# Patient Record
Sex: Female | Born: 1960
Health system: Southern US, Community
[De-identification: ages and names within clinical notes are randomized; demographics above are authoritative.]

## PROBLEM LIST (undated history)

## (undated) DIAGNOSIS — E2839 Other primary ovarian failure: Secondary | ICD-10-CM

## (undated) DIAGNOSIS — R0602 Shortness of breath: Secondary | ICD-10-CM

## (undated) DIAGNOSIS — E079 Disorder of thyroid, unspecified: Secondary | ICD-10-CM

## (undated) DIAGNOSIS — K760 Fatty (change of) liver, not elsewhere classified: Secondary | ICD-10-CM

## (undated) DIAGNOSIS — Z780 Asymptomatic menopausal state: Secondary | ICD-10-CM

## (undated) DIAGNOSIS — N390 Urinary tract infection, site not specified: Secondary | ICD-10-CM

## (undated) DIAGNOSIS — T7840XA Allergy, unspecified, initial encounter: Secondary | ICD-10-CM

## (undated) DIAGNOSIS — R7303 Prediabetes: Secondary | ICD-10-CM

## (undated) DIAGNOSIS — N979 Female infertility, unspecified: Secondary | ICD-10-CM

## (undated) DIAGNOSIS — J45909 Unspecified asthma, uncomplicated: Secondary | ICD-10-CM

## (undated) HISTORY — DX: Female infertility, unspecified: N97.9

## (undated) HISTORY — DX: Unspecified asthma, uncomplicated: J45.909

## (undated) HISTORY — DX: Disorder of thyroid, unspecified: E07.9

## (undated) HISTORY — DX: Fatty (change of) liver, not elsewhere classified: K76.0

## (undated) HISTORY — DX: Other primary ovarian failure: E28.39

## (undated) HISTORY — DX: Shortness of breath: R06.02

## (undated) HISTORY — DX: Allergy, unspecified, initial encounter: T78.40XA

## (undated) HISTORY — DX: Asymptomatic menopausal state: Z78.0

## (undated) HISTORY — DX: Urinary tract infection, site not specified: N39.0

## (undated) HISTORY — DX: Prediabetes: R73.03

## (undated) HISTORY — PX: COLONOSCOPY: SHX174

---

## 2009-10-24 ENCOUNTER — Ambulatory Visit: Payer: Self-pay | Admitting: Family Medicine

## 2010-03-07 ENCOUNTER — Ambulatory Visit: Payer: Self-pay | Admitting: Gastroenterology

## 2011-01-21 ENCOUNTER — Ambulatory Visit: Payer: Self-pay | Admitting: Family Medicine

## 2011-08-07 ENCOUNTER — Ambulatory Visit (INDEPENDENT_AMBULATORY_CARE_PROVIDER_SITE_OTHER): Payer: BC Managed Care – PPO | Admitting: Internal Medicine

## 2011-08-07 ENCOUNTER — Encounter: Payer: Self-pay | Admitting: Internal Medicine

## 2011-08-07 VITALS — BP 118/60 | HR 67 | Temp 98.1°F | Resp 16 | Ht 63.5 in | Wt 185.2 lb

## 2011-08-07 DIAGNOSIS — E669 Obesity, unspecified: Secondary | ICD-10-CM

## 2011-08-07 DIAGNOSIS — D709 Neutropenia, unspecified: Secondary | ICD-10-CM | POA: Insufficient documentation

## 2011-08-07 DIAGNOSIS — E039 Hypothyroidism, unspecified: Secondary | ICD-10-CM

## 2011-08-07 NOTE — Progress Notes (Signed)
Patient ID: Samantha Raymond, female   DOB: [birth year], 51 y.o.   MRN: [id number] Patient Active Problem List  Diagnosis  . Neutropenia  . Recurrent urinary tract infection  . Obesity (BMI 30-39.9)  . Hypothyroidism    Subjective:  CC:   Chief Complaint  Patient presents with  . New Patient    HPI:   Samantha Raymond is a 51 y.o. female who presents as a new patient to establish primary care with the chief complaint of   Past Medical History  Diagnosis Date  . hypothyroid   . Recurrent urinary tract infection     History reviewed. No pertinent past surgical history.  Family History  Problem Relation Age of Onset  . Asthma Mother   . Diabetes Father   . Heart disease Father     chf,  no history of AMI  . Hyperlipidemia Father   . Cancer Sister 18    breast cancer   . Arthritis Sister   . Asthma Brother   . Stroke Maternal Aunt   . Cancer Maternal Aunt     breast ca    History   Social History  . Marital Status: Married    Spouse Name: N/A    Number of Children: N/A  . Years of Education: N/A   Occupational History  . Not on file.   Social History Main Topics  . Smoking status: Never Smoker   . Smokeless tobacco: Never Used  . Alcohol Use: Yes  . Drug Use: No  . Sexually Active: Not on file   Other Topics Concern  . Not on file   Social History Narrative  . No narrative on file    Allergies  Allergen Reactions  . Sulfa Antibiotics Nausea And Vomiting   Review of Systems: Review of Systems - General ROS: negative except for those mentioned in HPI   Objective:  BP 118/60  Pulse 67  Temp 98.1 F (36.7 C) (Oral)  Resp 16  Ht 5' 3.5" (1.613 m)  Wt 185 lb 4 oz (84.029 kg)  BMI 32.30 kg/m2  SpO2 97%  General appearance: alert, cooperative and appears stated age Ears: normal TM's and external ear canals both ears Throat: lips, mucosa, and tongue normal; teeth and gums normal Neck: no adenopathy, no carotid bruit, supple, symmetrical,  trachea midline and thyroid not enlarged, symmetric, no tenderness/mass/nodules Back: symmetric, no curvature. ROM normal. No CVA tenderness. Lungs: clear to auscultation bilaterally Heart: regular rate and rhythm, S1, S2 normal, no murmur, click, rub or gallop Abdomen: soft, non-tender; bowel sounds normal; no masses,  no organomegaly Pulses: 2+ and symmetric Skin: Skin color, texture, turgor normal. No rashes or lesions Lymph nodes: Cervical, supraclavicular, and axillary nodes normal.  Assessment and Plan:  Obesity (BMI 30-39.9) I have addressed  BMI and recommended a low glycemic index diet utilizing smaller more frequent meals to increase metabolism.  I have also recommended that patient start exercising with a goal of 30 minutes of aerobic exercise a minimum of 5 days per week. Screening for lipid disorders, thyroid and diabetes  Has been done per review of prior labs.    Neutropenia New onset, in the setting of several bacterial infections from march through April .  Review of previous records from [place] a low normal pre infectious CBC and an inappropriately normal during an episode of diverticulitis.  We discussed repeating a CBC in a few weeks to cinform neutropenia and if low will consider checking Ig levels.  Updated Medication List Outpatient Encounter Prescriptions as of 08/07/2011  Medication Sig Dispense Refill  . Calcium-Vitamin D-Vitamin K (VIACTIV PO) Take by mouth.      . escitalopram (LEXAPRO) 10 MG tablet Take 10 mg by mouth daily.      Marland Kitchen levothyroxine (SYNTHROID, LEVOTHROID) 50 MCG tablet Take 50 mcg by mouth daily. 1/2 tablet on Saturday and Sunday      . Multiple Vitamin (MULTIVITAMIN) tablet Take 1 tablet by mouth daily.         Orders Placed This Encounter  Procedures  . HM MAMMOGRAPHY  . HM COLONOSCOPY    No Follow-up on file.

## 2011-08-07 NOTE — Patient Instructions (Addendum)
Consider the Low Glycemic Index/Mediterranean  Diet and 6 smaller meals daily .  This boosts your metabolism and regulates your sugars:   7 AM Low carbohydrate Protein  Shakes (EAS Carb Control  Or Atkins ,  Available everywhere,   In  cases at BJs )  2.5 carbs  (Add or substitute a toasted sandwhich thin w/ peanut butter)  10 AM: Protein bar by Atkins (snack size,  Chocolate lover's variety at  BJ's)    Lunch: sandwich on pita bread or flatbread (Joseph's makes a pita bread and a flat bread , available at [employer] and BJ's; Toufayah makes a low carb flatbread available at [employer] and [employer]) Mission makes a low carb whole wheat tortilla available at [employer] most grocery stores   3 PM:  Mid day :  Another protein bar,  Or a  cheese stick, 1/4 cup of almonds, walnuts, pistachios, pecans, peanuts,  Macadamia nuts  6 PM  Dinner:  "mean and green:"  Meat/chicken/fish, salad, and green veggie : use ranch, vinagrette,  Blue cheese, etc  9 PM snack : Breyer's low carb fudgsicle or  ice cream bar (Carb Smart), or  Weight Watcher's ice cream bar , or another protein shake  [place] yogurt , as long as it is is < 20 calories,, is a great substitute  ____________________________________________________________________________________________________-----  We should recheck a CBC and a TSH in early July.

## 2011-08-09 ENCOUNTER — Encounter: Payer: Self-pay | Admitting: Internal Medicine

## 2011-08-09 DIAGNOSIS — E669 Obesity, unspecified: Secondary | ICD-10-CM | POA: Insufficient documentation

## 2011-08-09 DIAGNOSIS — E039 Hypothyroidism, unspecified: Secondary | ICD-10-CM | POA: Insufficient documentation

## 2011-08-09 DIAGNOSIS — N390 Urinary tract infection, site not specified: Secondary | ICD-10-CM | POA: Insufficient documentation

## 2011-08-09 NOTE — Assessment & Plan Note (Signed)
New onset, in the setting of several bacterial infections from march through April .  Review of previous records from [place] a low normal pre infectious CBC and an inappropriately normal during an episode of diverticulitis.  We discussed repeating a CBC in a few weeks to cinform neutropenia and if low will consider checking Ig levels.

## 2011-08-09 NOTE — Assessment & Plan Note (Addendum)
I have addressed  BMI and recommended a low glycemic index diet utilizing smaller more frequent meals to increase metabolism.  I have also recommended that patient start exercising with a goal of 30 minutes of aerobic exercise a minimum of 5 days per week. Screening for lipid disorders, thyroid and diabetes  Has been done per review of prior labs.

## 2011-08-26 ENCOUNTER — Telehealth: Payer: Self-pay | Admitting: Internal Medicine

## 2011-08-26 NOTE — Telephone Encounter (Signed)
Cbc was normal,  Her thyroid was overactive on current dose of synthroid,.  I suggest continuing 50 mcg daily but skipping two days per week (only taking nit 5 days per week)

## 2011-08-27 NOTE — Telephone Encounter (Signed)
Patient advised as instructed via telephone, she wants to know when to return for labs to check thyroid again?  Please advise.

## 2011-08-27 NOTE — Telephone Encounter (Signed)
Left message asking patient to return my call.

## 2011-08-27 NOTE — Telephone Encounter (Signed)
Left detailed message notifying patient.

## 2011-08-28 NOTE — Telephone Encounter (Signed)
Patient notified

## 2011-08-28 NOTE — Telephone Encounter (Signed)
Changes in thyroid medications require minimum of 6 weeks for recheck , please order tsh and free t4  thanks

## 2011-10-06 ENCOUNTER — Other Ambulatory Visit: Payer: Self-pay | Admitting: *Deleted

## 2011-10-06 MED ORDER — LEVOTHYROXINE SODIUM 50 MCG PO TABS: 50.0000 ug | ORAL_TABLET | Freq: Every day | ORAL | Status: DC

## 2012-07-21 ENCOUNTER — Other Ambulatory Visit: Payer: Self-pay | Admitting: *Deleted

## 2012-07-21 MED ORDER — LEVOTHYROXINE SODIUM 50 MCG PO TABS: 50.0000 ug | ORAL_TABLET | Freq: Every day | ORAL | Status: DC

## 2012-08-17 ENCOUNTER — Encounter: Payer: Self-pay | Admitting: Internal Medicine

## 2012-08-17 ENCOUNTER — Ambulatory Visit (INDEPENDENT_AMBULATORY_CARE_PROVIDER_SITE_OTHER): Payer: BC Managed Care – PPO | Admitting: Internal Medicine

## 2012-08-17 VITALS — BP 118/70 | HR 61 | Temp 98.1°F | Resp 14 | Ht 64.0 in | Wt 192.5 lb

## 2012-08-17 DIAGNOSIS — Z1239 Encounter for other screening for malignant neoplasm of breast: Secondary | ICD-10-CM

## 2012-08-17 DIAGNOSIS — N951 Menopausal and female climacteric states: Secondary | ICD-10-CM

## 2012-08-17 DIAGNOSIS — Z0000 Encounter for general adult medical examination without abnormal findings: Secondary | ICD-10-CM

## 2012-08-17 DIAGNOSIS — Z780 Asymptomatic menopausal state: Secondary | ICD-10-CM | POA: Insufficient documentation

## 2012-08-17 NOTE — Patient Instructions (Addendum)
We will do your PAP next year.   Labs at [place] ASAP (fasting)  This is  One version of a  "Low GI"  Diet:  It will still lower your blood sugars and allow you to lose 4 to 8  lbs  per month if you follow it carefully.  Your goal with exercise is a minimum of 30 minutes of aerobic exercise 5 days per week (Walking does not count once it becomes easy!)    All of the foods can be found at grocery stores and in bulk at [employer].  The Atkins protein bars and shakes are available in more varieties at Target, WalMart and [employer] Foods.     7 AM Breakfast:  Choose from the following:  Low carbohydrate Protein  Shakes (I recommend the EAS AdvantEdge "Carb Control" shakes  Or the low carb shakes by Atkins.    2.5 carbs   Arnold's "Sandwhich Thin"toasted  w/ peanut butter (no jelly: about 20 net carbs  "Bagel Thin" with cream cheese and salmon: about 20 carbs   a scrambled egg/bacon/cheese burrito made with Mission's "carb balance" whole wheat tortilla  (about 10 net carbs )   Avoid cereal and bananas, oatmeal and cream of wheat and grits. They are loaded with carbohydrates!   10 AM: high protein snack  Protein bar by Atkins (the snack size, under 200 cal, usually < 6 net carbs).    A stick of cheese:  Around 1 carb,  100 cal     Dannon Light n Fit [place] Yogurt  (80 cal, 8 carbs)  Other so called "protein bars" and Greek yogurts tend to be loaded with carbohydrates.  Remember, in food advertising, the word "energy" is synonymous for " carbohydrate."  Lunch:   A Sandwich using the bread choices listed, Can use any  Eggs,  lunchmeat, grilled meat or canned tuna), avocado, regular mayo/mustard  and cheese.  A Salad using blue cheese, ranch,  Goddess or vinagrette,  No croutons or "confetti" and no "candied nuts" but regular nuts OK.   No pretzels or chips.  Pickles and miniature sweet peppers are a good low carb alternative that provide a "crunch"  The bread is the only source of carbohydrate in a  sandwich and  can be decreased by trying some of these alternatives to traditional loaf bread  Joseph's makes a pita bread and a flat bread that are 50 cal and 4 net carbs available at BJs and WalMart.  This can be toasted to use with hummous as well  Toufayan makes a low carb flatbread that's 100 cal and 9 net carbs available at [employer] and [employer] makes 2 sizes of  Low carb whole wheat tortilla  (The large one is 210 cal and 6 net carbs) Avoid "Low fat dressings, as well as Reyne Dumas and [address] dressings They are loaded with sugar!   3 PM/ Mid day  Snack:  Consider  1 ounce of  almonds, walnuts, pistachios, pecans, peanuts,  Macadamia nuts or a nut medley.  Avoid "granola"; the dried cranberries and raisins are loaded with carbohydrates. Mixed nuts as long as there are no raisins,  cranberries or dried fruit.     6 PM  Dinner:     Meat/fowl/fish with a green salad, and either broccoli, cauliflower, green beans, spinach, brussel sprouts or  Lima beans. DO NOT BREAD THE PROTEIN!!      There is a low carb pasta by Dreamfield's that is  acceptable and tastes great: only 5 digestible carbs/serving.( All grocery stores but BJs carry it )  Try Kai Levins Angelo's chicken piccata or chicken or eggplant parm over low carb pasta.(Lowes and BJs)   Clifton Custard Sanchez's "Carnitas" (pulled pork, no sauce,  0 carbs) or his beef pot roast to make a dinner burrito (at BJ's)  Pesto over low carb pasta (bj's sells a good quality pesto in the center refrigerated section of the deli   Whole wheat pasta is still full of digestible carbs and  Not as low in glycemic index as Dreamfield's.   Brown rice is still rice,  So skip the rice and noodles if you eat [place] or [place] (or at least limit to 1/2 cup)  9 PM snack :   Breyer's "low carb" fudgsicle or  ice cream bar (Carb Smart line), or  Weight Watcher's ice cream bar , or another "no sugar added" ice cream;  a serving of fresh berries/cherries with whipped  cream   Cheese or DANNON'S [employer] [place] YOGURT  Avoid bananas, pineapple, grapes  and watermelon on a regular basis because they are high in sugar.  THINK OF THEM AS DESSERT  Remember that snack Substitutions should be less than 10 NET carbs per serving and meals < 20 carbs. Remember to subtract fiber grams to get the "net carbs."

## 2012-08-20 ENCOUNTER — Encounter: Payer: Self-pay | Admitting: Internal Medicine

## 2012-08-20 DIAGNOSIS — Z0000 Encounter for general adult medical examination without abnormal findings: Secondary | ICD-10-CM | POA: Insufficient documentation

## 2012-08-20 NOTE — Assessment & Plan Note (Signed)
Annual comprehensive exam was done including breast, pelvic without PAP smear. All screenings have been addressed .  

## 2012-08-20 NOTE — Progress Notes (Signed)
Patient ID: Samantha Raymond, female   DOB: 12-Jan-1961, 52 y.o.   MRN: [id number]  Subjective:     Samantha Raymond is a 52 y.o. female and is here for a comprehensive physical exam. The patient reports no problems.  History   Social History  . Marital Status: Married    Spouse Name: N/A    Number of Children: N/A  . Years of Education: N/A   Occupational History  . Not on file.   Social History Main Topics  . Smoking status: Never Smoker   . Smokeless tobacco: Never Used  . Alcohol Use: Yes  . Drug Use: No  . Sexually Active: Not on file   Other Topics Concern  . Not on file   Social History Narrative  . No narrative on file   Health Maintenance  Topic Date Due  . Pap Smear  03/22/1977  . Influenza Vaccine  10/17/2012  . Mammogram  01/20/2013  . Colonoscopy  03/07/2020  . Tetanus/tdap  05/08/2020    The following portions of the patient's history were reviewed and updated as appropriate: allergies, current medications, past family history, past medical history, past social history, past surgical history and problem list.  Review of Systems A comprehensive review of systems was negative.   Objective:   BP 118/70  Pulse 61  Temp(Src) 98.1 F (36.7 C) (Oral)  Resp 14  Ht 5\' 4"  (1.626 m)  Wt 192 lb 8 oz (87.317 kg)  BMI 33.03 kg/m2  SpO2 98%  General Appearance:    Alert, cooperative, no distress, appears stated age  Head:    Normocephalic, without obvious abnormality, atraumatic  Eyes:    PERRL, conjunctiva/corneas clear, EOM's intact, fundi    benign, both eyes  Ears:    Normal TM's and external ear canals, both ears  Nose:   Nares normal, septum midline, mucosa normal, no drainage    or sinus tenderness  Throat:   Lips, mucosa, and tongue normal; teeth and gums normal  Neck:   Supple, symmetrical, trachea midline, no adenopathy;    thyroid:  no enlargement/tenderness/nodules; no carotid   bruit or JVD  Back:     Symmetric, no curvature, ROM normal, no  CVA tenderness  Lungs:     Clear to auscultation bilaterally, respirations unlabored  Chest Wall:    No tenderness or deformity   Heart:    Regular rate and rhythm, S1 and S2 normal, no murmur, rub   or gallop  Breast Exam:    No tenderness, masses, or nipple abnormality  Abdomen:     Soft, non-tender, bowel sounds active all four quadrants,    no masses, no organomegaly  Genitalia:    Pelvic: cervix normal in appearance, external genitalia normal, no adnexal masses or tenderness, no cervical motion tenderness, rectovaginal septum normal, uterus normal size, shape, and consistency and vagina normal without discharge  Extremities:   Extremities normal, atraumatic, no cyanosis or edema  Pulses:   2+ and symmetric all extremities  Skin:   Skin color, texture, turgor normal, no rashes or lesions  Lymph nodes:   Cervical, supraclavicular, and axillary nodes normal  Neurologic:   CNII-XII intact, normal strength, sensation and reflexes    throughout      Assessment:   Routine general medical examination at a health care facility Annual comprehensive exam was done including breast,pelvic without  PAP smear. All screenings have been addressed .    Updated Medication List Outpatient Encounter Prescriptions as of 08/17/2012  Medication  Sig Dispense Refill  . Calcium-Vitamin D-Vitamin K (VIACTIV PO) Take by mouth.      . escitalopram (LEXAPRO) 10 MG tablet Take 10 mg by mouth daily.      Marland Kitchen levothyroxine (SYNTHROID, LEVOTHROID) 50 MCG tablet Take 50 mcg by mouth daily. 1 tablet Monday - Friday      . Multiple Vitamin (MULTIVITAMIN) tablet Take 1 tablet by mouth daily.      . [DISCONTINUED] levothyroxine (SYNTHROID, LEVOTHROID) 50 MCG tablet Take 1 tablet (50 mcg total) by mouth daily. 1/2 tablet on Saturday and Sunday  30 tablet  0   No facility-administered encounter medications on file as of 08/17/2012.

## 2012-08-24 ENCOUNTER — Telehealth: Payer: Self-pay | Admitting: Internal Medicine

## 2012-08-24 DIAGNOSIS — E559 Vitamin D deficiency, unspecified: Secondary | ICD-10-CM | POA: Insufficient documentation

## 2012-08-24 MED ORDER — ERGOCALCIFEROL 1.25 MG (50000 UT) PO CAPS: 50000.0000 [IU] | ORAL_CAPSULE | ORAL | Status: DC

## 2012-08-24 NOTE — Telephone Encounter (Signed)
All labs normal,. Including thyroid function and cholesterol . Vit d is a little low   Will recommend Drisdol weekly for 4 weeks, then resuem or start cit d3 1000 units daily.

## 2012-08-25 NOTE — Telephone Encounter (Signed)
Patient notified as requested. 

## 2012-08-31 ENCOUNTER — Other Ambulatory Visit: Payer: Self-pay | Admitting: *Deleted

## 2012-08-31 MED ORDER — LEVOTHYROXINE SODIUM 50 MCG PO TABS: 50.0000 ug | ORAL_TABLET | Freq: Every day | ORAL | Status: DC

## 2012-09-08 ENCOUNTER — Ambulatory Visit
Admission: RE | Admit: 2012-09-08 | Discharge: 2012-09-08 | Disposition: A | Discharge status: Home / Self Care | Payer: BC Managed Care – PPO | Source: Ambulatory Visit | Attending: Internal Medicine | Admitting: Internal Medicine

## 2012-09-08 DIAGNOSIS — Z1239 Encounter for other screening for malignant neoplasm of breast: Secondary | ICD-10-CM

## 2012-09-30 ENCOUNTER — Encounter: Payer: Self-pay | Admitting: Internal Medicine

## 2012-10-11 ENCOUNTER — Other Ambulatory Visit: Payer: Self-pay | Admitting: *Deleted

## 2012-10-11 MED ORDER — ESCITALOPRAM OXALATE 10 MG PO TABS: 10.0000 mg | ORAL_TABLET | Freq: Every day | ORAL | Status: DC

## 2012-12-22 ENCOUNTER — Other Ambulatory Visit: Payer: Self-pay

## 2013-01-09 ENCOUNTER — Encounter: Payer: Self-pay | Admitting: Internal Medicine

## 2013-01-18 ENCOUNTER — Other Ambulatory Visit: Payer: Self-pay | Admitting: Internal Medicine

## 2013-05-16 ENCOUNTER — Other Ambulatory Visit: Payer: Self-pay | Admitting: Internal Medicine

## 2013-08-30 ENCOUNTER — Other Ambulatory Visit: Payer: Self-pay | Admitting: Internal Medicine

## 2013-08-30 NOTE — Telephone Encounter (Signed)
Last refill 6.15.15, last OV 7.2.15, future OV 9.18.15.  Please advise refill.

## 2013-08-31 DIAGNOSIS — J45909 Unspecified asthma, uncomplicated: Secondary | ICD-10-CM | POA: Insufficient documentation

## 2013-09-01 NOTE — Telephone Encounter (Signed)
Correction,  Last office visit  July 2014.  No refills until seen

## 2013-09-06 ENCOUNTER — Encounter: Payer: Self-pay | Admitting: Internal Medicine

## 2013-09-06 ENCOUNTER — Ambulatory Visit (INDEPENDENT_AMBULATORY_CARE_PROVIDER_SITE_OTHER): Payer: BC Managed Care – PPO | Admitting: Internal Medicine

## 2013-09-06 VITALS — BP 128/66 | HR 56 | Temp 98.8°F | Resp 18 | Ht 64.0 in | Wt 199.2 lb

## 2013-09-06 DIAGNOSIS — D709 Neutropenia, unspecified: Secondary | ICD-10-CM

## 2013-09-06 DIAGNOSIS — R5383 Other fatigue: Secondary | ICD-10-CM

## 2013-09-06 DIAGNOSIS — E0789 Other specified disorders of thyroid: Secondary | ICD-10-CM

## 2013-09-06 DIAGNOSIS — E785 Hyperlipidemia, unspecified: Secondary | ICD-10-CM

## 2013-09-06 DIAGNOSIS — R5382 Chronic fatigue, unspecified: Secondary | ICD-10-CM

## 2013-09-06 DIAGNOSIS — E034 Atrophy of thyroid (acquired): Secondary | ICD-10-CM

## 2013-09-06 DIAGNOSIS — E038 Other specified hypothyroidism: Secondary | ICD-10-CM

## 2013-09-06 DIAGNOSIS — E669 Obesity, unspecified: Secondary | ICD-10-CM

## 2013-09-06 DIAGNOSIS — E039 Hypothyroidism, unspecified: Secondary | ICD-10-CM

## 2013-09-06 DIAGNOSIS — R5381 Other malaise: Secondary | ICD-10-CM

## 2013-09-06 LAB — COMPREHENSIVE METABOLIC PANEL
ALT: 56 U/L — AB (ref 0–35)
AST: 44 U/L — ABNORMAL HIGH (ref 0–37)
Albumin: 4.2 g/dL (ref 3.5–5.2)
Alkaline Phosphatase: 80 U/L (ref 39–117)
BILIRUBIN TOTAL: 0.9 mg/dL (ref 0.2–1.2)
BUN: 11 mg/dL (ref 6–23)
CO2: 28 meq/L (ref 19–32)
CREATININE: 0.8 mg/dL (ref 0.4–1.2)
Calcium: 9.4 mg/dL (ref 8.4–10.5)
Chloride: 105 mEq/L (ref 96–112)
GFR: 82.89 mL/min (ref >60.00)
GLUCOSE: 93 mg/dL (ref 70–99)
Potassium: 4.6 mEq/L (ref 3.5–5.1)
Sodium: 139 mEq/L (ref 135–145)
Total Protein: 6.7 g/dL (ref 6.0–8.3)

## 2013-09-06 LAB — LIPID PANEL
CHOLESTEROL: 204 mg/dL — AB (ref 0–200)
HDL: 61.6 mg/dL (ref >39.00)
LDL Cholesterol: 122 mg/dL — ABNORMAL HIGH (ref 0–99)
NonHDL: 142.4
TRIGLYCERIDES: 104 mg/dL (ref 0.0–149.0)
Total CHOL/HDL Ratio: 3
VLDL: 20.8 mg/dL (ref 0.0–40.0)

## 2013-09-06 LAB — TSH: TSH: 0.2 u[IU]/mL — AB (ref 0.35–4.50)

## 2013-09-06 MED ORDER — ALBUTEROL SULFATE HFA 108 (90 BASE) MCG/ACT IN AERS
2.0000 | INHALATION_SPRAY | Freq: Four times a day (QID) | RESPIRATORY_TRACT | Status: DC | PRN
Start: 2013-09-06 — End: 2016-01-24

## 2013-09-06 MED ORDER — FLUTICASONE-SALMETEROL 100-50 MCG/DOSE IN AEPB: 1.0000 | INHALATION_SPRAY | Freq: Two times a day (BID) | RESPIRATORY_TRACT | Status: DC

## 2013-09-06 MED ORDER — ESCITALOPRAM OXALATE 10 MG PO TABS: 10.0000 mg | ORAL_TABLET | Freq: Every day | ORAL | Status: DC

## 2013-09-06 NOTE — Progress Notes (Signed)
Patient ID: Samantha Raymond, female   DOB: 09/08/1960, 53 y.o.   MRN: [id number]  Patient Active Problem List   Diagnosis Date Noted  . Unspecified vitamin D deficiency 08/24/2012  . Routine general medical examination at a health care facility 08/20/2012  . Menopause 08/17/2012  . Obesity (BMI 30-39.9) 08/09/2011  . Hypothyroidism 08/09/2011  . Recurrent urinary tract infection   . Neutropenia 08/07/2011    Subjective:  CC:   Chief Complaint  Patient presents with  . Follow-up    medication refills  . Hypothyroidism    HPI:   Samantha Raymond is a 53 y.o. female who presents for  1 yr follow up on hypothyroidism and obesity  She has not had tsh checked in over a yr.  She has had no change in energy level or signs and symptoms of underactive or overactive thyroid.   She is not losing weight but is not really trying.    Wants to refill all meds by Friday for 90 days in order to use her healthcare spending account since she is changing jobs  advair 90 days  And pro air one inhlaer sent to [place]    Past Medical History  Diagnosis Date  . hypothyroid   . Recurrent urinary tract infection     No past surgical history on file.     The following portions of the patient's history were reviewed and updated as appropriate: Allergies, current medications, and problem list.    Review of Systems:   Patient denies headache, fevers, malaise, unintentional weight loss, skin rash, eye pain, sinus congestion and sinus pain, sore throat, dysphagia,  hemoptysis , cough, dyspnea, wheezing, chest pain, palpitations, orthopnea, edema, abdominal pain, nausea, melena, diarrhea, constipation, flank pain, dysuria, hematuria, urinary  Frequency, nocturia, numbness, tingling, seizures,  Focal weakness, Loss of consciousness,  Tremor, insomnia, depression, anxiety, and suicidal ideation.     History   Social History  . Marital Status: Married    Spouse Name: N/A    Number of  Children: N/A  . Years of Education: N/A   Occupational History  . Not on file.   Social History Main Topics  . Smoking status: Never Smoker   . Smokeless tobacco: Never Used  . Alcohol Use: Yes  . Drug Use: No  . Sexual Activity: Not on file   Other Topics Concern  . Not on file   Social History Narrative  . No narrative on file    Objective:  Filed Vitals:   09/06/13 1015  BP: 128/66  Pulse: 56  Temp: 98.8 F (37.1 C)  Resp: 18     General appearance: alert, cooperative and appears stated age Ears: normal TM's and external ear canals both ears Throat: lips, mucosa, and tongue normal; teeth and gums normal Neck: no adenopathy, no carotid bruit, supple, symmetrical, trachea midline and thyroid not enlarged, symmetric, no tenderness/mass/nodules Back: symmetric, no curvature. ROM normal. No CVA tenderness. Lungs: clear to auscultation bilaterally Heart: regular rate and rhythm, S1, S2 normal, no murmur, click, rub or gallop Abdomen: soft, non-tender; bowel sounds normal; no masses,  no organomegaly Pulses: 2+ and symmetric Skin: Skin color, texture, turgor normal. No rashes or lesions Lymph nodes: Cervical, supraclavicular, and axillary nodes normal.  Assessment and Plan:  Obesity (BMI 30-39.9) 14 lb wt gain in 2 years.  I have addressed  BMI and recommended wt loss of 10% of body weigh over the next 6 months using a low glycemic index diet and  regular exercise a minimum of 5 days per week.    Hypothyroidism Thyroid function is overactive on current dose.  I have sent a lower dose of levothyroxine to her pharmacy and would like patient to change immediately.  Lab Results  Component Value Date   TSH 0.20* 09/06/2013       Neutropenia New onset, in the setting of several bacterial infections from march through April in 2014.  Review of previous records from [place] a low normal pre infectious CBC and an inappropriately normal during an episode of  diverticulitis.  However she has had not infections in the last year.   No results found for this basename: WBC, HGB, HCT, MCV, PLT      Updated Medication List Outpatient Encounter Prescriptions as of 09/06/2013  Medication Sig  . Calcium-Vitamin D-Vitamin K (VIACTIV PO) Take by mouth.  . Cholecalciferol (VITAMIN D3) 1000 UNITS CAPS Take 1 capsule by mouth daily.  Marland Kitchen escitalopram (LEXAPRO) 10 MG tablet Take 1 tablet (10 mg total) by mouth daily.  Marland Kitchen levothyroxine (SYNTHROID, LEVOTHROID) 25 MCG tablet Take 1 tablet (25 mcg total) by mouth daily. 1 tablet Monday - Friday  . Multiple Vitamin (MULTIVITAMIN) tablet Take 1 tablet by mouth daily.  . [DISCONTINUED] escitalopram (LEXAPRO) 10 MG tablet TAKE 1 TABLET BY MOUTH DAILY  . [DISCONTINUED] levothyroxine (SYNTHROID, LEVOTHROID) 50 MCG tablet Take 1 tablet (50 mcg total) by mouth daily. 1 tablet Monday - Friday  . albuterol (PROVENTIL HFA;VENTOLIN HFA) 108 (90 BASE) MCG/ACT inhaler Inhale 2 puffs into the lungs every 6 (six) hours as needed for wheezing or shortness of breath.  . Fluticasone-Salmeterol (ADVAIR DISKUS) 100-50 MCG/DOSE AEPB Inhale 1 puff into the lungs 2 (two) times daily.  . [DISCONTINUED] ergocalciferol (DRISDOL) 50000 UNITS capsule Take 1 capsule (50,000 Units total) by mouth once a week.     Orders Placed This Encounter  Procedures  . TSH  . Comprehensive metabolic panel  . Lipid panel    No Follow-up on file.

## 2013-09-06 NOTE — Patient Instructions (Signed)
I will refill your synthroid ASAP penidng your TSH level today  90 days on  Advair (3 inhalers ) and one on Proair sent to [facility]  Will refill lexapro and synthroid for 90 days

## 2013-09-06 NOTE — Progress Notes (Signed)
Pre-visit discussion using our clinic review tool. No additional management support is needed unless otherwise documented below in the visit note.  

## 2013-09-07 ENCOUNTER — Encounter: Payer: Self-pay | Admitting: Internal Medicine

## 2013-09-07 MED ORDER — LEVOTHYROXINE SODIUM 25 MCG PO TABS: 25.0000 ug | ORAL_TABLET | Freq: Every day | ORAL | Status: DC

## 2013-09-08 NOTE — Assessment & Plan Note (Signed)
14 lb wt gain in 2 years.  I have addressed  BMI and recommended wt loss of 10% of body weigh over the next 6 months using a low glycemic index diet and regular exercise a minimum of 5 days per week.

## 2013-09-08 NOTE — Assessment & Plan Note (Signed)
Thyroid function is overactive on current dose.  I have sent a lower dose of levothyroxine to her pharmacy and would like patient to change immediately.  Lab Results  Component Value Date   TSH 0.20* 09/06/2013

## 2013-09-08 NOTE — Assessment & Plan Note (Signed)
New onset, in the setting of several bacterial infections from march through April in 2014.  Review of previous records from [place] a low normal pre infectious CBC and an inappropriately normal during an episode of diverticulitis.  However she has had not infections in the last year.   No results found for this basename: WBC, HGB, HCT, MCV, PLT

## 2013-09-22 MED ORDER — ESCITALOPRAM OXALATE 10 MG PO TABS: 10.0000 mg | ORAL_TABLET | Freq: Every day | ORAL | Status: DC

## 2013-09-22 NOTE — Addendum Note (Signed)
Addended by: Crecencio Mc on: 09/22/2013 07:40 AM   Modules accepted: Orders

## 2013-09-22 NOTE — Telephone Encounter (Signed)
lexapro refilled since she has made an appt

## 2013-11-03 ENCOUNTER — Ambulatory Visit (INDEPENDENT_AMBULATORY_CARE_PROVIDER_SITE_OTHER): Payer: 59 | Admitting: Internal Medicine

## 2013-11-03 ENCOUNTER — Other Ambulatory Visit (HOSPITAL_COMMUNITY)
Admission: RE | Admit: 2013-11-03 | Discharge: 2013-11-03 | Disposition: A | Discharge status: Home / Self Care | Payer: 59 | Source: Ambulatory Visit | Attending: Internal Medicine | Admitting: Internal Medicine

## 2013-11-03 ENCOUNTER — Encounter: Payer: Self-pay | Admitting: Internal Medicine

## 2013-11-03 VITALS — BP 122/76 | HR 64 | Temp 98.8°F | Resp 16 | Ht 64.0 in | Wt 197.5 lb

## 2013-11-03 DIAGNOSIS — E034 Atrophy of thyroid (acquired): Secondary | ICD-10-CM

## 2013-11-03 DIAGNOSIS — Z0000 Encounter for general adult medical examination without abnormal findings: Secondary | ICD-10-CM

## 2013-11-03 DIAGNOSIS — Z1239 Encounter for other screening for malignant neoplasm of breast: Secondary | ICD-10-CM

## 2013-11-03 DIAGNOSIS — Z1151 Encounter for screening for human papillomavirus (HPV): Secondary | ICD-10-CM | POA: Diagnosis present

## 2013-11-03 DIAGNOSIS — R748 Abnormal levels of other serum enzymes: Secondary | ICD-10-CM

## 2013-11-03 DIAGNOSIS — Z1231 Encounter for screening mammogram for malignant neoplasm of breast: Secondary | ICD-10-CM

## 2013-11-03 DIAGNOSIS — E038 Other specified hypothyroidism: Secondary | ICD-10-CM

## 2013-11-03 DIAGNOSIS — Z124 Encounter for screening for malignant neoplasm of cervix: Secondary | ICD-10-CM

## 2013-11-03 DIAGNOSIS — D709 Neutropenia, unspecified: Secondary | ICD-10-CM

## 2013-11-03 DIAGNOSIS — E039 Hypothyroidism, unspecified: Secondary | ICD-10-CM

## 2013-11-03 DIAGNOSIS — Z01419 Encounter for gynecological examination (general) (routine) without abnormal findings: Secondary | ICD-10-CM | POA: Insufficient documentation

## 2013-11-03 DIAGNOSIS — E0789 Other specified disorders of thyroid: Secondary | ICD-10-CM

## 2013-11-03 DIAGNOSIS — E669 Obesity, unspecified: Secondary | ICD-10-CM

## 2013-11-03 LAB — COMPREHENSIVE METABOLIC PANEL
ALT: 48 U/L — ABNORMAL HIGH (ref 0–35)
AST: 40 U/L — ABNORMAL HIGH (ref 0–37)
Albumin: 4.4 g/dL (ref 3.5–5.2)
Alkaline Phosphatase: 89 U/L (ref 39–117)
BILIRUBIN TOTAL: 0.8 mg/dL (ref 0.2–1.2)
BUN: 13 mg/dL (ref 6–23)
CALCIUM: 9.4 mg/dL (ref 8.4–10.5)
CO2: 29 meq/L (ref 19–32)
CREATININE: 0.8 mg/dL (ref 0.4–1.2)
Chloride: 105 mEq/L (ref 96–112)
GFR: 77.04 mL/min (ref >60.00)
GLUCOSE: 109 mg/dL — AB (ref 70–99)
Potassium: 4.4 mEq/L (ref 3.5–5.1)
Sodium: 141 mEq/L (ref 135–145)
Total Protein: 7 g/dL (ref 6.0–8.3)

## 2013-11-03 LAB — IRON AND TIBC
%SAT: 32 % (ref 20–55)
IRON: 113 ug/dL (ref 42–145)
TIBC: 350 ug/dL (ref 250–470)
UIBC: 237 ug/dL (ref 125–400)

## 2013-11-03 LAB — CBC WITH DIFFERENTIAL/PLATELET
BASOS PCT: 0.8 % (ref 0.0–3.0)
Basophils Absolute: 0 10*3/uL (ref 0.0–0.1)
EOS PCT: 0.7 % (ref 0.0–5.0)
Eosinophils Absolute: 0 10*3/uL (ref 0.0–0.7)
HEMATOCRIT: 42.3 % (ref 36.0–46.0)
Hemoglobin: 14.3 g/dL (ref 12.0–15.0)
Lymphocytes Relative: 31.5 % (ref 12.0–46.0)
Lymphs Abs: 1.2 10*3/uL (ref 0.7–4.0)
MCHC: 33.8 g/dL (ref 30.0–36.0)
MCV: 92.1 fl (ref 78.0–100.0)
MONO ABS: 0.4 10*3/uL (ref 0.1–1.0)
MONOS PCT: 9.4 % (ref 3.0–12.0)
NEUTROS PCT: 57.6 % (ref 43.0–77.0)
Neutro Abs: 2.2 10*3/uL (ref 1.4–7.7)
Platelets: 234 10*3/uL (ref 150.0–400.0)
RBC: 4.59 Mil/uL (ref 3.87–5.11)
RDW: 12.5 % (ref 11.5–15.5)
WBC: 3.8 10*3/uL — AB (ref 4.0–10.5)

## 2013-11-03 NOTE — Progress Notes (Signed)
Patient ID: Samantha Raymond, female   DOB: 07-02-1960, 53 y.o.   MRN: [id number]    Subjective:     Samantha Raymond is a 53 y.o. female and is here for a comprehensive physical exam. The patient reports no problems. She has been taking a reduced dose of levothyroxine since July ,  Only 5 days per week due to suppressed TSH   Discussed weight loss goals and current diet.  Doing herbal life., , using KIND bars   Sister had a second primary  BRCA.  Her sister's genetic testing for BRCA  was negative.     History   Social History  . Marital Status: Married    Spouse Name: N/A    Number of Children: N/A  . Years of Education: N/A   Occupational History  . Not on file.   Social History Main Topics  . Smoking status: Never Smoker   . Smokeless tobacco: Never Used  . Alcohol Use: Yes  . Drug Use: No  . Sexual Activity: Not on file   Other Topics Concern  . Not on file   Social History Narrative  . No narrative on file   Health Maintenance  Topic Date Due  . Influenza Vaccine  09/16/2013  . Mammogram  09/09/2014  . Pap Smear  11/03/2016  . Colonoscopy  03/07/2020  . Tetanus/tdap  05/08/2020    The following portions of the patient's history were reviewed and updated as appropriate: allergies, current medications, past family history, past medical history, past social history, past surgical history and problem list.  Review of Systems A comprehensive review of systems was negative.   Objective:   BP 122/76  Pulse 64  Temp(Src) 98.8 F (37.1 C) (Oral)  Resp 16  Ht 5' 4"  (1.626 m)  Wt 197 lb 8 oz (89.585 kg)  BMI 33.88 kg/m2  SpO2 95%  General Appearance:    Alert, cooperative, no distress, appears stated age  Head:    Normocephalic, without obvious abnormality, atraumatic  Eyes:    PERRL, conjunctiva/corneas clear, EOM's intact, fundi    benign, both eyes  Ears:    Normal TM's and external ear canals, both ears  Nose:   Nares normal, septum midline, mucosa  normal, no drainage    or sinus tenderness  Throat:   Lips, mucosa, and tongue normal; teeth and gums normal  Neck:   Supple, symmetrical, trachea midline, no adenopathy;    thyroid:  no enlargement/tenderness/nodules; no carotid   bruit or JVD  Back:     Symmetric, no curvature, ROM normal, no CVA tenderness  Lungs:     Clear to auscultation bilaterally, respirations unlabored  Chest Wall:    No tenderness or deformity   Heart:    Regular rate and rhythm, S1 and S2 normal, no murmur, rub   or gallop  Breast Exam:    No tenderness, masses, or nipple abnormality  Abdomen:     Soft, non-tender, bowel sounds active all four quadrants,    no masses, no organomegaly  Genitalia:    Pelvic: cervix normal in appearance, external genitalia normal, no adnexal masses or tenderness, no cervical motion tenderness, rectovaginal septum normal, uterus normal size, shape, and consistency and vagina normal without discharge  Extremities:   Extremities normal, atraumatic, no cyanosis or edema  Pulses:   2+ and symmetric all extremities  Skin:   Skin color, texture, turgor normal, no rashes or lesions  Lymph nodes:   Cervical, supraclavicular, and axillary  nodes normal  Neurologic:   CNII-XII intact, normal strength, sensation and reflexes    throughout     .    Assessment and Plan:   Visit for preventive health examination Annual comprehensive exam was done including breast,  pelvic and PAP smear. All screenings have been addressed and a printed health maintenance schedule was given to patient.    Obesity (BMI 30-39.9) I have congratulated her in reduction of   BMI and encouraged  Continued weight loss with goal of 10% of body weight over the next 6 months using a low glycemic index diet and regular exercise a minimum of 5 days per week.    Hypothyroidism Thyroid function remains very active on minimal  Dose of levothyroxine (25 mcg 5 days per week).  We will suspend it and recheck. .   Lab Results   Component Value Date   TSH 0.619 11/03/2013     Elevated liver enzymes Persistent, with screening in process for autoimmune , viral and iron overload.  Fatty liver suspected.     Updated Medication List Outpatient Encounter Prescriptions as of 11/03/2013  Medication Sig  . albuterol (PROVENTIL HFA;VENTOLIN HFA) 108 (90 BASE) MCG/ACT inhaler Inhale 2 puffs into the lungs every 6 (six) hours as needed for wheezing or shortness of breath.  . Calcium-Vitamin D-Vitamin K (VIACTIV PO) Take by mouth.  . Cholecalciferol (VITAMIN D3) 1000 UNITS CAPS Take 1 capsule by mouth daily.  Marland Kitchen escitalopram (LEXAPRO) 10 MG tablet Take 1 tablet (10 mg total) by mouth daily.  . Fluticasone-Salmeterol (ADVAIR DISKUS) 100-50 MCG/DOSE AEPB Inhale 1 puff into the lungs 2 (two) times daily.  Marland Kitchen levothyroxine (SYNTHROID, LEVOTHROID) 25 MCG tablet Take 1 tablet (25 mcg total) by mouth daily. 1 tablet Monday - Friday  . Multiple Vitamin (MULTIVITAMIN) tablet Take 1 tablet by mouth daily.  . [DISCONTINUED] escitalopram (LEXAPRO) 10 MG tablet Take 1 tablet (10 mg total) by mouth daily.

## 2013-11-03 NOTE — Progress Notes (Signed)
Pre-visit discussion using our clinic review tool. No additional management support is needed unless otherwise documented below in the visit note.  

## 2013-11-03 NOTE — Patient Instructions (Signed)
You had your annual  wellness exam today.  We will repeat your PAP smear in 2018, sooner if needed   We will schedule your mammogram at [facility],   I'm repeating your thyroid and liver studies today  We will contact you with the bloodwork results  Please let me know which Dermatologist Dr. Tamala Julian recommends for the skin lesion on your right tibia

## 2013-11-04 LAB — HEPATITIS C ANTIBODY: HCV Ab: NEGATIVE

## 2013-11-04 LAB — T4 AND TSH
T4 TOTAL: 9.6 ug/dL (ref 4.5–12.0)
TSH: 0.619 u[IU]/mL (ref 0.450–4.500)

## 2013-11-05 ENCOUNTER — Encounter: Payer: Self-pay | Admitting: Internal Medicine

## 2013-11-05 DIAGNOSIS — K760 Fatty (change of) liver, not elsewhere classified: Secondary | ICD-10-CM | POA: Insufficient documentation

## 2013-11-05 NOTE — Assessment & Plan Note (Addendum)
Thyroid function remains very active on minimal  Dose of levothyroxine (25 mcg 5 days per week).  We will suspend it and recheck. .   Lab Results  Component Value Date   TSH 0.619 11/03/2013

## 2013-11-05 NOTE — Assessment & Plan Note (Signed)
Persistent, with screening in process for autoimmune , viral and iron overload.  Fatty liver suspected.

## 2013-11-05 NOTE — Assessment & Plan Note (Signed)
Annual comprehensive exam was done including breast, pelvic and PAP smear. All screenings have been addressed and a printed health maintenance schedule was given to patient.   

## 2013-11-05 NOTE — Assessment & Plan Note (Signed)
I have congratulated her in reduction of   BMI and encouraged  Continued weight loss with goal of 10% of body weight over the next 6 months using a low glycemic index diet and regular exercise a minimum of 5 days per week.     

## 2013-11-06 LAB — CYTOLOGY - PAP

## 2013-11-06 LAB — ANA: Anti Nuclear Antibody(ANA): NEGATIVE

## 2013-11-06 LAB — ANTI-SMITH ANTIBODY: ENA SM Ab Ser-aCnc: <1

## 2013-11-07 LAB — HM PAP SMEAR: HM Pap smear: NORMAL

## 2013-11-08 ENCOUNTER — Encounter: Payer: Self-pay | Admitting: Internal Medicine

## 2013-11-08 MED ORDER — PHENTERMINE HCL 37.5 MG PO TABS: ORAL_TABLET | ORAL | Status: DC

## 2013-11-08 NOTE — Telephone Encounter (Signed)
Please place phentermine rx ifor patient pickup once signed

## 2013-11-13 ENCOUNTER — Other Ambulatory Visit: Payer: Self-pay | Admitting: Internal Medicine

## 2013-11-13 ENCOUNTER — Ambulatory Visit
Admission: RE | Admit: 2013-11-13 | Discharge: 2013-11-13 | Disposition: A | Discharge status: Home / Self Care | Payer: 59 | Source: Ambulatory Visit | Attending: Internal Medicine | Admitting: Internal Medicine

## 2013-11-13 DIAGNOSIS — Z1239 Encounter for other screening for malignant neoplasm of breast: Secondary | ICD-10-CM

## 2013-11-13 DIAGNOSIS — Z1231 Encounter for screening mammogram for malignant neoplasm of breast: Secondary | ICD-10-CM

## 2013-11-15 ENCOUNTER — Encounter: Payer: Self-pay | Admitting: Internal Medicine

## 2014-01-09 ENCOUNTER — Ambulatory Visit (INDEPENDENT_AMBULATORY_CARE_PROVIDER_SITE_OTHER): Payer: 59

## 2014-01-09 ENCOUNTER — Ambulatory Visit (INDEPENDENT_AMBULATORY_CARE_PROVIDER_SITE_OTHER): Payer: 59 | Admitting: Family Medicine

## 2014-01-09 VITALS — BP 122/76 | HR 60 | Temp 98.9°F | Resp 17 | Ht 64.0 in

## 2014-01-09 DIAGNOSIS — R05 Cough: Secondary | ICD-10-CM

## 2014-01-09 DIAGNOSIS — R059 Cough, unspecified: Secondary | ICD-10-CM

## 2014-01-09 DIAGNOSIS — G4452 New daily persistent headache (NDPH): Secondary | ICD-10-CM

## 2014-01-09 DIAGNOSIS — R6883 Chills (without fever): Secondary | ICD-10-CM

## 2014-01-09 DIAGNOSIS — R112 Nausea with vomiting, unspecified: Secondary | ICD-10-CM

## 2014-01-09 DIAGNOSIS — R5381 Other malaise: Secondary | ICD-10-CM

## 2014-01-09 LAB — COMPREHENSIVE METABOLIC PANEL
ALBUMIN: 4.5 g/dL (ref 3.5–5.2)
ALK PHOS: 90 U/L (ref 39–117)
ALT: 49 U/L — ABNORMAL HIGH (ref 0–35)
AST: 30 U/L (ref 0–37)
BILIRUBIN TOTAL: 0.9 mg/dL (ref 0.2–1.2)
BUN: 11 mg/dL (ref 6–23)
CO2: 26 mEq/L (ref 19–32)
CREATININE: 0.82 mg/dL (ref 0.50–1.10)
Calcium: 9.2 mg/dL (ref 8.4–10.5)
Chloride: 103 mEq/L (ref 96–112)
GLUCOSE: 103 mg/dL — AB (ref 70–99)
POTASSIUM: 3.8 meq/L (ref 3.5–5.3)
Sodium: 139 mEq/L (ref 135–145)
Total Protein: 6.8 g/dL (ref 6.0–8.3)

## 2014-01-09 LAB — POCT CBC
Granulocyte percent: 76.4 %G (ref 37–80)
HCT, POC: 43.5 % (ref 37.7–47.9)
HEMOGLOBIN: 14.5 g/dL (ref 12.2–16.2)
LYMPH, POC: 1.6 (ref 0.6–3.4)
MCH: 30.2 pg (ref 27–31.2)
MCHC: 33.2 g/dL (ref 31.8–35.4)
MCV: 91 fL (ref 80–97)
MID (CBC): 0.2 (ref 0–0.9)
MPV: 7.5 fL (ref 0–99.8)
PLATELET COUNT, POC: 224 10*3/uL (ref 142–424)
POC GRANULOCYTE: 5.9 (ref 2–6.9)
POC LYMPH PERCENT: 20.8 %L (ref 10–50)
POC MID %: 2.8 % (ref 0–12)
RBC: 4.78 M/uL (ref 4.04–5.48)
RDW, POC: 12.4 %
WBC: 7.7 10*3/uL (ref 4.6–10.2)

## 2014-01-09 LAB — POCT INFLUENZA A/B
INFLUENZA A, POC: NEGATIVE
Influenza B, POC: NEGATIVE

## 2014-01-09 MED ORDER — ACETAMINOPHEN 325 MG PO TABS
1000.0000 mg | ORAL_TABLET | Freq: Once | ORAL | Status: AC
  Administered 2014-01-09: 975 mg via ORAL

## 2014-01-09 NOTE — Progress Notes (Addendum)
[facility] [address] [place] [address] [phone]  Date:  01/09/2014   Name:  Samantha Raymond   DOB:  29-Apr-1960   MRN:  [id number]  PCP:  Deborra Medina, MD    Chief Complaint: Cough; URI; Laryngitis; Emesis; and Headache   History of Present Illness:  Samantha Raymond is a 53 y.o. very pleasant female patient who presents with the following:  Today is Tuesday.  On Sunday evening she noted epigastric pain.  She vomited "all night. I thought I must have eaten something bad."  Vomiting has stopped now but she still feels like she might need to have diarrhea. She has not eaten anything much over the last couple of days, just a few crackers.  She is trying to drink liquids but this is also unappealing  She has slept a lot and has felt weak She notes aches and chills.   No antipyretics today.  She has not eaten anything today.   She has noted a headache over the last couple of days.  She has also started to cough some She no longer has the epigastric pain, but she does have some lower abdominal cramping feeling No sick contacts at home.   She does still have her gallbladder.   S/p menopause in her late 46s  She is generally in good health.    Patient Active Problem List   Diagnosis Date Noted  . Elevated liver enzymes 11/05/2013  . Unspecified vitamin D deficiency 08/24/2012  . Visit for preventive health examination 08/20/2012  . Menopause 08/17/2012  . Obesity (BMI 30-39.9) 08/09/2011  . Hypothyroidism 08/09/2011  . Recurrent urinary tract infection   . Neutropenia 08/07/2011    Past Medical History  Diagnosis Date  . hypothyroid   . Recurrent urinary tract infection     No past surgical history on file.  History  Substance Use Topics  . Smoking status: Never Smoker   . Smokeless tobacco: Never Used  . Alcohol Use: Yes    Family History  Problem Relation Age of Onset  . Asthma Mother   . Diabetes Father   . Heart disease  Father     chf,  no history of AMI  . Hyperlipidemia Father   . Arthritis Sister   . Cancer Sister 37    breast cancer , second diagnosis at 32  . Asthma Brother   . Stroke Maternal Aunt   . Cancer Maternal Aunt     breast ca    Allergies  Allergen Reactions  . Sulfa Antibiotics Nausea And Vomiting    Medication list has been reviewed and updated.  Current Outpatient Prescriptions on File Prior to Visit  Medication Sig Dispense Refill  . albuterol (PROVENTIL HFA;VENTOLIN HFA) 108 (90 BASE) MCG/ACT inhaler Inhale 2 puffs into the lungs every 6 (six) hours as needed for wheezing or shortness of breath. 1 Inhaler 11  . Calcium-Vitamin D-Vitamin K (VIACTIV PO) Take by mouth.    . Cholecalciferol (VITAMIN D3) 1000 UNITS CAPS Take 1 capsule by mouth daily.    Marland Kitchen escitalopram (LEXAPRO) 10 MG tablet Take 1 tablet (10 mg total) by mouth daily. 30 tablet 1  . Fluticasone-Salmeterol (ADVAIR DISKUS) 100-50 MCG/DOSE AEPB Inhale 1 puff into the lungs 2 (two) times daily. 180 each 0  . levothyroxine (SYNTHROID, LEVOTHROID) 25 MCG tablet Take 1 tablet (25 mcg total) by mouth daily. 1 tablet Monday - Friday 90 tablet 0  . Multiple Vitamin (MULTIVITAMIN) tablet Take  1 tablet by mouth daily.    . phentermine (ADIPEX-P) 37.5 MG tablet 1/2 tablet in the am and early afternoon (Patient not taking: Reported on 01/09/2014) 30 tablet 2   No current facility-administered medications on file prior to visit.    Review of Systems:  As per HPI- otherwise negative.   Physical Examination: Filed Vitals:   01/09/14 0909  BP: 122/76  Pulse: 120  Temp: 98.9 F (37.2 C)  Resp: 17   Filed Vitals:   01/09/14 0909  Height: 5\' 4"  (1.626 m)   There is no weight on file to calculate BMI. Ideal Body Weight: Weight in (lb) to have BMI = 25: 145.3  GEN: WDWN, NAD, Non-toxic, A & O x 3, overweight. Looks well HEENT: Atraumatic, Normocephalic. Neck supple. No masses, No LAD.  Bilateral TM wnl, oropharynx  normal.  PEERL,EOMI.   Ears and Nose: No external deformity. CV: RRR, No M/G/R. No JVD. No thrill. No extra heart sounds. PULM: CTA B, no wheezes, crackles, rhonchi. No retractions. No resp. distress. No accessory muscle use. ABD: S, ND, +BS. No rebound. No HSM.  Mild tenderness over abdomen. More at epigastrum.  Negative murphy's sign.  No concerning RLQ tenderness  EXTR: No c/c/e NEURO Normal gait.  PSYCH: Normally interactive. Conversant. Not depressed or anxious appearing.  Calm demeanor.   Results for orders placed or performed in visit on 01/09/14  POCT CBC  Result Value Ref Range   WBC 7.7 4.6 - 10.2 K/uL   Lymph, poc 1.6 0.6 - 3.4   POC LYMPH PERCENT 20.8 10 - 50 %L   MID (cbc) 0.2 0 - 0.9   POC MID % 2.8 0 - 12 %M   POC Granulocyte 5.9 2 - 6.9   Granulocyte percent 76.4 37 - 80 %G   RBC 4.78 4.04 - 5.48 M/uL   Hemoglobin 14.5 12.2 - 16.2 g/dL   HCT, POC 43.5 37.7 - 47.9 %   MCV 91.0 80 - 97 fL   MCH, POC 30.2 27 - 31.2 pg   MCHC 33.2 31.8 - 35.4 g/dL   RDW, POC 12.4 %   Platelet Count, POC 224 142 - 424 K/uL   MPV 7.5 0 - 99.8 fL  POCT Influenza A/B  Result Value Ref Range   Influenza A, POC Negative    Influenza B, POC Negative    Given IV fluids- I liter NS  Also 1gm of tylenol  [facility] reading (PRIMARY) by  Dr. Lorelei Pont. CXR:   Negative  CHEST 2 VIEW  COMPARISON: None.  FINDINGS: The lungs are well-expanded and clear. There is no pleural effusion or pneumothorax. The heart, pulmonary vascularity, and mediastinal structures are unremarkable. The bony thorax exhibits no acute abnormality.  IMPRESSION: There is no active cardiopulmonary disease.   Assessment and Plan: Non-intractable vomiting with nausea, vomiting of unspecified type - Plan: POCT CBC, Comprehensive metabolic panel  Chills - Plan: POCT Influenza A/B, DG Chest 2 View  Cough - Plan: POCT Influenza A/B, DG Chest 2 View  Malaise - Plan: POCT Influenza A/B  New daily persistent headache  - Plan: acetaminophen (TYLENOL) tablet 975 mg  Here today with likely viral syndrome.  Given 1L of saline, tylenol and felt somewhat better.   She will go home and rest, push fluids and will let me know if not better in the next day or so.    Signed Lamar Blinks, MD  Called to check on her 11/25.  She is feeling better and  will let me know if she needs anything.  She has a history of mild elevation of her LFTs

## 2014-01-09 NOTE — Patient Instructions (Signed)
Rest and drink plenty of fluids.  Take tylenol and ibuprofen as needed for your symptoms.   I will be in touch with the rest of your labs,  Let me know if you do not feel better Call me if you need anything [phone]

## 2014-01-10 ENCOUNTER — Encounter: Payer: Self-pay | Admitting: Family Medicine

## 2014-01-19 ENCOUNTER — Ambulatory Visit (INDEPENDENT_AMBULATORY_CARE_PROVIDER_SITE_OTHER): Payer: 59 | Admitting: Emergency Medicine

## 2014-01-19 VITALS — BP 122/76 | HR 100 | Temp 98.5°F | Resp 16

## 2014-01-19 DIAGNOSIS — J0140 Acute pansinusitis, unspecified: Secondary | ICD-10-CM

## 2014-01-19 DIAGNOSIS — J209 Acute bronchitis, unspecified: Secondary | ICD-10-CM

## 2014-01-19 MED ORDER — AMOXICILLIN-POT CLAVULANATE 875-125 MG PO TABS: 1.0000 | ORAL_TABLET | Freq: Two times a day (BID) | ORAL | Status: DC

## 2014-01-19 MED ORDER — PSEUDOEPHEDRINE-GUAIFENESIN ER 60-600 MG PO TB12: 1.0000 | ORAL_TABLET | Freq: Two times a day (BID) | ORAL | Status: DC

## 2014-01-19 NOTE — Patient Instructions (Signed)

## 2014-01-19 NOTE — Progress Notes (Signed)
[facility] [address] [place] [address] [phone]  Date:  01/19/2014   Name:  Samantha Raymond   DOB:  11/28/1960   MRN:  [id number]  PCP:  Deborra Medina, MD    Chief Complaint: Sore Throat   History of Present Illness:  Samantha Raymond is a 53 y.o. very pleasant female patient who presents with the following:  Ill since last week with initially nausea and vomiting.  No stool change.  That has resolved. This week she has had post nasal drainage and a productive cough.  She is expectorating green sputum She has some wheezing, with a history of reactive airway disease. No fever or chills. No nausea or vomiting.  No stool change Has very sore throat No improvement with over the counter medications or other home remedies.  Denies other complaint or health concern today.   Patient Active Problem List   Diagnosis Date Noted  . Elevated liver enzymes 11/05/2013  . Unspecified vitamin D deficiency 08/24/2012  . Visit for preventive health examination 08/20/2012  . Menopause 08/17/2012  . Obesity (BMI 30-39.9) 08/09/2011  . Hypothyroidism 08/09/2011  . Recurrent urinary tract infection   . Neutropenia 08/07/2011    Past Medical History  Diagnosis Date  . hypothyroid   . Recurrent urinary tract infection     History reviewed. No pertinent past surgical history.  History  Substance Use Topics  . Smoking status: Never Smoker   . Smokeless tobacco: Never Used  . Alcohol Use: Yes    Family History  Problem Relation Age of Onset  . Asthma Mother   . Diabetes Father   . Heart disease Father     chf,  no history of AMI  . Hyperlipidemia Father   . Arthritis Sister   . Cancer Sister 11    breast cancer , second diagnosis at 48  . Asthma Brother   . Stroke Maternal Aunt   . Cancer Maternal Aunt     breast ca    Allergies  Allergen Reactions  . Sulfa Antibiotics Nausea And Vomiting    Medication list has been reviewed and  updated.  Current Outpatient Prescriptions on File Prior to Visit  Medication Sig Dispense Refill  . albuterol (PROVENTIL HFA;VENTOLIN HFA) 108 (90 BASE) MCG/ACT inhaler Inhale 2 puffs into the lungs every 6 (six) hours as needed for wheezing or shortness of breath. 1 Inhaler 11  . Calcium-Vitamin D-Vitamin K (VIACTIV PO) Take by mouth.    . Cholecalciferol (VITAMIN D3) 1000 UNITS CAPS Take 1 capsule by mouth daily.    Marland Kitchen escitalopram (LEXAPRO) 10 MG tablet Take 1 tablet (10 mg total) by mouth daily. 30 tablet 1  . Fluticasone-Salmeterol (ADVAIR DISKUS) 100-50 MCG/DOSE AEPB Inhale 1 puff into the lungs 2 (two) times daily. 180 each 0  . levothyroxine (SYNTHROID, LEVOTHROID) 25 MCG tablet Take 1 tablet (25 mcg total) by mouth daily. 1 tablet Monday - Friday 90 tablet 0  . Multiple Vitamin (MULTIVITAMIN) tablet Take 1 tablet by mouth daily.    . phentermine (ADIPEX-P) 37.5 MG tablet 1/2 tablet in the am and early afternoon (Patient not taking: Reported on 01/09/2014) 30 tablet 2   No current facility-administered medications on file prior to visit.    Review of Systems:  As per HPI, otherwise negative.    Physical Examination: Filed Vitals:   01/19/14 1415  BP: 122/76  Pulse: 100  Temp: 98.5 F (36.9 C)  Resp: 16   There  were no vitals filed for this visit. There is no weight on file to calculate BMI. Ideal Body Weight:    GEN: WDWN, NAD, Non-toxic, A & O x 3 HEENT: Atraumatic, Normocephalic. Neck supple. No masses, No LAD. Ears and Nose: No external deformity. CV: RRR, No M/G/R. No JVD. No thrill. No extra heart sounds. PULM: CTA B, no wheezes, crackles, rhonchi. No retractions. No resp. distress. No accessory muscle use. ABD: S, NT, ND, +BS. No rebound. No HSM. EXTR: No c/c/e NEURO Normal gait.  PSYCH: Normally interactive. Conversant. Not depressed or anxious appearing.  Calm demeanor.    Assessment and Plan: Sinusitis Bronchitis augmentin mucinex d  Signed,   Ellison Carwin, MD

## 2014-01-23 ENCOUNTER — Encounter: Payer: Self-pay | Admitting: Internal Medicine

## 2014-02-02 ENCOUNTER — Ambulatory Visit: Payer: Self-pay | Admitting: Internal Medicine

## 2014-04-18 ENCOUNTER — Other Ambulatory Visit: Payer: Self-pay | Admitting: Internal Medicine

## 2014-06-07 ENCOUNTER — Other Ambulatory Visit: Payer: Self-pay | Admitting: Internal Medicine

## 2014-06-08 ENCOUNTER — Ambulatory Visit (INDEPENDENT_AMBULATORY_CARE_PROVIDER_SITE_OTHER): Payer: 59 | Admitting: Physician Assistant

## 2014-06-08 VITALS — BP 120/64 | HR 66 | Temp 97.9°F | Resp 20 | Ht 65.0 in | Wt 202.0 lb

## 2014-06-08 DIAGNOSIS — J4520 Mild intermittent asthma, uncomplicated: Secondary | ICD-10-CM

## 2014-06-08 DIAGNOSIS — R059 Cough, unspecified: Secondary | ICD-10-CM

## 2014-06-08 DIAGNOSIS — R05 Cough: Secondary | ICD-10-CM | POA: Diagnosis not present

## 2014-06-08 MED ORDER — ALBUTEROL SULFATE (2.5 MG/3ML) 0.083% IN NEBU: 2.5000 mg | INHALATION_SOLUTION | Freq: Once | RESPIRATORY_TRACT | Status: DC

## 2014-06-08 NOTE — Progress Notes (Signed)
   Subjective:    Raymond ID: Samantha Raymond, female    DOB: November 28, 1960, 54 y.o.   MRN: [id number]  HPI Pt presents to clinic with trouble breathing.  She has RAD that is typically worse in the fall with cold weather but today she is having a tickle/catch in her upper chest and a cough and she feels like she cannot get a full deep breath.  She is a little anxious about it because she is worried that it will get worse.  She used her Advair this am but her albuterol is not with her (she knows she has 1 at home).  She has not been having a cold or allergy symptoms recently and she is unsure what triggered this problem.  Review of Systems  Respiratory: Positive for cough and shortness of breath. Negative for wheezing.        Objective:   Physical Exam  Constitutional: She is oriented to person, place, and time. She appears well-developed and well-nourished.  BP 120/64 mmHg  Pulse 66  Temp(Src) 97.9 F (36.6 C) (Oral)  Resp 20  Ht 5\' 5"  (1.651 m)  Wt 202 lb (91.627 kg)  BMI 33.61 kg/m2  SpO2 96%   HENT:  Head: Normocephalic and atraumatic.  Right Ear: Hearing, tympanic membrane, external ear and ear canal normal.  Left Ear: Hearing, tympanic membrane, external ear and ear canal normal.  Nose: Nose normal.  Cardiovascular: Normal rate, regular rhythm and normal heart sounds.   No murmur heard. Pulmonary/Chest: Breath sounds normal. She has no wheezes.  Pt is able to take a deep breath but she works to do it without coughing.  There is no wheezing even with forced expiration.  Neurological: She is alert and oriented to person, place, and time.  Skin: Skin is warm and dry.  Psychiatric: She has a normal mood and affect. Her behavior is normal. Judgment and thought content normal.   Pt given an albuterol neb and she is much better.  The sensation of inability to catch her breath is gone.  She is coughing more sometime productive but she feels much better.   Her lung exam has improved  as she is able to take a deep breath without pausing to prevent a cough.    Assessment & Plan:  Cough - Plan: albuterol (PROVENTIL) (2.5 MG/3ML) 0.083% nebulizer solution 2.5 mg  RAD (reactive airway disease), mild intermittent, uncomplicated - Plan: albuterol (PROVENTIL) (2.5 MG/3ML) 0.083% nebulizer solution 2.5 mg  Pt will contact me if she needs more albuterol but she knows that she a new one unopened at home and one in her golf bag so she feels confident that she does not need one.  She will continue her Advair and recheck if she has any problems.  Windell Hummingbird PA-C  [facility] 06/08/2014 3:55 PM

## 2014-06-11 ENCOUNTER — Ambulatory Visit (INDEPENDENT_AMBULATORY_CARE_PROVIDER_SITE_OTHER): Payer: 59 | Admitting: Family Medicine

## 2014-06-11 VITALS — BP 114/70 | HR 65 | Temp 98.6°F

## 2014-06-11 DIAGNOSIS — J683 Other acute and subacute respiratory conditions due to chemicals, gases, fumes and vapors: Secondary | ICD-10-CM

## 2014-06-11 DIAGNOSIS — R05 Cough: Secondary | ICD-10-CM

## 2014-06-11 DIAGNOSIS — R059 Cough, unspecified: Secondary | ICD-10-CM

## 2014-06-11 DIAGNOSIS — J4540 Moderate persistent asthma, uncomplicated: Secondary | ICD-10-CM | POA: Diagnosis not present

## 2014-06-11 MED ORDER — PREDNISONE 20 MG PO TABS: ORAL_TABLET | ORAL | Status: DC

## 2014-06-11 NOTE — Progress Notes (Signed)
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 3 days ago she had some increasing pulmonary tightness and was given a breathing treatment which helped quite a bit. Then, on Saturday, 2 days ago, patient developed area productive cough which has gotten violent. She coughs to the point of almost passing out. .the cough is very productive.  Patient is taking Advair forward has been called reactive airways disease. Is a recurring problem that usually happens in this time year.  Patient has been having some dyspnea on exertion but no fever.  Objective:BP 114/70 mmHg  Pulse 65  Temp(Src) 98.6 F (37 C) (Oral)  SpO2 98% HEENT: Unremarkable with exception of severe nasal passages Neck: Supple Chest: Bilateral expiratory wheezes  Assessment: Asthma or reactive airways disease, acute worsening  Plan: Prednisone for 2-5 days depending on response tonight. We need to get systemic medicine and so the Advair can reach the smaller airways.  I discussed with patient the fact that we want to use the least amount of prednisone necessary to open her lungs but that she needs systemic treatment so that her bronchodilators can have a chance of working.  If this doesn't work, patient will need to be seen again, given breathing treatment and possibly Solu-Medrol.  Signed, Carola Frost.D.

## 2014-06-11 NOTE — Patient Instructions (Signed)

## 2014-06-28 ENCOUNTER — Ambulatory Visit (INDEPENDENT_AMBULATORY_CARE_PROVIDER_SITE_OTHER): Payer: 59 | Admitting: Family Medicine

## 2014-06-28 VITALS — BP 122/68 | HR 62 | Temp 98.2°F | Resp 17

## 2014-06-28 DIAGNOSIS — B078 Other viral warts: Secondary | ICD-10-CM

## 2014-06-28 NOTE — Progress Notes (Signed)
   Subjective:    Patient ID: Samantha Raymond, female    DOB: January 05, 1961, 54 y.o.   MRN: [id number]  HPI This is a very pleasant 54 yo female who presents today with a recurrent common wart on her right great toe. She had this treated several years ago with some type of liquid that caused it to blister. Was not liquid nitrogen. Resolved for awhile and has gradually regrown. No other lesions. Not painful. She has tried to keep it filed down, but it continues to get larger.   Medications, allergies, past medical history, surgical history, family history, social history and problem list reviewed and updated.   Review of Systems     Objective:   Physical Exam  Constitutional: She is oriented to person, place, and time. She appears well-developed and well-nourished.  HENT:  Head: Normocephalic and atraumatic.  Eyes: Conjunctivae are normal.  Neck: Normal range of motion. Neck supple.  Cardiovascular: Normal rate.   Pulmonary/Chest: Effort normal.  Musculoskeletal: Normal range of motion.  Neurological: She is alert and oriented to person, place, and time.  Skin: Skin is warm and dry.  Right great toe with common wart on medial aspect near nail.   Psychiatric: She has a normal mood and affect. Her behavior is normal. Judgment and thought content normal.  Vitals reviewed.   BP 122/68 mmHg  Pulse 62  Temp(Src) 98.2 F (36.8 C) (Oral)  Resp 17  SpO2 98% Verbal consent obtained.  Cleansed skin with alcohol prep pad.  Cryotherapy applied (freeze-thaw-freeze-thaw-freeze).  The patient tolerated the procedure well.    Assessment & Plan:  1. Common wart - cryotherapy tolerated well - patient instructions provided- keep area clean and dry, RTC if any fever, pain , purulent drainage. - retreat in 10-14 days as needed   Clarene Reamer, FNP-BC  [facility]  06/30/2014 11:01 AM

## 2014-07-31 DIAGNOSIS — Z7689 Persons encountering health services in other specified circumstances: Secondary | ICD-10-CM

## 2014-08-01 ENCOUNTER — Telehealth: Payer: Self-pay | Admitting: Internal Medicine

## 2014-08-01 NOTE — Telephone Encounter (Signed)
[facility] form completed as requested;  Charge $50  , make copy of form before sending to [facility].  Needs med list to accompany it

## 2014-08-02 NOTE — Telephone Encounter (Signed)
Copy of paperwork made for billing and scan emailed patient ready for pickup and placed original at front desk.

## 2014-08-10 ENCOUNTER — Ambulatory Visit (INDEPENDENT_AMBULATORY_CARE_PROVIDER_SITE_OTHER): Payer: 59 | Admitting: Physician Assistant

## 2014-08-10 VITALS — BP 138/72 | HR 68 | Temp 98.3°F | Resp 20

## 2014-08-10 DIAGNOSIS — S61102A Unspecified open wound of left thumb with damage to nail, initial encounter: Secondary | ICD-10-CM

## 2014-08-10 DIAGNOSIS — S61112A Laceration without foreign body of left thumb with damage to nail, initial encounter: Secondary | ICD-10-CM | POA: Diagnosis not present

## 2014-08-10 DIAGNOSIS — M79645 Pain in left finger(s): Secondary | ICD-10-CM

## 2014-08-24 ENCOUNTER — Encounter: Payer: Self-pay | Admitting: Physician Assistant

## 2014-08-24 NOTE — Patient Instructions (Signed)

## 2014-08-24 NOTE — Progress Notes (Signed)
Patient ID: Samantha Raymond, female    DOB: Jun 29, 1960, 54 y.o.   MRN: [id number]  PCP: Crecencio Mc, MD  Subjective:   Chief Complaint  Patient presents with  . Laceration    HPI Presents for evaluation of laceration of LEFT thumb. She is RIGHT hand dominant and is current on tetanus vaccination.  30 minutes ago was cutting produce for a salad when she accidentally cut the LEFT thumb with the knife. She washed it and bandaged it and presented for care.    Review of Systems  Constitutional: Negative for fever, chills, diaphoresis and fatigue.  Skin: Positive for wound.  Neurological: Negative for dizziness, tremors, weakness, light-headedness and numbness.       Patient Active Problem List   Diagnosis Date Noted  . Elevated liver enzymes 11/05/2013  . 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 Unspecified vitamin D deficiency 08/24/2012  . Visit for preventive health examination 08/20/2012  . Menopause 08/17/2012  . Obesity (BMI 30-39.9) 08/09/2011  . Hypothyroidism 08/09/2011  . Recurrent urinary tract infection   . Neutropenia 08/07/2011     Prior to Admission medications   Medication Sig Start Date End Date Taking? Authorizing Provider  albuterol (PROVENTIL HFA;VENTOLIN HFA) 108 (90 BASE) MCG/ACT inhaler Inhale 2 puffs into the lungs every 6 (six) hours as needed for wheezing or shortness of breath. 09/06/13   Crecencio Mc, MD  Calcium-Vitamin D-Vitamin K (VIACTIV PO) Take by mouth.    Historical Provider, MD  Cholecalciferol (VITAMIN D3) 1000 UNITS CAPS Take 1 capsule by mouth daily.    Historical Provider, MD  escitalopram (LEXAPRO) 10 MG tablet TAKE 1 TABLET BY MOUTH ONCE DAILY 04/18/14   Crecencio Mc, MD  Fluticasone-Salmeterol (ADVAIR DISKUS) 100-50 MCG/DOSE AEPB Inhale 1 puff into the lungs 2 (two) times daily. 09/06/13   Crecencio Mc, MD  levothyroxine (SYNTHROID, LEVOTHROID) 25 MCG tablet TAKE 1 TABLET BY MOUTH ONCE DAILY MONDAY THROUGH FRIDAY 06/07/14    Crecencio Mc, MD  Multiple Vitamin (MULTIVITAMIN) tablet Take 1 tablet by mouth daily.    Historical Provider, MD  triamcinolone (NASACORT) 55 MCG/ACT AERO nasal inhaler Place 2 sprays into the nose daily.    Historical Provider, MD     Allergies  Allergen Reactions  . Sulfa Antibiotics Nausea And Vomiting       Objective:  Physical Exam  Constitutional: She is oriented to person, place, and time. She appears well-developed and well-nourished. She is active and cooperative. No distress.  BP 138/72 mmHg  Pulse 68  Temp(Src) 98.3 F (36.8 C) (Oral)  Resp 20   Eyes: Conjunctivae are normal.  Pulmonary/Chest: Effort normal.  Neurological: She is alert and oriented to person, place, and time.  Skin: Skin is warm and dry. Laceration noted. Petechiae: LEFT thumb, 1 cm, cresent-shaped laceration adjacent tothe nail. The nail is also cut, but the nail bed is intact without injury. She is not diaphoretic. No pallor.  Psychiatric: She has a normal mood and affect. Her speech is normal and behavior is normal.   Verbal consent obtained from patient.  Local anesthesia with 4cc Lidocaine 2% without epinephrine.  Wound scrubbed with soap and water and rinsed.  Wound closed with #3 5-0 Ethilon simple interrupted sutures.  Wound cleansed and dressed.         Assessment & Plan:   1. Pain of left thumb 2. Open wound of thumb with damage to nail, left, initial encounter Anticipatory guidance. Local wound care. Return for suture  removal in 7-10 days.     Fara Chute, PA-C Physician Assistant-Certified [facility] .

## 2014-08-27 ENCOUNTER — Other Ambulatory Visit: Payer: Self-pay | Admitting: Family Medicine

## 2014-08-27 ENCOUNTER — Other Ambulatory Visit (INDEPENDENT_AMBULATORY_CARE_PROVIDER_SITE_OTHER): Payer: 59

## 2014-08-27 DIAGNOSIS — I776 Arteritis, unspecified: Secondary | ICD-10-CM

## 2014-08-27 LAB — CBC
HCT: 39.6 % (ref 36.0–46.0)
Hemoglobin: 13.8 g/dL (ref 12.0–15.0)
MCH: 30.6 pg (ref 26.0–34.0)
MCHC: 34.8 g/dL (ref 30.0–36.0)
MCV: 87.8 fL (ref 78.0–100.0)
MPV: 9.9 fL (ref 8.6–12.4)
Platelets: 207 10*3/uL (ref 150–400)
RBC: 4.51 MIL/uL (ref 3.87–5.11)
RDW: 13.7 % (ref 11.5–15.5)
WBC: 3.3 10*3/uL — AB (ref 4.0–10.5)

## 2014-08-27 LAB — SEDIMENTATION RATE: Sed Rate: 4 mm/hr (ref 0–30)

## 2014-08-27 LAB — POCT URINALYSIS DIPSTICK
GLUCOSE UA: NEGATIVE
Leukocytes, UA: NEGATIVE
Nitrite, UA: NEGATIVE
Protein, UA: NEGATIVE
RBC UA: NEGATIVE
Urobilinogen, UA: 0.2
pH, UA: 5.5

## 2014-08-27 LAB — POCT UA - MICROSCOPIC ONLY
CASTS, UR, LPF, POC: NEGATIVE
Crystals, Ur, HPF, POC: NEGATIVE
Epithelial cells, urine per micros: NEGATIVE
Mucus, UA: NEGATIVE
RBC, URINE, MICROSCOPIC: NEGATIVE
WBC, Ur, HPF, POC: NEGATIVE
Yeast, UA: NEGATIVE

## 2014-08-27 LAB — C-REACTIVE PROTEIN: CRP: 1.2 mg/dL — AB (ref <0.60)

## 2014-08-27 LAB — RHEUMATOID FACTOR: Rhuematoid fact SerPl-aCnc: <10 IU/mL (ref <=14)

## 2014-08-27 NOTE — Progress Notes (Signed)
She played golf in the heat yesterday- at the end of the day she noted a red rash on her bilateral lower legs. She has has this sort of reaction in the past but never this severe.  She OW feels well, but her legs do feel a little tight.  She is concerned about vasculitis

## 2014-08-27 NOTE — Progress Notes (Signed)
Pt is here for lab work only. 

## 2014-08-27 NOTE — Progress Notes (Deleted)
Pt is here for lab work only. 

## 2014-08-28 ENCOUNTER — Other Ambulatory Visit: Payer: Self-pay | Admitting: Family Medicine

## 2014-08-28 DIAGNOSIS — D709 Neutropenia, unspecified: Secondary | ICD-10-CM

## 2014-08-29 ENCOUNTER — Other Ambulatory Visit: Payer: Self-pay | Admitting: Family Medicine

## 2014-08-29 ENCOUNTER — Ambulatory Visit (INDEPENDENT_AMBULATORY_CARE_PROVIDER_SITE_OTHER): Payer: 59 | Admitting: Family Medicine

## 2014-08-29 ENCOUNTER — Encounter: Payer: Self-pay | Admitting: Family Medicine

## 2014-08-29 VITALS — BP 118/76 | HR 76 | Temp 98.0°F | Resp 16

## 2014-08-29 DIAGNOSIS — R7989 Other specified abnormal findings of blood chemistry: Secondary | ICD-10-CM | POA: Diagnosis not present

## 2014-08-29 DIAGNOSIS — R945 Abnormal results of liver function studies: Principal | ICD-10-CM

## 2014-08-29 LAB — POCT CBC
Granulocyte percent: 56.5 %G (ref 37–80)
HEMATOCRIT: 38.8 % (ref 37.7–47.9)
Hemoglobin: 13.1 g/dL (ref 12.2–16.2)
LYMPH, POC: 1.2 (ref 0.6–3.4)
MCH: 29.7 pg (ref 27–31.2)
MCHC: 33.8 g/dL (ref 31.8–35.4)
MCV: 87.9 fL (ref 80–97)
MID (cbc): 0.3 (ref 0–0.9)
MPV: 7 fL (ref 0–99.8)
PLATELET COUNT, POC: 240 10*3/uL (ref 142–424)
POC GRANULOCYTE: 2 (ref 2–6.9)
POC LYMPH %: 34.7 % (ref 10–50)
POC MID %: 8.8 % (ref 0–12)
RBC: 4.42 M/uL (ref 4.04–5.48)
RDW, POC: 12.9 %
WBC: 3.6 10*3/uL — AB (ref 4.6–10.2)

## 2014-08-29 LAB — HEPATIC FUNCTION PANEL
ALK PHOS: 145 U/L — AB (ref 39–117)
ALT: 129 U/L — ABNORMAL HIGH (ref 0–35)
AST: 47 U/L — AB (ref 0–37)
Albumin: 4 g/dL (ref 3.5–5.2)
BILIRUBIN DIRECT: 0.1 mg/dL (ref 0.0–0.3)
Indirect Bilirubin: 0.4 mg/dL (ref 0.2–1.2)
Total Bilirubin: 0.5 mg/dL (ref 0.2–1.2)
Total Protein: 6.4 g/dL (ref 6.0–8.3)

## 2014-08-29 LAB — COMPREHENSIVE METABOLIC PANEL
ALT: 206 U/L — ABNORMAL HIGH (ref 0–35)
AST: 78 U/L — AB (ref 0–37)
Albumin: 4.3 g/dL (ref 3.5–5.2)
Alkaline Phosphatase: 168 U/L — ABNORMAL HIGH (ref 39–117)
BUN: 13 mg/dL (ref 6–23)
CO2: 25 mEq/L (ref 19–32)
Calcium: 9.3 mg/dL (ref 8.4–10.5)
Chloride: 105 mEq/L (ref 96–112)
Creat: 1.01 mg/dL (ref 0.50–1.10)
Glucose, Bld: 102 mg/dL — ABNORMAL HIGH (ref 70–99)
POTASSIUM: 4.2 meq/L (ref 3.5–5.3)
Sodium: 144 mEq/L (ref 135–145)
Total Bilirubin: 0.8 mg/dL (ref 0.2–1.2)
Total Protein: 6.5 g/dL (ref 6.0–8.3)

## 2014-08-29 NOTE — Patient Instructions (Signed)
We will get you scan tomorrow am- I will let you know the results asap We will recheck your labs today and make sure no acute changes Let me know if you have any other concerns in the meantime

## 2014-08-29 NOTE — Progress Notes (Signed)
[facility] [address] [place] [address] [phone]  Date:  08/29/2014   Name:  Samantha Raymond   DOB:  11/04/1960   MRN:  [id number]  PCP:  Crecencio Mc, MD    Chief Complaint: Follow-up   History of Present Illness:  Samantha Raymond is a 53 y.o. very pleasant female patient who presents with the following:  2 days ago Samantha Raymond had mentioned a red rash on her bilateral legs that occurred after she played golf in the heat.  She tends to get this rash with exposure to heat and walking, maybe once a year after a summer golf game.  However it is not usually so widespread so she sought advice.  We did some labs and noted high LFTs, so she was seen in clinic today  The rash does not itch or hurt, but it does feel "tight."  Overall the rash is fading, is less red and seems to be going away Samantha Raymond has noted some mild elevation of her LFTs in the past, but it was always mild and thought to be due to fatty liver.  Her mother had gallbladder disease, but Samantha Raymond herself has never had this issue.    She has noted a vague feeling of not being 100% well for a couple of weeks. One day last week she noted some abdominal pain "like someone had punched me in the stomach" and went home early from work.  However otherwise she has not noted any particular GI symptoms except that her stools have been a bit green (not clay colored).  No diarrhea, vomiting, or anorexia.  She has not noted any worsening of her sx with eating or with any other triggers.  Appetite is normal.    Surgical history includes a laparoscopy for endometriosis and a c-section.      Recent labs as below:     Chemistry      Component Value Date/Time   NA 144 08/27/2014 1553   K 4.2 08/27/2014 1553   CL 105 08/27/2014 1553   CO2 25 08/27/2014 1553   BUN 13 08/27/2014 1553   CREATININE 1.01 08/27/2014 1553   CREATININE 0.8 11/03/2013 0856      Component Value Date/Time   CALCIUM 9.3 08/27/2014 1553    ALKPHOS 168* 08/27/2014 1553   AST 78* 08/27/2014 1553   ALT 206* 08/27/2014 1553   BILITOT 0.8 08/27/2014 1553      Results for orders placed or performed in visit on 08/27/14  Sedimentation Rate  Result Value Ref Range   Sed Rate 4 0 - 30 mm/hr  Rheumatoid factor  Result Value Ref Range   Rhuematoid fact SerPl-aCnc <10 <=14 IU/mL  C-reactive protein  Result Value Ref Range   CRP 1.2 (H) <0.60 mg/dL  CBC  Result Value Ref Range   WBC 3.3 (L) 4.0 - 10.5 K/uL   RBC 4.51 3.87 - 5.11 MIL/uL   Hemoglobin 13.8 12.0 - 15.0 g/dL   HCT 39.6 36.0 - 46.0 %   MCV 87.8 78.0 - 100.0 fL   MCH 30.6 26.0 - 34.0 pg   MCHC 34.8 30.0 - 36.0 g/dL   RDW 13.7 11.5 - 15.5 %   Platelets 207 150 - 400 K/uL   MPV 9.9 8.6 - 12.4 fL  POCT urinalysis dipstick  Result Value Ref Range   Color, UA Amber    Clarity, UA Clear    Glucose, UA Negative    Bilirubin,  UA Small    Ketones, UA Trace    Spec Grav, UA >=1.030    Blood, UA Negative    pH, UA 5.5    Protein, UA Negative    Urobilinogen, UA 0.2    Nitrite, UA Negative    Leukocytes, UA Negative Negative  POCT UA - Microscopic Only  Result Value Ref Range   WBC, Ur, HPF, POC negative    RBC, urine, microscopic Negative    Bacteria, U Microscopic Trace    Mucus, UA Negative    Epithelial cells, urine per micros Negative    Crystals, Ur, HPF, POC Negative    Casts, Ur, LPF, POC Negative    Yeast, UA Negative     Patient Active Problem List   Diagnosis Date Noted  . Elevated liver enzymes 11/05/2013  . 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 Unspecified vitamin D deficiency 08/24/2012  . Visit for preventive health examination 08/20/2012  . Menopause 08/17/2012  . Obesity (BMI 30-39.9) 08/09/2011  . Hypothyroidism 08/09/2011  . Recurrent urinary tract infection   . Neutropenia 08/07/2011    Past Medical History  Diagnosis Date  . hypothyroid   . Recurrent urinary tract infection     History reviewed. No pertinent past surgical  history.  History  Substance Use Topics  . Smoking status: Never Smoker   . Smokeless tobacco: Never Used  . Alcohol Use: 0.0 oz/week    0 Standard drinks or equivalent per week    Family History  Problem Relation Age of Onset  . Asthma Mother   . Diabetes Father   . Heart disease Father     chf,  no history of AMI  . Hyperlipidemia Father   . Arthritis Sister   . Cancer Sister 83    breast cancer , second diagnosis at 68  . Asthma Brother   . Stroke Maternal Aunt   . Cancer Maternal Aunt     breast ca    Allergies  Allergen Reactions  . Sulfa Antibiotics Nausea And Vomiting    Medication list has been reviewed and updated.  Current Outpatient Prescriptions on File Prior to Visit  Medication Sig Dispense Refill  . albuterol (PROVENTIL HFA;VENTOLIN HFA) 108 (90 BASE) MCG/ACT inhaler Inhale 2 puffs into the lungs every 6 (six) hours as needed for wheezing or shortness of breath. 1 Inhaler 11  . Calcium-Vitamin D-Vitamin K (VIACTIV PO) Take by mouth.    . Cholecalciferol (VITAMIN D3) 1000 UNITS CAPS Take 1 capsule by mouth daily.    Marland Kitchen escitalopram (LEXAPRO) 10 MG tablet TAKE 1 TABLET BY MOUTH ONCE DAILY 90 tablet 0  . Fluticasone-Salmeterol (ADVAIR DISKUS) 100-50 MCG/DOSE AEPB Inhale 1 puff into the lungs 2 (two) times daily. 180 each 0  . levothyroxine (SYNTHROID, LEVOTHROID) 25 MCG tablet TAKE 1 TABLET BY MOUTH ONCE DAILY MONDAY THROUGH FRIDAY 90 tablet 1  . Multiple Vitamin (MULTIVITAMIN) tablet Take 1 tablet by mouth daily.    Marland Kitchen triamcinolone (NASACORT) 55 MCG/ACT AERO nasal inhaler Place 2 sprays into the nose daily.     No current facility-administered medications on file prior to visit.    Review of Systems:  As per HPI- otherwise negative.   Physical Examination: Filed Vitals:   08/29/14 1602  BP: 118/76  Pulse: 76  Temp: 98 F (36.7 C)  Resp: 16   There were no vitals filed for this visit. There is no weight on file to calculate BMI. Ideal Body  Weight:    GEN: WDWN, NAD,  Non-toxic, A & O x 3, overweight, looks well HEENT: Atraumatic, Normocephalic. Neck supple. No masses, No LAD.  Bilateral TM wnl, oropharynx normal.  PEERL,EOMI.   Ears and Nose: No external deformity. CV: RRR, No M/G/R. No JVD. No thrill. No extra heart sounds. PULM: CTA B, no wheezes, crackles, rhonchi. No retractions. No resp. distress. No accessory muscle use. ABD: S, ND, +BS. No rebound. No HSM.  She notes mild discomfort with pressure on her RUQ, but negative murphy's.  Not an acute abdomen.  Liver does not seem enlarged  EXTR: No c/c/e NEURO Normal gait.  PSYCH: Normally interactive. Conversant. Not depressed or anxious appearing.  Calm demeanor.  Vasculitic rash on her bilateral legs appears to be fading and resolving when compared to Monday.  The rash is non- blanching and located in patches on her lower legs and ankles.  Feet are spared.   Results for orders placed or performed in visit on 08/29/14  POCT CBC  Result Value Ref Range   WBC 3.6 (A) 4.6 - 10.2 K/uL   Lymph, poc 1.2 0.6 - 3.4   POC LYMPH PERCENT 34.7 10 - 50 %L   MID (cbc) 0.3 0 - 0.9   POC MID % 8.8 0 - 12 %M   POC Granulocyte 2.0 2 - 6.9   Granulocyte percent 56.5 37 - 80 %G   RBC 4.42 4.04 - 5.48 M/uL   Hemoglobin 13.1 12.2 - 16.2 g/dL   HCT, POC 38.8 37.7 - 47.9 %   MCV 87.9 80 - 97 fL   MCH, POC 29.7 27 - 31.2 pg   MCHC 33.8 31.8 - 35.4 g/dL   RDW, POC 12.9 %   Platelet Count, POC 240 142 - 424 K/uL   MPV 7.0 0 - 99.8 fL   Assessment and Plan: Elevated LFTs - Plan: POCT CBC, Hepatic Function Panel, Acute Hep Panel & Hep B Surface Ab   Mistie has noted vague malaise, a day of abd pain last week, and a vasculitic rash.  Noted to have elevated LFTs on her labs from Monday.  She will go for a RUQ [place] tomorrow am to further evaluate for potential gallbladder or liver disease. Her vasculitic rash is fading and resolving at this point.  Repeat CBC is normal, await repeat LFTs  today  Signed Lamar Blinks, MD

## 2014-08-30 ENCOUNTER — Other Ambulatory Visit: Payer: Self-pay | Admitting: Family Medicine

## 2014-08-30 ENCOUNTER — Ambulatory Visit
Admission: RE | Admit: 2014-08-30 | Discharge: 2014-08-30 | Disposition: A | Discharge status: Home / Self Care | Payer: 59 | Source: Ambulatory Visit | Attending: Family Medicine | Admitting: Family Medicine

## 2014-08-30 DIAGNOSIS — I776 Arteritis, unspecified: Secondary | ICD-10-CM

## 2014-08-30 DIAGNOSIS — R740 Nonspecific elevation of levels of transaminase and lactic acid dehydrogenase [LDH]: Principal | ICD-10-CM

## 2014-08-30 DIAGNOSIS — R7989 Other specified abnormal findings of blood chemistry: Secondary | ICD-10-CM

## 2014-08-30 DIAGNOSIS — R945 Abnormal results of liver function studies: Principal | ICD-10-CM

## 2014-08-30 DIAGNOSIS — R7401 Elevation of levels of liver transaminase levels: Secondary | ICD-10-CM

## 2014-08-30 NOTE — Progress Notes (Signed)
Discussed her [place] results. Overall she continues to feel better   Little if any abd discomfort, and her rash continues to fade Asked her to have a lab draw tomorrow, we will follow-up on her LFTs and run a few more labs in an attempt to find any cause of these sx.  If none can be found likely due to viral process of some sort

## 2014-08-31 ENCOUNTER — Other Ambulatory Visit (INDEPENDENT_AMBULATORY_CARE_PROVIDER_SITE_OTHER): Payer: 59

## 2014-08-31 DIAGNOSIS — R7401 Elevation of levels of liver transaminase levels: Secondary | ICD-10-CM

## 2014-08-31 DIAGNOSIS — I776 Arteritis, unspecified: Secondary | ICD-10-CM | POA: Diagnosis not present

## 2014-08-31 DIAGNOSIS — D709 Neutropenia, unspecified: Secondary | ICD-10-CM | POA: Diagnosis not present

## 2014-08-31 DIAGNOSIS — R740 Nonspecific elevation of levels of transaminase and lactic acid dehydrogenase [LDH]: Secondary | ICD-10-CM | POA: Diagnosis not present

## 2014-08-31 LAB — FERRITIN: Ferritin: 128 ng/mL (ref 10–291)

## 2014-08-31 LAB — HEPATIC FUNCTION PANEL
ALBUMIN: 4.2 g/dL (ref 3.5–5.2)
ALK PHOS: 134 U/L — AB (ref 39–117)
ALT: 83 U/L — ABNORMAL HIGH (ref 0–35)
AST: 32 U/L (ref 0–37)
BILIRUBIN DIRECT: 0.1 mg/dL (ref 0.0–0.3)
BILIRUBIN TOTAL: 0.5 mg/dL (ref 0.2–1.2)
Indirect Bilirubin: 0.4 mg/dL (ref 0.2–1.2)
TOTAL PROTEIN: 6.4 g/dL (ref 6.0–8.3)

## 2014-09-01 ENCOUNTER — Telehealth: Payer: Self-pay | Admitting: Physician Assistant

## 2014-09-01 NOTE — Telephone Encounter (Signed)
Received the SST, but missing lavender top tube. Do we have the lavender tube? Does the patient need to have that re-drawn?  They performed the orders on the SST tube.  Reference number: [id number]

## 2014-09-01 NOTE — Telephone Encounter (Signed)
Returned call regarding lab review on this patient.

## 2014-09-02 NOTE — Telephone Encounter (Signed)
Please call patient and arrange for re-draw on Monday at her convenience.

## 2014-09-02 NOTE — Telephone Encounter (Signed)
Look like that it was for her CBC.  Pt needs to come back in for redraw.

## 2014-09-03 ENCOUNTER — Telehealth: Payer: Self-pay | Admitting: Family Medicine

## 2014-09-03 LAB — CERULOPLASMIN: Ceruloplasmin: 27 mg/dL (ref 18–53)

## 2014-09-03 LAB — CBC WITH DIFFERENTIAL/PLATELET

## 2014-09-03 LAB — ANA: Anti Nuclear Antibody(ANA): NEGATIVE

## 2014-09-03 LAB — HEPATITIS PANEL, ACUTE
HCV Ab: NEGATIVE
Hep A IgM: NONREACTIVE
Hep B C IgM: NONREACTIVE
Hepatitis B Surface Ag: NEGATIVE

## 2014-09-03 LAB — MITOCHONDRIAL ANTIBODIES: Mitochondrial M2 Ab, IgG: 0.6 (ref <0.91)

## 2014-09-03 NOTE — Telephone Encounter (Signed)
Added this test on

## 2014-09-03 NOTE — Telephone Encounter (Signed)
Dr Lorelei Pont states that she doesn't need a CBC on this patient because it was already done in house i did add on an Acute Hep panel today per Dr Lorelei Pont

## 2014-09-04 LAB — B. BURGDORFI ANTIBODIES BY WB
B burgdorferi IgG Abs (IB): NEGATIVE
B burgdorferi IgM Abs (IB): NEGATIVE

## 2014-09-05 ENCOUNTER — Other Ambulatory Visit: Payer: Self-pay | Admitting: *Deleted

## 2014-09-05 ENCOUNTER — Other Ambulatory Visit: Payer: Self-pay | Admitting: Internal Medicine

## 2014-09-05 MED ORDER — ESCITALOPRAM OXALATE 10 MG PO TABS: 10.0000 mg | ORAL_TABLET | Freq: Every day | ORAL | Status: DC

## 2014-09-05 NOTE — Telephone Encounter (Signed)
Has appointment 10/08/14 ok to refill lexapro?

## 2014-09-06 ENCOUNTER — Encounter: Payer: Self-pay | Admitting: Family Medicine

## 2014-09-10 ENCOUNTER — Encounter: Payer: Self-pay | Admitting: Family Medicine

## 2014-09-10 ENCOUNTER — Other Ambulatory Visit: Payer: Self-pay | Admitting: Family Medicine

## 2014-09-10 DIAGNOSIS — R740 Nonspecific elevation of levels of transaminase and lactic acid dehydrogenase [LDH]: Principal | ICD-10-CM

## 2014-09-10 DIAGNOSIS — R7401 Elevation of levels of liver transaminase levels: Secondary | ICD-10-CM

## 2014-09-10 NOTE — Progress Notes (Signed)
Spoke to pt at clinic today- she continues to do well.  She played golf again in the heat over the weekend but tried hard to stay cool, used ice water towels to cool herself off between holes. As a result she developed only a tiny area of rash on her ankle.   Overall she feels fine again

## 2014-09-11 ENCOUNTER — Other Ambulatory Visit: Payer: Self-pay | Admitting: *Deleted

## 2014-09-11 ENCOUNTER — Other Ambulatory Visit: Payer: Self-pay | Admitting: Family Medicine

## 2014-09-11 DIAGNOSIS — R740 Nonspecific elevation of levels of transaminase and lactic acid dehydrogenase [LDH]: Principal | ICD-10-CM

## 2014-09-11 DIAGNOSIS — R7401 Elevation of levels of liver transaminase levels: Secondary | ICD-10-CM

## 2014-09-12 LAB — HEPATIC FUNCTION PANEL
ALK PHOS: 102 U/L (ref 33–130)
ALT: 39 U/L — AB (ref 6–29)
AST: 33 U/L (ref 10–35)
Albumin: 4.6 g/dL (ref 3.6–5.1)
BILIRUBIN TOTAL: 0.4 mg/dL (ref 0.2–1.2)
Bilirubin, Direct: 0.1 mg/dL (ref <=0.2)
Indirect Bilirubin: 0.3 mg/dL (ref 0.2–1.2)
TOTAL PROTEIN: 6.7 g/dL (ref 6.1–8.1)

## 2014-10-08 ENCOUNTER — Ambulatory Visit (INDEPENDENT_AMBULATORY_CARE_PROVIDER_SITE_OTHER): Payer: 59 | Admitting: Internal Medicine

## 2014-10-08 ENCOUNTER — Encounter: Payer: Self-pay | Admitting: Internal Medicine

## 2014-10-08 VITALS — BP 124/76 | HR 63 | Temp 98.3°F | Resp 14 | Ht 63.25 in | Wt 200.2 lb

## 2014-10-08 DIAGNOSIS — Z803 Family history of malignant neoplasm of breast: Secondary | ICD-10-CM

## 2014-10-08 DIAGNOSIS — K760 Fatty (change of) liver, not elsewhere classified: Secondary | ICD-10-CM

## 2014-10-08 DIAGNOSIS — Z0000 Encounter for general adult medical examination without abnormal findings: Secondary | ICD-10-CM | POA: Diagnosis not present

## 2014-10-08 DIAGNOSIS — E038 Other specified hypothyroidism: Secondary | ICD-10-CM

## 2014-10-08 DIAGNOSIS — E785 Hyperlipidemia, unspecified: Secondary | ICD-10-CM | POA: Diagnosis not present

## 2014-10-08 DIAGNOSIS — E034 Atrophy of thyroid (acquired): Secondary | ICD-10-CM | POA: Diagnosis not present

## 2014-10-08 DIAGNOSIS — Z87828 Personal history of other (healed) physical injury and trauma: Secondary | ICD-10-CM

## 2014-10-08 DIAGNOSIS — E860 Dehydration: Secondary | ICD-10-CM | POA: Diagnosis not present

## 2014-10-08 DIAGNOSIS — E559 Vitamin D deficiency, unspecified: Secondary | ICD-10-CM | POA: Diagnosis not present

## 2014-10-08 DIAGNOSIS — Z9189 Other specified personal risk factors, not elsewhere classified: Secondary | ICD-10-CM

## 2014-10-08 DIAGNOSIS — R5383 Other fatigue: Secondary | ICD-10-CM

## 2014-10-08 DIAGNOSIS — E669 Obesity, unspecified: Secondary | ICD-10-CM

## 2014-10-08 LAB — CBC WITH DIFFERENTIAL/PLATELET
Basophils Absolute: 0 10*3/uL (ref 0.0–0.1)
Basophils Relative: 0.9 % (ref 0.0–3.0)
EOS PCT: 1.4 % (ref 0.0–5.0)
Eosinophils Absolute: 0.1 10*3/uL (ref 0.0–0.7)
HCT: 42.1 % (ref 36.0–46.0)
Hemoglobin: 14.4 g/dL (ref 12.0–15.0)
LYMPHS ABS: 1.4 10*3/uL (ref 0.7–4.0)
Lymphocytes Relative: 34.4 % (ref 12.0–46.0)
MCHC: 34.2 g/dL (ref 30.0–36.0)
MCV: 89.9 fl (ref 78.0–100.0)
MONOS PCT: 8.4 % (ref 3.0–12.0)
Monocytes Absolute: 0.3 10*3/uL (ref 0.1–1.0)
Neutro Abs: 2.2 10*3/uL (ref 1.4–7.7)
Neutrophils Relative %: 54.9 % (ref 43.0–77.0)
Platelets: 240 10*3/uL (ref 150.0–400.0)
RBC: 4.68 Mil/uL (ref 3.87–5.11)
RDW: 12.8 % (ref 11.5–15.5)
WBC: 4 10*3/uL (ref 4.0–10.5)

## 2014-10-08 LAB — TSH: TSH: 0.36 u[IU]/mL (ref 0.35–4.50)

## 2014-10-08 LAB — COMPREHENSIVE METABOLIC PANEL
ALT: 39 U/L — ABNORMAL HIGH (ref 0–35)
AST: 32 U/L (ref 0–37)
Albumin: 4.4 g/dL (ref 3.5–5.2)
Alkaline Phosphatase: 93 U/L (ref 39–117)
BUN: 12 mg/dL (ref 6–23)
CHLORIDE: 106 meq/L (ref 96–112)
CO2: 28 meq/L (ref 19–32)
CREATININE: 0.78 mg/dL (ref 0.40–1.20)
Calcium: 9.4 mg/dL (ref 8.4–10.5)
GFR: 81.33 mL/min (ref >60.00)
Glucose, Bld: 99 mg/dL (ref 70–99)
Potassium: 4.6 mEq/L (ref 3.5–5.1)
Sodium: 142 mEq/L (ref 135–145)
Total Bilirubin: 0.6 mg/dL (ref 0.2–1.2)
Total Protein: 6.5 g/dL (ref 6.0–8.3)

## 2014-10-08 LAB — LIPID PANEL
CHOL/HDL RATIO: 3
Cholesterol: 201 mg/dL — ABNORMAL HIGH (ref 0–200)
HDL: 62.2 mg/dL (ref >39.00)
LDL Cholesterol: 122 mg/dL — ABNORMAL HIGH (ref 0–99)
NonHDL: 138.8
TRIGLYCERIDES: 84 mg/dL (ref 0.0–149.0)
VLDL: 16.8 mg/dL (ref 0.0–40.0)

## 2014-10-08 LAB — MAGNESIUM: Magnesium: 2.2 mg/dL (ref 1.5–2.5)

## 2014-10-08 LAB — VITAMIN D 25 HYDROXY (VIT D DEFICIENCY, FRACTURES): VITD: 29.45 ng/mL — ABNORMAL LOW (ref 30.00–100.00)

## 2014-10-08 NOTE — Progress Notes (Signed)
Patient ID: Samantha Raymond, female    DOB: 1960-09-22  Age: 54 y.o. MRN: [id number]  The patient is here for annual  wellness examination and management of other chronic and acute problems.   The risk factors are reflected in the social history.  The roster of all physicians providing medical care to patient - is listed in the Snapshot section of the chart.  Activities of daily living:  The patient is 100% independent in all ADLs: dressing, toileting, feeding as well as independent mobility  Home safety : The patient has smoke detectors in the home. They wear seatbelts.  There are no firearms at home. There is no violence in the home.   There is no risks for hepatitis, STDs or HIV. There is no   history of blood transfusion. They have no travel history to infectious disease endemic areas of the world.  The patient has seen their dentist in the last six month. They have seen their eye doctor in the last year. They admit to slight hearing difficulty with regard to whispered voices and some television programs.  They have deferred audiologic testing in the last year.  They do not  have excessive sun exposure. Discussed the need for sun protection: hats, long sleeves and use of sunscreen if there is significant sun exposure.   Diet: the importance of a healthy diet is discussed. They do have a healthy diet.  The benefits of regular aerobic exercise were discussed. She walks 4 times per week ,  20 minutes.   Depression screen: there are no signs or vegative symptoms of depression- irritability, change in appetite, anhedonia, sadness/tearfullness.  Cognitive assessment: the patient manages all their financial and personal affairs and is actively engaged. They could relate day,date,year and events; recalled 2/3 objects at 3 minutes; performed clock-face test normally.  The following portions of the patient's history were reviewed and updated as appropriate: allergies, current medications, past  family history, past medical history,  past surgical history, past social history  and problem list.  Visual acuity was not assessed per patient preference since she has regular follow up with her ophthalmologist. Hearing and body mass index were assessed and reviewed.   During the course of the visit the patient was educated and counseled about appropriate screening and preventive services including : fall prevention , diabetes screening, nutrition counseling, colorectal cancer screening, and recommended immunizations.    CC: The primary encounter diagnosis was Visit for preventive health examination. Diagnoses of Dehydration, Other fatigue, Vitamin D deficiency, Hyperlipidemia LDL goal <130, Family history of breast cancer in first degree relative, Fatty liver disease, nonalcoholic, Obesity (BMI 42-68.3), Hypothyroidism due to acquired atrophy of thyroid, and History of heat stroke were also pertinent to this visit.  History Harlee has a past medical history of hypothyroid and Recurrent urinary tract infection.   She has no past surgical history on file.   Her family history includes Arthritis in her sister; Asthma in her brother and mother; Cancer in her maternal aunt; Cancer (age of onset: 43) in her sister; Diabetes in her father; Heart disease in her father; Hyperlipidemia in her father; Stroke in her maternal aunt.She reports that she has never smoked. She has never used smokeless tobacco. She reports that she drinks alcohol. She reports that she does not use illicit drugs.  Outpatient Prescriptions Prior to Visit  Medication Sig Dispense Refill  . albuterol (PROVENTIL HFA;VENTOLIN HFA) 108 (90 BASE) MCG/ACT inhaler Inhale 2 puffs into the lungs every 6 (six)  hours as needed for wheezing or shortness of breath. 1 Inhaler 11  . Calcium-Vitamin D-Vitamin K (VIACTIV PO) Take by mouth.    . Cholecalciferol (VITAMIN D3) 1000 UNITS CAPS Take 1 capsule by mouth daily.    Marland Kitchen escitalopram (LEXAPRO)  10 MG tablet Take 1 tablet (10 mg total) by mouth daily. 90 tablet 0  . Fluticasone-Salmeterol (ADVAIR DISKUS) 100-50 MCG/DOSE AEPB Inhale 1 puff into the lungs 2 (two) times daily. 180 each 0  . levothyroxine (SYNTHROID, LEVOTHROID) 25 MCG tablet TAKE 1 TABLET BY MOUTH ONCE DAILY MONDAY THROUGH FRIDAY 90 tablet 1  . Multiple Vitamin (MULTIVITAMIN) tablet Take 1 tablet by mouth daily.    Marland Kitchen triamcinolone (NASACORT) 55 MCG/ACT AERO nasal inhaler Place 2 sprays into the nose daily.     No facility-administered medications prior to visit.    Review of Systems   Patient denies headache, fevers, malaise, unintentional weight loss, skin rash, eye pain, sinus congestion and sinus pain, sore throat, dysphagia,  hemoptysis , cough, dyspnea, wheezing, chest pain, palpitations, orthopnea, edema, abdominal pain, nausea, melena, diarrhea, constipation, flank pain, dysuria, hematuria, urinary  Frequency, nocturia, numbness, tingling, seizures,  Focal weakness, Loss of consciousness,  Tremor, insomnia, depression, anxiety, and suicidal ideation.      Objective:  BP 124/76 mmHg  Pulse 63  Temp(Src) 98.3 F (36.8 C) (Oral)  Resp 14  Ht 5' 3.25" (1.607 m)  Wt 200 lb 4 oz (90.833 kg)  BMI 35.17 kg/m2  SpO2 96%  Physical Exam General appearance: alert, cooperative and appears stated age Head: Normocephalic, without obvious abnormality, atraumatic Eyes: conjunctivae/corneas clear. PERRL, EOM's intact. Fundi benign. Ears: normal TM's and external ear canals both ears Nose: Nares normal. Septum midline. Mucosa normal. No drainage or sinus tenderness. Throat: lips, mucosa, and tongue normal; teeth and gums normal Neck: no adenopathy, no carotid bruit, no JVD, supple, symmetrical, trachea midline and thyroid not enlarged, symmetric, no tenderness/mass/nodules Lungs: clear to auscultation bilaterally Breasts: normal appearance, no masses or tenderness Heart: regular rate and rhythm, S1, S2 normal, no murmur,  click, rub or gallop Abdomen: soft, non-tender; bowel sounds normal; no masses,  no organomegaly Extremities: extremities normal, atraumatic, no cyanosis or edema Pulses: 2+ and symmetric Skin: Skin color, texture, turgor normal. No rashes or lesions Neurologic: Alert and oriented X 3, normal strength and tone. Normal symmetric reflexes. Normal coordination and gait.     Assessment & Plan:   Problem List Items Addressed This Visit      Unprioritized   Obesity (BMI 30-39.9)    I have addressed  BMI and recommended wt loss of 10% of body weight over the next 6 months using a low fat, fruit/vegetable based [place] diet and regular exercise a minimum of 5 days per week.        Hypothyroidism    Thyroid function is normal on current dose. No changes today   Lab Results  Component Value Date   TSH 0.36 10/08/2014         Visit for preventive health examination - Primary    Annual comprehensive preventive exam was done as well as an evaluation and management of chronic conditions .  During the course of the visit the patient was educated and counseled about appropriate screening and preventive services including :  diabetes screening, lipid analysis with projected  10 year  risk for CAD  Which is 4.7 % using the Framingham risk calculator for women, , nutrition counseling, colorectal cancer screening, and recommended immunizations.  Printed recommendations for health maintenance screenings was given.       Fatty liver disease, nonalcoholic    Presumed by ultrasound changes and serologies negative for autoimmune causes of hepatitis.  Current liver enzymes are almost normal and all modifiable risk factors including obesity  and hyperlipidemia have been addressed   Lab Results  Component Value Date   ALT 39* 10/08/2014   AST 32 10/08/2014   ALKPHOS 93 10/08/2014   BILITOT 0.6 10/08/2014          Family history of breast cancer in first degree relative    Annual 3D mammogram  advised and ordered      History of heat stroke    She reports an episode of severe dehydration and heat stroke over the weekend while golfing.  Electrolytes are normal today   Lab Results  Component Value Date   NA 142 10/08/2014   K 4.6 10/08/2014   CL 106 10/08/2014   CO2 28 10/08/2014   Lab Results  Component Value Date   CREATININE 0.78 10/08/2014          Other Visit Diagnoses    Dehydration        Relevant Orders    Comprehensive metabolic panel (Completed)    Other fatigue        Relevant Orders    CBC with Differential/Platelet (Completed)    TSH (Completed)    Magnesium (Completed)    Vitamin D deficiency        Relevant Orders    Vit D  25 hydroxy (rtn osteoporosis monitoring) (Completed)    Hyperlipidemia LDL goal <130        Relevant Orders    Lipid panel (Completed)       I am having Ms. Soulliere maintain her Calcium-Vitamin D-Vitamin K (VIACTIV PO), multivitamin, Vitamin D3, Fluticasone-Salmeterol, albuterol, levothyroxine, triamcinolone, and escitalopram.  No orders of the defined types were placed in this encounter.    There are no discontinued medications.  Follow-up: No Follow-up on file.   Crecencio Mc, MD

## 2014-10-08 NOTE — Patient Instructions (Addendum)
Check with Employee Health about your Hep A and B titres  The  diet I discussed with you today is the 10 day Green Smoothie Cleansing /Detox Diet by Linden Dolin . available on [place] [place] for around $10.  This is not a low carb or a weight loss diet,  It is fundamentally a "cleansing" low fat diet that eliminates sugar, gluten, caffeine, alcohol and dairy for 10 days .  What you add back after the initial ten days is entirely up to  you!  You can expect to lose 5 to 10 lbs depending on how strict you are.   I suggest drinking 2 smoothies daily and keeping one chewable meal (but keep it simple, like baked fish and salad, rice or bok choy) .  You snack primarily on fresh  fruit, egg whites and judicious quantities of nuts. You can add vegetable based protein powder (nothing with whey , since whey is dairy) in it.  [employer] has a Research officer, political party .    It does require some form of a nutrient extractor (Vita Mix, a electric juicer,  Or a Nutribullet Rx).  i have found that using frozen fruits is much more convenient and cost effective. You can even find plenty of organic fruit in the frozen fruit section of [employer].  Just thaw what you need for the following day the night before in the refrigerator (to avoid jamming up your machine)

## 2014-10-08 NOTE — Progress Notes (Signed)
Pre-visit discussion using our clinic review tool. No additional management support is needed unless otherwise documented below in the visit note.  

## 2014-10-09 DIAGNOSIS — Z87828 Personal history of other (healed) physical injury and trauma: Secondary | ICD-10-CM | POA: Insufficient documentation

## 2014-10-09 NOTE — Assessment & Plan Note (Signed)
Presumed by ultrasound changes and serologies negative for autoimmune causes of hepatitis.  Current liver enzymes are almost normal and all modifiable risk factors including obesity  and hyperlipidemia have been addressed   Lab Results  Component Value Date   ALT 39* 10/08/2014   AST 32 10/08/2014   ALKPHOS 93 10/08/2014   BILITOT 0.6 10/08/2014

## 2014-10-09 NOTE — Assessment & Plan Note (Signed)
She reports an episode of severe dehydration and heat stroke over the weekend while golfing.  Electrolytes are normal today   Lab Results  Component Value Date   NA 142 10/08/2014   K 4.6 10/08/2014   CL 106 10/08/2014   CO2 28 10/08/2014   Lab Results  Component Value Date   CREATININE 0.78 10/08/2014

## 2014-10-09 NOTE — Assessment & Plan Note (Addendum)
Annual comprehensive preventive exam was done as well as an evaluation and management of chronic conditions .  During the course of the visit the patient was educated and counseled about appropriate screening and preventive services including :  diabetes screening, lipid analysis with projected  10 year  risk for CAD  Which is 4.7 % using the Framingham risk calculator for women, , nutrition counseling, colorectal cancer screening, and recommended immunizations.  Printed recommendations for health maintenance screenings was given.  

## 2014-10-09 NOTE — Assessment & Plan Note (Signed)
I have addressed  BMI and recommended wt loss of 10% of body weight over the next 6 months using a low fat, fruit/vegetable based Mediteranean diet and regular exercise a minimum of 5 days per week.   

## 2014-10-09 NOTE — Assessment & Plan Note (Signed)
Thyroid function is normal on current dose. No changes today   Lab Results  Component Value Date   TSH 0.36 10/08/2014

## 2014-10-09 NOTE — Assessment & Plan Note (Signed)
Annual 3D mammogram advised and ordered

## 2014-10-10 ENCOUNTER — Encounter: Payer: Self-pay | Admitting: Internal Medicine

## 2014-10-24 ENCOUNTER — Ambulatory Visit (INDEPENDENT_AMBULATORY_CARE_PROVIDER_SITE_OTHER): Payer: 59 | Admitting: Family Medicine

## 2014-10-24 VITALS — BP 112/80 | HR 63 | Temp 98.5°F | Resp 18 | Ht 63.25 in | Wt 200.0 lb

## 2014-10-24 DIAGNOSIS — J209 Acute bronchitis, unspecified: Secondary | ICD-10-CM

## 2014-10-24 DIAGNOSIS — J4521 Mild intermittent asthma with (acute) exacerbation: Secondary | ICD-10-CM | POA: Diagnosis not present

## 2014-10-24 MED ORDER — BENZONATATE 200 MG PO CAPS: 200.0000 mg | ORAL_CAPSULE | Freq: Three times a day (TID) | ORAL | Status: DC | PRN

## 2014-10-24 MED ORDER — AZITHROMYCIN 250 MG PO TABS: ORAL_TABLET | ORAL | Status: DC

## 2014-10-24 MED ORDER — PREDNISONE 20 MG PO TABS: 20.0000 mg | ORAL_TABLET | Freq: Every day | ORAL | Status: DC

## 2014-10-24 MED ORDER — HYDROCOD POLST-CPM POLST ER 10-8 MG/5ML PO SUER: 5.0000 mL | Freq: Every evening | ORAL | Status: DC | PRN

## 2014-10-24 NOTE — Patient Instructions (Signed)
Acute Bronchitis Bronchitis is inflammation of the airways that extend from the windpipe into the lungs (bronchi). The inflammation often causes mucus to develop. This leads to a cough, which is the most common symptom of bronchitis.  In acute bronchitis, the condition usually develops suddenly and goes away over time, usually in a couple weeks. Smoking, allergies, and asthma can make bronchitis worse. Repeated episodes of bronchitis may cause further lung problems.  CAUSES Acute bronchitis is most often caused by the same virus that causes a cold. The virus can spread from person to person (contagious) through coughing, sneezing, and touching contaminated objects. SIGNS AND SYMPTOMS   Cough.   Fever.   Coughing up mucus.   Body aches.   Chest congestion.   Chills.   Shortness of breath.   Sore throat.  DIAGNOSIS  Acute bronchitis is usually diagnosed through a physical exam. Your health care provider will also ask you questions about your medical history. Tests, such as chest X-rays, are sometimes done to rule out other conditions.  TREATMENT  Acute bronchitis usually goes away in a couple weeks. Oftentimes, no medical treatment is necessary. Medicines are sometimes given for relief of fever or cough. Antibiotic medicines are usually not needed but may be prescribed in certain situations. In some cases, an inhaler may be recommended to help reduce shortness of breath and control the cough. A cool mist vaporizer may also be used to help thin bronchial secretions and make it easier to clear the chest.  HOME CARE INSTRUCTIONS  Get plenty of rest.   Drink enough fluids to keep your urine clear or pale yellow (unless you have a medical condition that requires fluid restriction). Increasing fluids may help thin your respiratory secretions (sputum) and reduce chest congestion, and it will prevent dehydration.   Take medicines only as directed by your health care provider.  If  you were prescribed an antibiotic medicine, finish it all even if you start to feel better.  Avoid smoking and secondhand smoke. Exposure to cigarette smoke or irritating chemicals will make bronchitis worse. If you are a smoker, consider using nicotine gum or skin patches to help control withdrawal symptoms. Quitting smoking will help your lungs heal faster.   Reduce the chances of another bout of acute bronchitis by washing your hands frequently, avoiding people with cold symptoms, and trying not to touch your hands to your mouth, nose, or eyes.   Keep all follow-up visits as directed by your health care provider.  SEEK MEDICAL CARE IF: Your symptoms do not improve after 1 week of treatment.  SEEK IMMEDIATE MEDICAL CARE IF:  You develop an increased fever or chills.   You have chest pain.   You have severe shortness of breath.  You have bloody sputum.   You develop dehydration.  You faint or repeatedly feel like you are going to pass out.  You develop repeated vomiting.  You develop a severe headache. MAKE SURE YOU:   Understand these instructions.  Will watch your condition.  Will get help right away if you are not doing well or get worse. Document Released: 03/12/2004 Document Revised: 06/19/2013 Document Reviewed: 07/26/2012 [facility] Patient Information 2015 [place], [place]. This information is not intended to replace advice given to you by your health care provider. Make sure you discuss any questions you have with your health care provider. Asthma, Acute Bronchospasm Acute bronchospasm caused by asthma is also referred to as an asthma attack. Bronchospasm means your air passages become narrowed. The  narrowing is caused by inflammation and tightening of the muscles in the air tubes (bronchi) in your lungs. This can make it hard to breathe or cause you to wheeze and cough. CAUSES Possible triggers are:  Animal dander from the skin, hair, or feathers of  animals.  Dust mites contained in house dust.  Cockroaches.  Pollen from trees or grass.  Mold.  Cigarette or tobacco smoke.  Air pollutants such as dust, household cleaners, hair sprays, aerosol sprays, paint fumes, strong chemicals, or strong odors.  Cold air or weather changes. Cold air may trigger inflammation. Winds increase molds and pollens in the air.  Strong emotions such as crying or laughing hard.  Stress.  Certain medicines such as aspirin or beta-blockers.  Sulfites in foods and drinks, such as dried fruits and wine.  Infections or inflammatory conditions, such as a flu, cold, or inflammation of the nasal membranes (rhinitis).  Gastroesophageal reflux disease (GERD). GERD is a condition where stomach acid backs up into your esophagus.  Exercise or strenuous activity. SIGNS AND SYMPTOMS   Wheezing.  Excessive coughing, particularly at night.  Chest tightness.  Shortness of breath. DIAGNOSIS  Your health care provider will ask you about your medical history and perform a physical exam. A chest X-ray or blood testing may be performed to look for other causes of your symptoms or other conditions that may have triggered your asthma attack. TREATMENT  Treatment is aimed at reducing inflammation and opening up the airways in your lungs. Most asthma attacks are treated with inhaled medicines. These include quick relief or rescue medicines (such as bronchodilators) and controller medicines (such as inhaled corticosteroids). These medicines are sometimes given through an inhaler or a nebulizer. Systemic steroid medicine taken by mouth or given through an IV tube also can be used to reduce the inflammation when an attack is moderate or severe. Antibiotic medicines are only used if a bacterial infection is present.  HOME CARE INSTRUCTIONS   Rest.  Drink plenty of liquids. This helps the mucus to remain thin and be easily coughed up. Only use caffeine in moderation and  do not use alcohol until you have recovered from your illness.  Do not smoke. Avoid being exposed to secondhand smoke.  You play a critical role in keeping yourself in good health. Avoid exposure to things that cause you to wheeze or to have breathing problems.  Keep your medicines up-to-date and available. Carefully follow your health care provider's treatment plan.  Take your medicine exactly as prescribed.  When pollen or pollution is bad, keep windows closed and use an air conditioner or go to places with air conditioning.  Asthma requires careful medical care. See your health care provider for a follow-up as advised. If you are more than [redacted] weeks pregnant and you were prescribed any new medicines, let your obstetrician know about the visit and how you are doing. Follow up with your health care provider as directed.  After you have recovered from your asthma attack, make an appointment with your outpatient doctor to talk about ways to reduce the likelihood of future attacks. If you do not have a doctor who manages your asthma, make an appointment with a primary care doctor to discuss your asthma. SEEK IMMEDIATE MEDICAL CARE IF:   You are getting worse.  You have trouble breathing. If severe, call your local emergency services (911 in the [place]  You develop chest pain or discomfort.  You are vomiting.  You are not able to  keep fluids down.  You are coughing up yellow, green, brown, or bloody sputum.  You have a fever and your symptoms suddenly get worse.  You have trouble swallowing. MAKE SURE YOU:   Understand these instructions.  Will watch your condition.  Will get help right away if you are not doing well or get worse. Document Released: 05/20/2006 Document Revised: 02/07/2013 Document Reviewed: 08/10/2012 [facility] [place] [facility] Patient Information 2015 [place], [place]. This information is not intended to replace advice given to you by your health care provider. Make sure you  discuss any questions you have with your health care provider.

## 2014-10-24 NOTE — Progress Notes (Signed)
Subjective:    Patient ID: Samantha Raymond, female    DOB: 07/14/1960, 54 y.o.   MRN: [id number] This chart was scribed for Delman Cheadle, MD by Zola Button, Medical Scribe. This patient was seen in Room 5 and the patient's care was started at 9:02 AM.   Chief Complaint  Patient presents with  . Cough    Onset 1.5 weeks    HPI HPI Comments: Samantha Raymond is a 53 y.o. female with a history of asthma who presents to the [facility] complaining of gradual onset cough that started about a week ago, but worsened this morning. Patient also reports having associated congestion and loss of voice. The cough does not interrupt her sleep. She does not feel ill otherwise. She has not tried any OTC medications for her symptoms. Patient denies fever and chills. She has not been on a course of steroids recently. She takes her Advair only during the winter typically.   She has been playing golf for the past 6 years.   Past Medical History  Diagnosis Date  . hypothyroid   . Recurrent urinary tract infection    Current Outpatient Prescriptions on File Prior to Visit  Medication Sig Dispense Refill  . albuterol (PROVENTIL HFA;VENTOLIN HFA) 108 (90 BASE) MCG/ACT inhaler Inhale 2 puffs into the lungs every 6 (six) hours as needed for wheezing or shortness of breath. 1 Inhaler 11  . Calcium-Vitamin D-Vitamin K (VIACTIV PO) Take by mouth.    . Cholecalciferol (VITAMIN D3) 1000 UNITS CAPS Take 1 capsule by mouth daily.    Marland Kitchen escitalopram (LEXAPRO) 10 MG tablet Take 1 tablet (10 mg total) by mouth daily. 90 tablet 0  . Fluticasone-Salmeterol (ADVAIR DISKUS) 100-50 MCG/DOSE AEPB Inhale 1 puff into the lungs 2 (two) times daily. 180 each 0  . levothyroxine (SYNTHROID, LEVOTHROID) 25 MCG tablet TAKE 1 TABLET BY MOUTH ONCE DAILY MONDAY THROUGH FRIDAY 90 tablet 1  . Multiple Vitamin (MULTIVITAMIN) tablet Take 1 tablet by mouth daily.    Marland Kitchen triamcinolone (NASACORT) 55 MCG/ACT AERO nasal inhaler  Place 2 sprays into the nose daily.     No current facility-administered medications on file prior to visit.   Allergies  Allergen Reactions  . Sulfa Antibiotics Nausea And Vomiting    Review of Systems  Constitutional: Negative for fever, chills, diaphoresis, activity change, appetite change, fatigue and unexpected weight change.  HENT: Positive for congestion and voice change. Negative for trouble swallowing.   Respiratory: Positive for cough.   Cardiovascular: Negative for palpitations.  Gastrointestinal: Negative for diarrhea and constipation.  Genitourinary: Negative for frequency and decreased urine volume.  Skin: Negative for color change, pallor and rash.  Neurological: Negative for tremors, syncope, weakness and numbness.  Psychiatric/Behavioral: Negative for sleep disturbance and decreased concentration. The patient is not nervous/anxious.        Objective:  BP 112/80 mmHg  Pulse 63  Temp(Src) 98.5 F (36.9 C) (Oral)  Resp 18  Ht 5' 3.25" (1.607 m)  Wt 200 lb (90.719 kg)  BMI 35.13 kg/m2  SpO2 98%  Physical Exam  Constitutional: She is oriented to person, place, and time. She appears well-developed and well-nourished. No distress.  HENT:  Head: Normocephalic and atraumatic.  Right Ear: Tympanic membrane normal.  Left Ear: Tympanic membrane normal.  Nose: Mucosal edema and rhinorrhea present.  Mouth/Throat: Oropharynx is clear and moist. No oropharyngeal exudate.  Eyes: Pupils are equal, round, and reactive to light.  Neck: Neck supple.  Cardiovascular: Normal rate, regular rhythm, S1 normal, S2 normal and normal heart sounds.   Pulmonary/Chest: Effort normal. No respiratory distress. She has no wheezes. She has no rales.  Clear to auscultation bilaterally. Good air movement in the upper lobes, but slightly decreased air movement in the bases bilaterally.  Musculoskeletal: She exhibits no edema.  Lymphadenopathy:    She has no cervical adenopathy.    Neurological: She is alert and oriented to person, place, and time. No cranial nerve deficit.  Skin: Skin is warm and dry. No rash noted.  Psychiatric: She has a normal mood and affect. Her behavior is normal.  Nursing note and vitals reviewed.         Assessment & Plan:   1. Airway hyperreactivity, mild intermittent, with acute exacerbation   2. Acute bronchitis, unspecified organism   gave snap rx for zpack in case sxs persist/worsen but suspect viral so try symptomatic care reviewed.  Meds ordered this encounter  Medications  . predniSONE (DELTASONE) 20 MG tablet    Sig: Take 1 tablet (20 mg total) by mouth daily with breakfast.    Dispense:  10 tablet    Refill:  0  . azithromycin (ZITHROMAX) 250 MG tablet    Sig: Take 2 tabs PO x 1 dose, then 1 tab PO QD x 4 days    Dispense:  6 tablet    Refill:  0  . chlorpheniramine-HYDROcodone (TUSSIONEX PENNKINETIC ER) 10-8 MG/5ML SUER    Sig: Take 5 mLs by mouth at bedtime as needed for cough.    Dispense:  90 mL    Refill:  0  . benzonatate (TESSALON) 200 MG capsule    Sig: Take 1 capsule (200 mg total) by mouth 3 (three) times daily as needed for cough.    Dispense:  60 capsule    Refill:  0    I personally performed the services described in this documentation, which was scribed in my presence. The recorded information has been reviewed and considered, and addended by me as needed.  Delman Cheadle, MD MPH

## 2014-11-12 ENCOUNTER — Encounter: Payer: Self-pay | Admitting: Internal Medicine

## 2014-11-12 ENCOUNTER — Telehealth: Payer: Self-pay

## 2014-11-12 ENCOUNTER — Other Ambulatory Visit: Payer: Self-pay

## 2014-11-12 DIAGNOSIS — Z1231 Encounter for screening mammogram for malignant neoplasm of breast: Secondary | ICD-10-CM

## 2014-11-12 NOTE — Telephone Encounter (Signed)
Updated Flu vaccine information.  Received Hep B titer information from Employee health, immunity is there.  Is there any other vaccines that she needs?  Thanks

## 2014-11-12 NOTE — Telephone Encounter (Signed)
No, if she has had the Hep A abd B series ., she is good

## 2014-11-16 ENCOUNTER — Ambulatory Visit (INDEPENDENT_AMBULATORY_CARE_PROVIDER_SITE_OTHER): Payer: 59 | Admitting: Physician Assistant

## 2014-11-16 VITALS — BP 122/80 | HR 75 | Temp 99.0°F | Resp 16 | Ht 64.0 in | Wt 200.0 lb

## 2014-11-16 DIAGNOSIS — H00013 Hordeolum externum right eye, unspecified eyelid: Secondary | ICD-10-CM | POA: Diagnosis not present

## 2014-11-16 MED ORDER — GENTAMICIN SULFATE 0.3 % OP OINT: TOPICAL_OINTMENT | Freq: Two times a day (BID) | OPHTHALMIC | Status: AC

## 2014-11-16 NOTE — Progress Notes (Signed)
Samantha Raymond  MRN: [id number] DOB: 11/25/1960  Subjective:  Pt presents to clinic with area under her right eye at the medial aspect of the lid that is swollen and tender and has been there about 4 days and seems to be getting worse. She has tried warm compresses but she is seeing no results.  She has had no trauma to the eye that she knows of.  Her eye globe is having no pain or problems and she is having no visual disturbance.  She has notice that her tears do not have normal tears but rather liquid that is more sticky.  She has recently been dealing with bronchitis and allergies but she typically does not have eye issues with her allergies and her right eye is completely fine.  Patient Active Problem List   Diagnosis Date Noted  . History of heat stroke 10/09/2014  . Family history of breast cancer in first degree relative 10/08/2014  . Fatty liver disease, nonalcoholic 20/25/4270  . 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 Unspecified vitamin D deficiency 08/24/2012  . Visit for preventive health examination 08/20/2012  . Menopause 08/17/2012  . Obesity (BMI 30-39.9) 08/09/2011  . Hypothyroidism 08/09/2011  . Recurrent urinary tract infection   . Neutropenia 08/07/2011    Current Outpatient Prescriptions on File Prior to Visit  Medication Sig Dispense Refill  . albuterol (PROVENTIL HFA;VENTOLIN HFA) 108 (90 BASE) MCG/ACT inhaler Inhale 2 puffs into the lungs every 6 (six) hours as needed for wheezing or shortness of breath. 1 Inhaler 11  . Calcium-Vitamin D-Vitamin K (VIACTIV PO) Take by mouth.    . Cholecalciferol (VITAMIN D3) 1000 UNITS CAPS Take 1 capsule by mouth daily.    Marland Kitchen escitalopram (LEXAPRO) 10 MG tablet Take 1 tablet (10 mg total) by mouth daily. 90 tablet 0  . Fluticasone-Salmeterol (ADVAIR DISKUS) 100-50 MCG/DOSE AEPB Inhale 1 puff into the lungs 2 (two) times daily. 180 each 0  . levothyroxine (SYNTHROID, LEVOTHROID) 25 MCG tablet TAKE 1 TABLET BY MOUTH ONCE DAILY  MONDAY THROUGH FRIDAY 90 tablet 1  . Multiple Vitamin (MULTIVITAMIN) tablet Take 1 tablet by mouth daily.    Marland Kitchen triamcinolone (NASACORT) 55 MCG/ACT AERO nasal inhaler Place 2 sprays into the nose daily.     No current facility-administered medications on file prior to visit.    Allergies  Allergen Reactions  . Sulfa Antibiotics Nausea And Vomiting    Review of Systems  Eyes: Positive for discharge. Negative for photophobia, pain, redness, itching and visual disturbance.   Objective:  BP 122/80 mmHg  Pulse 75  Temp(Src) 99 F (37.2 C) (Oral)  Resp 16  Ht 5\' 4"  (1.626 m)  Wt 200 lb (90.719 kg)  BMI 34.31 kg/m2  SpO2 97%  Physical Exam  Constitutional: She is oriented to person, place, and time and well-developed, well-nourished, and in no distress.  HENT:  Head: Normocephalic and atraumatic.  Right Ear: Hearing and external ear normal.  Left Ear: Hearing and external ear normal.  Eyes: Conjunctivae, EOM and lids are normal. Pupils are equal, round, and reactive to light. Right conjunctiva is not injected. Right conjunctiva has no hemorrhage. Left conjunctiva is not injected. Left conjunctiva has no hemorrhage.    Neck: Normal range of motion.  Pulmonary/Chest: Effort normal.  Neurological: She is alert and oriented to person, place, and time. Gait normal.  Skin: Skin is warm and dry.  Psychiatric: Mood, memory, affect and judgment normal.  Vitals reviewed.   Assessment and Plan :  Stye, right - Plan: gentamicin (GARAMYCIN) 0.3 % ophthalmic ointment   Due to the length of time and the fact that the patient has been doing warm compresses we will treat with abx ointment.  She will continue warm compresses and add no tear baby shampoo.  She will f/u if there is any change.  Windell Hummingbird PA-C  [facility] 11/16/2014 10:46 AM

## 2014-12-04 ENCOUNTER — Ambulatory Visit: Payer: 59

## 2014-12-25 ENCOUNTER — Ambulatory Visit
Admission: RE | Admit: 2014-12-25 | Discharge: 2014-12-25 | Disposition: A | Discharge status: Home / Self Care | Payer: 59 | Source: Ambulatory Visit

## 2014-12-25 DIAGNOSIS — Z1231 Encounter for screening mammogram for malignant neoplasm of breast: Secondary | ICD-10-CM

## 2015-01-01 ENCOUNTER — Telehealth: Payer: Self-pay | Admitting: Internal Medicine

## 2015-01-01 ENCOUNTER — Other Ambulatory Visit: Payer: Self-pay

## 2015-01-01 MED ORDER — ESCITALOPRAM OXALATE 10 MG PO TABS: 10.0000 mg | ORAL_TABLET | Freq: Every day | ORAL | Status: DC

## 2015-01-01 NOTE — Telephone Encounter (Signed)
Resent prescription and called the patient.

## 2015-01-01 NOTE — Telephone Encounter (Signed)
Pt called about her medication escitalopram (LEXAPRO) 10 MG tablet not at the pharmacy when she went there on Friday. Pharmacy [facility]. Thank you!

## 2015-02-05 ENCOUNTER — Ambulatory Visit (INDEPENDENT_AMBULATORY_CARE_PROVIDER_SITE_OTHER): Payer: 59 | Admitting: Internal Medicine

## 2015-02-05 VITALS — BP 122/74 | HR 75 | Temp 98.9°F | Resp 18 | Ht 64.0 in

## 2015-02-05 DIAGNOSIS — J0140 Acute pansinusitis, unspecified: Secondary | ICD-10-CM | POA: Diagnosis not present

## 2015-02-05 DIAGNOSIS — R05 Cough: Secondary | ICD-10-CM

## 2015-02-05 DIAGNOSIS — R059 Cough, unspecified: Secondary | ICD-10-CM

## 2015-02-05 MED ORDER — AMOXICILLIN 500 MG PO CAPS
1000.0000 mg | ORAL_CAPSULE | Freq: Two times a day (BID) | ORAL | Status: DC
Start: 2015-02-05 — End: 2015-02-25

## 2015-02-05 NOTE — Patient Instructions (Signed)

## 2015-02-05 NOTE — Progress Notes (Signed)
Patient ID: Samantha Raymond, female   DOB: 11-22-1960, 54 y.o.   MRN: [id number]   02/05/2015 at 11:31 AM  Samantha Raymond / DOB: March 21, 1960 / MRN: [id number]  Problem list reviewed and updated by me where necessary.   SUBJECTIVE  Samantha Raymond is a 54 y.o. ill appearing female presenting for the chief complaint of sinus infection, facial pain and congestion.. No sob or cp, she does have a cough.    She  has a past medical history of hypothyroid and Recurrent urinary tract infection.    Medications reviewed and updated by myself where necessary, and exist elsewhere in the encounter.   Samantha Raymond is allergic to sulfa antibiotics. She  reports that she has never smoked. She has never used smokeless tobacco. She reports that she drinks alcohol. She reports that she does not use illicit drugs. She  has no sexual activity history on file. The patient  has no past surgical history on file.  Her family history includes Arthritis in her sister; Asthma in her brother and mother; Cancer in her maternal aunt; Cancer (age of onset: 81) in her sister; Diabetes in her father; Heart disease in her father; Hyperlipidemia in her father; Stroke in her maternal aunt.  Review of Systems  Constitutional: Positive for malaise/fatigue. Negative for fever and chills.  HENT: Positive for congestion and ear pain. Negative for ear discharge and sore throat.   Respiratory: Positive for cough and sputum production. Negative for shortness of breath and wheezing.   Cardiovascular: Negative for chest pain.  Gastrointestinal: Negative for nausea.  Skin: Negative for rash.  Neurological: Positive for headaches. Negative for dizziness.    OBJECTIVE  Her  height is 5\' 4"  (1.626 m). Her oral temperature is 98.9 F (37.2 C). Her blood pressure is 122/74 and her pulse is 75. Her respiration is 18 and oxygen saturation is 99%.  The patient's body mass index is unknown because there is no weight on  file.  Physical Exam  Constitutional: She is oriented to person, place, and time. She appears well-developed and well-nourished.  HENT:  Head: Normocephalic.  Right Ear: External ear normal.  Left Ear: External ear normal.  Nose: Mucosal edema, rhinorrhea and sinus tenderness present. Right sinus exhibits maxillary sinus tenderness and frontal sinus tenderness. Left sinus exhibits no maxillary sinus tenderness and no frontal sinus tenderness.  Mouth/Throat: Oropharynx is clear and moist.  Eyes: Conjunctivae are normal. No scleral icterus.  Neck: Normal range of motion.  Cardiovascular: Normal rate.   Respiratory: Effort normal.  GI: She exhibits no distension.  Musculoskeletal: Normal range of motion.  Neurological: She is alert and oriented to person, place, and time. Coordination normal.  Skin: Skin is warm and dry.  Psychiatric: She has a normal mood and affect.    No results found for this or any previous visit (from the past 24 hour(s)).  ASSESSMENT & PLAN  Samantha Raymond was seen today for otalgia, congestion, cough and facial pain.  Diagnoses and all orders for this visit:  Acute pansinusitis, recurrence not specified -     amoxicillin (AMOXIL) 500 MG capsule; Take 2 capsules (1,000 mg total) by mouth 2 (two) times daily.  Cough -     amoxicillin (AMOXIL) 500 MG capsule; Take 2 capsules (1,000 mg total) by mouth 2 (two) times daily.

## 2015-02-25 ENCOUNTER — Ambulatory Visit (INDEPENDENT_AMBULATORY_CARE_PROVIDER_SITE_OTHER): Payer: 59 | Admitting: Family Medicine

## 2015-02-25 VITALS — BP 126/78 | HR 62 | Temp 98.0°F | Resp 16 | Ht 64.0 in | Wt 200.0 lb

## 2015-02-25 DIAGNOSIS — J4521 Mild intermittent asthma with (acute) exacerbation: Secondary | ICD-10-CM | POA: Diagnosis not present

## 2015-02-25 DIAGNOSIS — J209 Acute bronchitis, unspecified: Secondary | ICD-10-CM

## 2015-02-25 MED ORDER — METHYLPREDNISOLONE ACETATE 80 MG/ML IJ SUSP
80.0000 mg | Freq: Once | INTRAMUSCULAR | Status: AC
  Administered 2015-02-25: 80 mg via INTRAMUSCULAR

## 2015-02-25 MED ORDER — FLUTICASONE-SALMETEROL 100-50 MCG/DOSE IN AEPB: 1.0000 | INHALATION_SPRAY | Freq: Two times a day (BID) | RESPIRATORY_TRACT | Status: DC

## 2015-02-25 MED ORDER — AZITHROMYCIN 250 MG PO TABS: ORAL_TABLET | ORAL | Status: DC

## 2015-02-25 MED FILL — AZITHROMYCIN 250 MG TABLET: 250 | 5 days supply | Qty: 6 | Fill #0

## 2015-02-25 MED FILL — ADVAIR 100/50 DISKUS: 100-50 | 30 days supply | Qty: 60 | Fill #0

## 2015-02-25 NOTE — Progress Notes (Signed)
Subjective:  By signing my name below, I, Moises Blood, attest that this documentation has been prepared under the direction and in the presence of Delman Cheadle, MD. Electronically Signed: Moises Blood, [place]. 02/25/2015 , 4:45 PM .  Patient was seen in Room 8 .   Patient ID: Samantha Raymond, female    DOB: [birth year], 55 y.o.   MRN: [id number] Chief Complaint  Patient presents with  . Cough    x 3 days  . Nasal Congestion  . chest congestion   HPI Samantha Raymond is a 55 y.o. female who presents to [facility] complaining of cough that started 3 days ago with nasal and chest congestion. She was seen for sinusitis 3 weeks ago, treated with 10 day course of amoxicillin. She normally takes advair during winter for mild reactive airway along with nasacort nasal spray and as needed albuterol. She had similar symptoms during Christmas. Her husband was sick too, and she thought he caught what she had. So she wasn't careful and protective against his symptoms. Her symptoms became progressively worse.   Her symptoms became worse with coughs 3 days ago. She's been coughing a lot with voice loss and change. She used her last advair this morning and requested a refill. She had to use her albuterol inhaler a few times. She's also been using her nasacort nasal spray, mucinex, and tessalon perles. She denies waking up overnight with the symptoms. She denies fever and chills.   Past Medical History  Diagnosis Date  . hypothyroid   . Recurrent urinary tract infection    Prior to Admission medications   Medication Sig Start Date End Date Taking? Authorizing Provider  albuterol (PROVENTIL HFA;VENTOLIN HFA) 108 (90 BASE) MCG/ACT inhaler Inhale 2 puffs into the lungs every 6 (six) hours as needed for wheezing or shortness of breath. 09/06/13  Yes Crecencio Mc, MD  Calcium-Vitamin D-Vitamin K (VIACTIV PO) Take by mouth.   Yes Historical Provider, MD  Cholecalciferol (VITAMIN D3) 1000 UNITS CAPS Take 1 capsule  by mouth daily.   Yes Historical Provider, MD  escitalopram (LEXAPRO) 10 MG tablet Take 1 tablet (10 mg total) by mouth daily. 01/01/15  Yes Crecencio Mc, MD  Fluticasone-Salmeterol (ADVAIR DISKUS) 100-50 MCG/DOSE AEPB Inhale 1 puff into the lungs 2 (two) times daily. 09/06/13  Yes Crecencio Mc, MD  levothyroxine (SYNTHROID, LEVOTHROID) 25 MCG tablet TAKE 1 TABLET BY MOUTH ONCE DAILY MONDAY THROUGH FRIDAY 06/07/14  Yes Crecencio Mc, MD  Multiple Vitamin (MULTIVITAMIN) tablet Take 1 tablet by mouth daily.   Yes Historical Provider, MD  triamcinolone (NASACORT) 55 MCG/ACT AERO nasal inhaler Place 2 sprays into the nose daily.   Yes Historical Provider, MD   Allergies  Allergen Reactions  . Sulfa Antibiotics Nausea And Vomiting   Review of Systems  Constitutional: Negative for fever, chills, appetite change and fatigue.  HENT: Positive for congestion and voice change. Negative for rhinorrhea and sneezing.   Respiratory: Positive for cough and wheezing. Negative for shortness of breath.   Cardiovascular: Negative for chest pain.  Gastrointestinal: Negative for nausea and abdominal pain.       Objective:   Physical Exam  Constitutional: She is oriented to person, place, and time. She appears well-developed and well-nourished. No distress.  HENT:  Head: Normocephalic and atraumatic.  Right Ear: A middle ear effusion is present.  Left Ear: A middle ear effusion is present.  Mouth/Throat: Oropharynx is clear and moist.  Eyes: EOM are normal. Pupils  are equal, round, and reactive to light.  Neck: Neck supple.  Cardiovascular: Normal rate, regular rhythm and normal heart sounds.   No murmur heard. Pulmonary/Chest: Effort normal. No respiratory distress. She has decreased breath sounds (moderate). She has wheezes (left). She has rhonchi (expiratory).  Musculoskeletal: Normal range of motion.  Lymphadenopathy:       Head (right side): Submandibular adenopathy present.       Head (left  side): Submandibular adenopathy present.    She has cervical adenopathy.  Neurological: She is alert and oriented to person, place, and time.  Skin: Skin is warm and dry.  Psychiatric: She has a normal mood and affect. Her behavior is normal.  Nursing note and vitals reviewed.   BP 126/78 mmHg  Pulse 62  Temp(Src) 98 F (36.7 C) (Oral)  Resp 16  Ht 5\' 4"  (1.626 m)  Wt 200 lb (90.719 kg)  BMI 34.31 kg/m2  SpO2 97%     Assessment & Plan:   1. Acute bronchitis, unspecified organism   2. Airway hyperreactivity, mild intermittent, with acute exacerbation   If needs any cough medicine (tessalon, tussionex) ok to call in for this. Has some at home from prior. Has albuterol inhaler at home, refilled advair. If sxs persist, may need to add in oral pred taper.  Meds ordered this encounter  Medications  . methylPREDNISolone acetate (DEPO-MEDROL) injection 80 mg    Sig:   . Fluticasone-Salmeterol (ADVAIR DISKUS) 100-50 MCG/DOSE AEPB    Sig: Inhale 1 puff into the lungs 2 (two) times daily.    Dispense:  180 each    Refill:  0    90 day supply  . azithromycin (ZITHROMAX) 250 MG tablet    Sig: Take 2 tabs PO x 1 dose, then 1 tab PO QD x 4 days    Dispense:  6 tablet    Refill:  0    I personally performed the services described in this documentation, which was scribed in my presence. The recorded information has been reviewed and considered, and addended by me as needed.  Delman Cheadle, MD MPH

## 2015-02-25 NOTE — Patient Instructions (Addendum)
If in several days you are not turning the corner we could always follow with an oral prednisone taper. If you need a refill on any of your cough medications, let me know. Delsym, mucinex DM, tessalon pearles are going to be your best bet. Z-pack is at your pharmacy.  Bronchospasm, Adult A bronchospasm is a spasm or tightening of the airways going into the lungs. During a bronchospasm breathing becomes more difficult because the airways get smaller. When this happens there can be coughing, a whistling sound when breathing (wheezing), and difficulty breathing. Bronchospasm is often associated with asthma, but not all patients who experience a bronchospasm have asthma. CAUSES  A bronchospasm is caused by inflammation or irritation of the airways. The inflammation or irritation may be triggered by:   Allergies (such as to animals, pollen, food, or mold). Allergens that cause bronchospasm may cause wheezing immediately after exposure or many hours later.   Infection. Viral infections are believed to be the most common cause of bronchospasm.   Exercise.   Irritants (such as pollution, cigarette smoke, strong odors, aerosol sprays, and paint fumes).   Weather changes. Winds increase molds and pollens in the air. Rain refreshes the air by washing irritants out. Cold air may cause inflammation.   Stress and emotional upset.  SIGNS AND SYMPTOMS   Wheezing.   Excessive nighttime coughing.   Frequent or severe coughing with a simple cold.   Chest tightness.   Shortness of breath.  DIAGNOSIS  Bronchospasm is usually diagnosed through a history and physical exam. Tests, such as chest X-rays, are sometimes done to look for other conditions. TREATMENT   Inhaled medicines can be given to open up your airways and help you breathe. The medicines can be given using either an inhaler or a nebulizer machine.  Corticosteroid medicines may be given for severe bronchospasm, usually when it is  associated with asthma. HOME CARE INSTRUCTIONS   Always have a plan prepared for seeking medical care. Know when to call your health care provider and local emergency services (911 in the [place] Know where you can access local emergency care.  Only take medicines as directed by your health care provider.  If you were prescribed an inhaler or nebulizer machine, ask your health care provider to explain how to use it correctly. Always use a spacer with your inhaler if you were given one.  It is necessary to remain calm during an attack. Try to relax and breathe more slowly.  Control your home environment in the following ways:   Change your heating and air conditioning filter at least once a month.   Limit your use of fireplaces and wood stoves.  Do not smoke and do not allow smoking in your home.   Avoid exposure to perfumes and fragrances.   Get rid of pests (such as roaches and mice) and their droppings.   Throw away plants if you see mold on them.   Keep your house clean and dust free.   Replace carpet with wood, tile, or vinyl flooring. Carpet can trap dander and dust.   Use allergy-proof pillows, mattress covers, and box spring covers.   Wash bed sheets and blankets every week in hot water and dry them in a dryer.   Use blankets that are made of polyester or cotton.   Wash hands frequently. SEEK MEDICAL CARE IF:   You have muscle aches.   You have chest pain.   The sputum changes from clear or white to yellow,  green, gray, or bloody.   The sputum you cough up gets thicker.   There are problems that may be related to the medicine you are given, such as a rash, itching, swelling, or trouble breathing.  SEEK IMMEDIATE MEDICAL CARE IF:   You have worsening wheezing and coughing even after taking your prescribed medicines.   You have increased difficulty breathing.   You develop severe chest pain. MAKE SURE YOU:   Understand these  instructions.  Will watch your condition.  Will get help right away if you are not doing well or get worse.   This information is not intended to replace advice given to you by your health care provider. Make sure you discuss any questions you have with your health care provider.   Document Released: 02/05/2003 Document Revised: 02/23/2014 Document Reviewed: 07/25/2012 Elsevier Interactive Patient Education 2016 Else[employer] Inc[employer]  Acute Bronchitis Bronchitis is inflammation of the airways that extend from the windpipe into the lungs (bronchi). The inflammation often causes mucus to develop. This leads to a cough, which is the most common symptom of bronchitis.  In acute bronchitis, the condition usually develops suddenly and goes away over time, usually in a couple weeks. Smoking, allergies, and asthma can make bronchitis worse. Repeated episodes of bronchitis may cause further lung problems.  CAUSES Acute bronchitis is most often caused by the same virus that causes a cold. The virus can spread from person to person (contagious) through coughing, sneezing, and touching contaminated objects. SIGNS AND SYMPTOMS   Cough.   Fever.   Coughing up mucus.   Body aches.   Chest congestion.   Chills.   Shortness of breath.   Sore throat.  DIAGNOSIS  Acute bronchitis is usually diagnosed through a physical exam. Your health care provider will also ask you questions about your medical history. Tests, such as chest X-rays, are sometimes done to rule out other conditions.  TREATMENT  Acute bronchitis usually goes away in a couple weeks. Oftentimes, no medical treatment is necessary. Medicines are sometimes given for relief of fever or cough. Antibiotic medicines are usually not needed but may be prescribed in certain situations. In some cases, an inhaler may be recommended to help reduce shortness of breath and control the cough. A cool mist vaporizer may also be used to help thin  bronchial secretions and make it easier to clear the chest.  HOME CARE INSTRUCTIONS  Get plenty of rest.   Drink enough fluids to keep your urine clear or pale yellow (unless you have a medical condition that requires fluid restriction). Increasing fluids may help thin your respiratory secretions (sputum) and reduce chest congestion, and it will prevent dehydration.   Take medicines only as directed by your health care provider.  If you were prescribed an antibiotic medicine, finish it all even if you start to feel better.  Avoid smoking and secondhand smoke. Exposure to cigarette smoke or irritating chemicals will make bronchitis worse. If you are a smoker, consider using nicotine gum or skin patches to help control withdrawal symptoms. Quitting smoking will help your lungs heal faster.   Reduce the chances of another bout of acute bronchitis by washing your hands frequently, avoiding people with cold symptoms, and trying not to touch your hands to your mouth, nose, or eyes.   Keep all follow-up visits as directed by your health care provider.  SEEK MEDICAL CARE IF: Your symptoms do not improve after 1 week of treatment.  SEEK IMMEDIATE MEDICAL CARE IF:  You  develop an increased fever or chills.   You have chest pain.   You have severe shortness of breath.  You have bloody sputum.   You develop dehydration.  You faint or repeatedly feel like you are going to pass out.  You develop repeated vomiting.  You develop a severe headache. MAKE SURE YOU:   Understand these instructions.  Will watch your condition.  Will get help right away if you are not doing well or get worse.   This information is not intended to replace advice given to you by your health care provider. Make sure you discuss any questions you have with your health care provider.   Document Released: 03/12/2004 Document Revised: 02/23/2014 Document Reviewed: 07/26/2012 Elsevier Interactive Patient  Education [employer].

## 2015-03-20 ENCOUNTER — Encounter: Payer: Self-pay | Admitting: Internal Medicine

## 2015-03-20 NOTE — Telephone Encounter (Signed)
Please advise as to phentermine last ov 8/16.

## 2015-03-21 MED ORDER — PHENTERMINE HCL 37.5 MG PO TABS: ORAL_TABLET | ORAL | Status: DC

## 2015-03-26 MED FILL — PHENTERMINE 37.5 MG TABLET: 37.5 | 30 days supply | Qty: 30 | Fill #0

## 2015-03-27 NOTE — Telephone Encounter (Signed)
FYI

## 2015-04-05 ENCOUNTER — Ambulatory Visit (INDEPENDENT_AMBULATORY_CARE_PROVIDER_SITE_OTHER): Payer: 59 | Admitting: Family Medicine

## 2015-04-05 VITALS — BP 139/86 | HR 115 | Temp 98.4°F | Resp 18

## 2015-04-05 DIAGNOSIS — J4521 Mild intermittent asthma with (acute) exacerbation: Secondary | ICD-10-CM | POA: Diagnosis not present

## 2015-04-05 DIAGNOSIS — J209 Acute bronchitis, unspecified: Secondary | ICD-10-CM

## 2015-04-05 MED ORDER — METHYLPREDNISOLONE SODIUM SUCC 125 MG IJ SOLR
125.0000 mg | Freq: Once | INTRAMUSCULAR | Status: AC
  Administered 2015-04-05: 125 mg via INTRAVENOUS

## 2015-04-05 MED ORDER — AZITHROMYCIN 500 MG PO TABS: 500.0000 mg | ORAL_TABLET | Freq: Every day | ORAL | Status: DC

## 2015-04-05 MED ORDER — PREDNISONE 20 MG PO TABS: 40.0000 mg | ORAL_TABLET | Freq: Every day | ORAL | Status: DC

## 2015-04-05 MED ORDER — IPRATROPIUM BROMIDE 0.02 % IN SOLN
0.5000 mg | Freq: Once | RESPIRATORY_TRACT | Status: AC
Start: 2015-04-05 — End: 2015-04-05
  Administered 2015-04-05: 0.5 mg via RESPIRATORY_TRACT

## 2015-04-05 MED ORDER — ALBUTEROL SULFATE (2.5 MG/3ML) 0.083% IN NEBU
2.5000 mg | INHALATION_SOLUTION | Freq: Once | RESPIRATORY_TRACT | Status: AC
  Administered 2015-04-05: 2.5 mg via RESPIRATORY_TRACT

## 2015-04-05 MED FILL — predniSONE 20 MG TABS: 20 | 5 days supply | Qty: 10 | Fill #0

## 2015-04-05 MED FILL — AZITHROMYCIN 500 MG TABLET: 500 | 3 days supply | Qty: 3 | Fill #0

## 2015-04-05 NOTE — Progress Notes (Addendum)
Subjective:  By signing my name below, I, Raven Small, attest that this documentation has been prepared under the direction and in the presence of Delman Cheadle, MD.  Electronically Signed: Thea Alken, ED Scribe. 04/05/2015. 8:18 AM.   Patient ID: Samantha Raymond, female    DOB: 04/04/1960, 55 y.o.   MRN: [id number]  HPI Chief Complaint  Patient presents with  . Cough    since Monday  . Wheezing  . Shortness of Breath    HPI Comments: Samantha Raymond is a 55 y.o. Female with hx of asthma who presents to the [facility] complaining of worsening cough and SOB. Pt developed cough 5 days that worsened this morning along with SOB and wheezing. She reports SOB with walking short distance. She states she had gotten better from last visit with 1/09.  She has been using advair and albuterol without relief to symptoms. She reports decreased appetite but has been trying to drink fluids and eat solid foods.  She denies fever and muscle aches. .   Patient Active Problem List   Diagnosis Date Noted  . History of heat stroke 10/09/2014  . Family history of breast cancer in first degree relative 10/08/2014  . Fatty liver disease, nonalcoholic [id number]  . 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 Unspecified vitamin D deficiency 08/24/2012  . Visit for preventive health examination 08/20/2012  . Menopause 08/17/2012  . Obesity (BMI 30-39.9) 08/09/2011  . Hypothyroidism 08/09/2011  . Recurrent urinary tract infection   . Neutropenia ([place]) 08/07/2011   Past Medical History  Diagnosis Date  . hypothyroid   . Recurrent urinary tract infection    No past surgical history on file. Allergies  Allergen Reactions  . Sulfa Antibiotics Nausea And Vomiting   Prior to Admission medications   Medication Sig Start Date End Date Taking? Authorizing Provider  albuterol (PROVENTIL HFA;VENTOLIN HFA) 108 (90 BASE) MCG/ACT inhaler Inhale 2 puffs into the lungs every 6 (six) hours as needed  for wheezing or shortness of breath. 09/06/13  Yes Crecencio Mc, MD  Calcium-Vitamin D-Vitamin K (VIACTIV PO) Take by mouth.   Yes Historical Provider, MD  Cholecalciferol (VITAMIN D3) 1000 UNITS CAPS Take 1 capsule by mouth daily.   Yes Historical Provider, MD  escitalopram (LEXAPRO) 10 MG tablet Take 1 tablet (10 mg total) by mouth daily. 01/01/15  Yes Crecencio Mc, MD  Fluticasone-Salmeterol (ADVAIR DISKUS) 100-50 MCG/DOSE AEPB Inhale 1 puff into the lungs 2 (two) times daily. 02/25/15  Yes Shawnee Knapp, MD  levothyroxine (SYNTHROID, LEVOTHROID) 25 MCG tablet TAKE 1 TABLET BY MOUTH ONCE DAILY MONDAY THROUGH FRIDAY 06/07/14  Yes Crecencio Mc, MD  Multiple Vitamin (MULTIVITAMIN) tablet Take 1 tablet by mouth daily.   Yes Historical Provider, MD  phentermine (ADIPEX-P) 37.5 MG tablet 1/2 tablet in the am and early afternoon 03/21/15  Yes Crecencio Mc, MD  triamcinolone (NASACORT) 55 MCG/ACT AERO nasal inhaler Place 2 sprays into the nose daily.   Yes Historical Provider, MD   Social History   Social History  . Marital Status: Married    Spouse Name: N/A  . Number of Children: N/A  . Years of Education: N/A   Occupational History  . Not on file.   Social History Main Topics  . Smoking status: Never Smoker   . Smokeless tobacco: Never Used  . Alcohol Use: 0.0 oz/week    0 Standard drinks or equivalent per week  . Drug Use: No  .  Sexual Activity: Not on file   Other Topics Concern  . Not on file   Social History Narrative   Review of Systems  Constitutional: Positive for diaphoresis, activity change, appetite change and fatigue. Negative for fever, chills and unexpected weight change.  HENT: Positive for congestion, postnasal drip, rhinorrhea, sneezing, sore throat and voice change. Negative for sinus pressure and trouble swallowing.   Respiratory: Positive for cough, chest tightness, shortness of breath and wheezing. Negative for stridor.   Musculoskeletal: Negative for myalgias.    Allergic/Immunologic: Positive for environmental allergies.  Hematological: Negative for adenopathy.  Psychiatric/Behavioral: Positive for sleep disturbance.   Objective:   Physical Exam  Constitutional: She is oriented to person, place, and time. She appears well-developed and well-nourished. No distress.  HENT:  Head: Normocephalic and atraumatic.  Right Ear: A middle ear effusion is present.  Eyes: Conjunctivae and EOM are normal.  Neck: Neck supple.  Cardiovascular: Tachycardia present.   Murmur ( physiological left upper sternal border ) heard. Pulmonary/Chest: Effort normal. She has wheezes ( expiratory wheezing).  Decreased breath sounds throughout   Musculoskeletal: Normal range of motion.  Neurological: She is alert and oriented to person, place, and time.  Skin: Skin is warm and dry.  Psychiatric: She has a normal mood and affect. Her behavior is normal.  Nursing note and vitals reviewed.  On recheck exam- much improvement with breathing, expiratory wheeze and rhonchi at bases.   Filed Vitals:   04/05/15 0759  BP: 139/86  Pulse: 115  Temp: 98.4 F (36.9 C)  Resp: 18  SpO2: 99%    Assessment & Plan:   1. Asthma with acute exacerbation, mild intermittent   2. Acute bronchitis, unspecified organism   IV Solumedrol 125mg  given - pt had significant improvement in sxs and exam improvement within 20 min  Meds ordered this encounter  Medications  . albuterol (PROVENTIL) (2.5 MG/3ML) 0.083% nebulizer solution 2.5 mg    Sig:   . ipratropium (ATROVENT) nebulizer solution 0.5 mg    Sig:   . methylPREDNISolone sodium succinate (SOLU-MEDROL) 125 mg/2 mL injection 125 mg    Sig:   . azithromycin (ZITHROMAX) 500 MG tablet    Sig: Take 1 tablet (500 mg total) by mouth daily.    Dispense:  3 tablet    Refill:  0  . predniSONE (DELTASONE) 20 MG tablet    Sig: Take 2 tablets (40 mg total) by mouth daily with breakfast.    Dispense:  10 tablet    Refill:  0    I  personally performed the services described in this documentation, which was scribed in my presence. The recorded information has been reviewed and considered, and addended by me as needed.  Delman Cheadle, MD MPH

## 2015-04-05 NOTE — Patient Instructions (Signed)
Continue the tessalon pearles at night - let me know if you need any refills on cough medicines - (feel free to text [phone] if over the weekend). I would recommend covering with the azithromycin and prednisone over the weekend. You can stop the prednisone as soon as your asthma flair is going away - you do not need to complete the additional 5d course as long as you are feeling better.  I would recommend covering with the azithro due to immunosuppression with the prednisone, numerous sick contacts, and sweats with the prednisone make it difficult to assess for infectious sxs.  If you are getting worse today - YOU NEED TO GO HOME!  DOCTORS ORDERS.  Let me listen to your lungs and recheck this afternoon as the solumedrol is wearing off.

## 2015-04-08 ENCOUNTER — Telehealth: Payer: Self-pay | Admitting: Family Medicine

## 2015-04-08 ENCOUNTER — Ambulatory Visit (INDEPENDENT_AMBULATORY_CARE_PROVIDER_SITE_OTHER): Payer: 59 | Admitting: Family Medicine

## 2015-04-08 ENCOUNTER — Ambulatory Visit (INDEPENDENT_AMBULATORY_CARE_PROVIDER_SITE_OTHER): Payer: 59

## 2015-04-08 VITALS — BP 126/78 | HR 64 | Temp 97.6°F | Resp 24 | Ht 64.0 in | Wt 198.6 lb

## 2015-04-08 DIAGNOSIS — J45901 Unspecified asthma with (acute) exacerbation: Secondary | ICD-10-CM

## 2015-04-08 MED ORDER — METHYLPREDNISOLONE SODIUM SUCC 125 MG IJ SOLR
125.0000 mg | Freq: Once | INTRAMUSCULAR | Status: AC
  Administered 2015-04-08: 125 mg via INTRAMUSCULAR

## 2015-04-08 MED ORDER — ALBUTEROL SULFATE (2.5 MG/3ML) 0.083% IN NEBU
2.5000 mg | INHALATION_SOLUTION | Freq: Once | RESPIRATORY_TRACT | Status: AC
  Administered 2015-04-08: 2.5 mg via RESPIRATORY_TRACT

## 2015-04-08 MED ORDER — METHYLPREDNISOLONE SODIUM SUCC 125 MG IJ SOLR
62.5000 mg | Freq: Once | INTRAMUSCULAR | Status: AC
  Administered 2015-04-08: 62.5 mg via INTRAVENOUS

## 2015-04-08 MED ORDER — ALBUTEROL SULFATE (2.5 MG/3ML) 0.083% IN NEBU: 2.5000 mg | INHALATION_SOLUTION | Freq: Four times a day (QID) | RESPIRATORY_TRACT | Status: DC | PRN

## 2015-04-08 MED ORDER — FLUTICASONE-SALMETEROL 500-50 MCG/DOSE IN AEPB: 1.0000 | INHALATION_SPRAY | Freq: Two times a day (BID) | RESPIRATORY_TRACT | Status: DC

## 2015-04-08 MED ORDER — MONTELUKAST SODIUM 10 MG PO TABS: 10.0000 mg | ORAL_TABLET | Freq: Every day | ORAL | Status: DC

## 2015-04-08 MED ORDER — IPRATROPIUM BROMIDE 0.02 % IN SOLN
0.5000 mg | Freq: Once | RESPIRATORY_TRACT | Status: AC
  Administered 2015-04-08: 0.5 mg via RESPIRATORY_TRACT

## 2015-04-08 MED ORDER — PREDNISONE 20 MG PO TABS: ORAL_TABLET | ORAL | Status: DC

## 2015-04-08 MED FILL — MONTELUKAST SOD 10 MG TAB: 10 | 30 days supply | Qty: 30 | Fill #0

## 2015-04-08 MED FILL — predniSONE 20 MG TABS: 20 | 6 days supply | Qty: 12 | Fill #0

## 2015-04-08 MED FILL — ALBUTEROL 0.083 MG/ML SOLN: (2.5 MG/3ML | 15 days supply | Qty: 180 | Fill #0

## 2015-04-08 MED FILL — ADVAIR 500/50 DISKUS: 500-50 | 30 days supply | Qty: 60 | Fill #0

## 2015-04-08 NOTE — Addendum Note (Signed)
Addended by: Delman Cheadle on: 04/08/2015 08:37 AM   Modules accepted: Orders, Medications

## 2015-04-08 NOTE — Patient Instructions (Signed)
Because you received an x-ray today, you will receive an invoice from [facility]. Please contact [facility] at [phone] with questions or concerns regarding your invoice. Our billing staff will not be able to assist you with those questions. °

## 2015-04-09 DIAGNOSIS — J45909 Unspecified asthma, uncomplicated: Secondary | ICD-10-CM | POA: Diagnosis not present

## 2015-04-13 NOTE — Progress Notes (Signed)
Subjective:    Patient ID: Samantha Raymond, female    DOB: 1960/10/08, 55 y.o.   MRN: [id number] Chief Complaint  Patient presents with  . Cough    HPI  Was improving over the weekend so played golf yest. Then last night developed more SHoB. Used alb inh but thinks it is expired - was the first time she had had to use in years.  Poor sleep due to chest tightness and now having SHoB at rest.  No f/c, some nasal cong and non-prod cough.  + fatigue.  On prednisone. Pt finished 4d pred taper today and has increased advair to 500/50 as well as starting singulair.  Past Medical History  Diagnosis Date  . hypothyroid   . Recurrent urinary tract infection    Current Outpatient Prescriptions on File Prior to Visit  Medication Sig Dispense Refill  . albuterol (PROVENTIL HFA;VENTOLIN HFA) 108 (90 BASE) MCG/ACT inhaler Inhale 2 puffs into the lungs every 6 (six) hours as needed for wheezing or shortness of breath. 1 Inhaler 11  . Calcium-Vitamin D-Vitamin K (VIACTIV PO) Take by mouth.    . Cholecalciferol (VITAMIN D3) 1000 UNITS CAPS Take 1 capsule by mouth daily.    Marland Kitchen escitalopram (LEXAPRO) 10 MG tablet Take 1 tablet (10 mg total) by mouth daily. 90 tablet 1  . Fluticasone-Salmeterol (ADVAIR DISKUS) 500-50 MCG/DOSE AEPB Inhale 1 puff into the lungs 2 (two) times daily. 60 each 1  . levothyroxine (SYNTHROID, LEVOTHROID) 25 MCG tablet TAKE 1 TABLET BY MOUTH ONCE DAILY MONDAY THROUGH FRIDAY 90 tablet 1  . Multiple Vitamin (MULTIVITAMIN) tablet Take 1 tablet by mouth daily.    . phentermine (ADIPEX-P) 37.5 MG tablet 1/2 tablet in the am and early afternoon 30 tablet 2  . triamcinolone (NASACORT) 55 MCG/ACT AERO nasal inhaler Place 2 sprays into the nose daily.    . predniSONE (DELTASONE) 20 MG tablet Take 4 tabs po x1 2/21, 3 tabs po x1 2/22, 2 tabs po qd x 1d, 1 tab po qd x 3d, then stop (Patient not taking: Reported on 04/08/2015) 12 tablet 0   No current facility-administered medications on file  prior to visit.   Allergies  Allergen Reactions  . Sulfa Antibiotics Nausea And Vomiting     Review of Systems  Constitutional: Positive for fatigue. Negative for fever, chills, diaphoresis, activity change, appetite change and unexpected weight change.  HENT: Positive for postnasal drip, rhinorrhea, trouble swallowing and voice change. Negative for congestion and sinus pressure.   Respiratory: Positive for cough, chest tightness and shortness of breath. Negative for apnea, choking, wheezing and stridor.   Cardiovascular: Negative for chest pain and palpitations.  Gastrointestinal: Negative for abdominal pain.  Musculoskeletal: Negative for myalgias.  Skin: Negative for rash.  Neurological: Negative for tremors.  Hematological: Negative for adenopathy.  Psychiatric/Behavioral: Positive for sleep disturbance.       Objective:  BP 126/78 mmHg  Pulse 64  Temp(Src) 97.6 F (36.4 C) (Oral)  Resp 24  Ht 5\' 4"  (1.626 m)  Wt 198 lb 9.6 oz (90.084 kg)  BMI 34.07 kg/m2  SpO2 99%  Physical Exam  Constitutional: She is oriented to person, place, and time. She appears well-developed and well-nourished. No distress.  HENT:  Head: Normocephalic and atraumatic.  Right Ear: External ear normal.  Left Ear: External ear normal.  Nose: Nose normal.  Eyes: Conjunctivae are normal. Right eye exhibits no discharge. Left eye exhibits no discharge. No scleral icterus.  Neck: Neck supple. No  thyromegaly present.  Cardiovascular: Normal rate, regular rhythm and normal heart sounds.   Pulmonary/Chest: Accessory muscle usage present. No respiratory distress. She has decreased breath sounds. She has wheezes in the right upper field, the right lower field, the left upper field and the left lower field. She has rales in the right lower field and the left lower field.  Talks in complete sentences and walks through clinic w/ mild dyspnea.  Musculoskeletal: She exhibits no edema or tenderness.    Lymphadenopathy:    She has no cervical adenopathy.  Neurological: She is alert and oriented to person, place, and time.  Skin: Skin is warm and dry. She is not diaphoretic. No erythema.  Psychiatric: She has a normal mood and affect. Her behavior is normal.          Assessment & Plan:   1. Asthma with acute exacerbation, unspecified asthma severity     Orders Placed This Encounter  Procedures  . DME Nebulizer machine  . DG Chest 2 View    Standing Status: Future     Number of Occurrences: 1     Standing Expiration Date: 04/07/2016    Order Specific Question:  Reason for Exam (SYMPTOM  OR DIAGNOSIS REQUIRED)    Answer:  exp wheezing/rhonchi - ill >10d wiht worsening asthma exacerbation    Order Specific Question:  Is the patient pregnant?    Answer:  No    Order Specific Question:  Preferred imaging location?    Answer:  External    Meds ordered this encounter  Medications  . albuterol (PROVENTIL) (2.5 MG/3ML) 0.083% nebulizer solution 2.5 mg    Sig:   . ipratropium (ATROVENT) nebulizer solution 0.5 mg    Sig:   . montelukast (SINGULAIR) 10 MG tablet    Sig: Take 1 tablet (10 mg total) by mouth at bedtime.    Dispense:  30 tablet    Refill:  3  . methylPREDNISolone sodium succinate (SOLU-MEDROL) 125 mg/2 mL injection 62.5 mg    Sig:   . albuterol (PROVENTIL) (2.5 MG/3ML) 0.083% nebulizer solution 2.5 mg    Sig:   . albuterol (PROVENTIL) (2.5 MG/3ML) 0.083% nebulizer solution    Sig: Take 3 mLs (2.5 mg total) by nebulization every 6 (six) hours as needed for wheezing or shortness of breath.    Dispense:  150 mL    Refill:  1    Delman Cheadle, MD MPH

## 2015-04-21 ENCOUNTER — Telehealth: Payer: Self-pay | Admitting: Family Medicine

## 2015-04-21 MED ORDER — DOXYCYCLINE HYCLATE 100 MG PO CAPS: 100.0000 mg | ORAL_CAPSULE | Freq: Two times a day (BID) | ORAL | Status: DC

## 2015-04-21 MED ORDER — VALACYCLOVIR HCL 1 G PO TABS: 1000.0000 mg | ORAL_TABLET | Freq: Three times a day (TID) | ORAL | Status: DC

## 2015-04-21 MED ORDER — FLUCONAZOLE 150 MG PO TABS: 150.0000 mg | ORAL_TABLET | Freq: Once | ORAL | Status: DC

## 2015-04-22 MED FILL — valACYclovir HCL 1 GM TABS: 1 | 7 days supply | Qty: 21 | Fill #0

## 2015-04-22 MED FILL — DOXYCYCLINE HYCLATE 100 MG: 100 | 7 days supply | Qty: 14 | Fill #0

## 2015-04-22 MED FILL — FLUCONAZOLE 150 MG TABLET: 150 | 2 days supply | Qty: 2 | Fill #0

## 2015-05-01 MED FILL — ESCITALOPRAM 10 MG TABLET: 10 | 90 days supply | Qty: 90 | Fill #1

## 2015-05-14 ENCOUNTER — Encounter: Payer: Self-pay | Admitting: Family Medicine

## 2015-05-16 ENCOUNTER — Other Ambulatory Visit: Payer: Self-pay | Admitting: Internal Medicine

## 2015-05-16 MED FILL — LEVOTHYROXINE 25 MCG TABLET: 25 | 84 days supply | Qty: 60 | Fill #0

## 2015-05-16 MED FILL — PHENTERMINE 37.5 MG TABLET: 37.5 | 30 days supply | Qty: 30 | Fill #1

## 2015-05-22 ENCOUNTER — Other Ambulatory Visit: Payer: Self-pay | Admitting: Family Medicine

## 2015-05-22 MED ORDER — FLUTICASONE-SALMETEROL 500-50 MCG/DOSE IN AEPB: 1.0000 | INHALATION_SPRAY | Freq: Two times a day (BID) | RESPIRATORY_TRACT | Status: DC

## 2015-05-22 MED ORDER — MONTELUKAST SODIUM 10 MG PO TABS: 10.0000 mg | ORAL_TABLET | Freq: Every day | ORAL | Status: DC

## 2015-06-04 MED FILL — ADVAIR 500/50 DISKUS: 500-50 | 30 days supply | Qty: 60 | Fill #0

## 2015-06-04 MED FILL — MONTELUKAST SOD 10 MG TAB: 10 | 90 days supply | Qty: 90 | Fill #0

## 2015-06-10 ENCOUNTER — Encounter: Payer: Self-pay | Admitting: *Deleted

## 2015-06-12 ENCOUNTER — Ambulatory Visit (INDEPENDENT_AMBULATORY_CARE_PROVIDER_SITE_OTHER): Payer: 59 | Admitting: Internal Medicine

## 2015-06-12 ENCOUNTER — Encounter: Payer: Self-pay | Admitting: Internal Medicine

## 2015-06-12 VITALS — BP 108/68 | HR 85 | Temp 98.1°F | Resp 12 | Ht 64.0 in | Wt 191.2 lb

## 2015-06-12 DIAGNOSIS — E038 Other specified hypothyroidism: Secondary | ICD-10-CM | POA: Diagnosis not present

## 2015-06-12 DIAGNOSIS — E669 Obesity, unspecified: Secondary | ICD-10-CM

## 2015-06-12 DIAGNOSIS — J4521 Mild intermittent asthma with (acute) exacerbation: Secondary | ICD-10-CM | POA: Diagnosis not present

## 2015-06-12 DIAGNOSIS — K760 Fatty (change of) liver, not elsewhere classified: Secondary | ICD-10-CM | POA: Diagnosis not present

## 2015-06-12 DIAGNOSIS — E034 Atrophy of thyroid (acquired): Secondary | ICD-10-CM

## 2015-06-12 DIAGNOSIS — R748 Abnormal levels of other serum enzymes: Secondary | ICD-10-CM | POA: Diagnosis not present

## 2015-06-12 DIAGNOSIS — Z23 Encounter for immunization: Secondary | ICD-10-CM | POA: Diagnosis not present

## 2015-06-12 MED ORDER — PHENTERMINE HCL 37.5 MG PO TABS: ORAL_TABLET | ORAL | Status: DC

## 2015-06-12 NOTE — Progress Notes (Signed)
Pre-visit discussion using our clinic review tool. No additional management support is needed unless otherwise documented below in the visit note.  

## 2015-06-12 NOTE — Patient Instructions (Addendum)
Good job on the weight loss  !  I am refilling the phentermine for 3 more months.  See you in 4 !

## 2015-06-12 NOTE — Progress Notes (Signed)
Subjective:  Patient ID: Samantha Raymond, female    DOB: [birth year]  Age: 55 y.o. MRN: [id number]  CC: The primary encounter diagnosis was Elevated liver enzymes. Diagnoses of Airway hyperreactivity, mild intermittent, with acute exacerbation, Fatty liver disease, nonalcoholic, Hypothyroidism due to acquired atrophy of thyroid, and Obesity (BMI 30-39.9) were also pertinent to this visit.  HPI Samantha Raymond presents for follow up on multiple issues:  1) Obesity, with fatty liver.  She has addressed her weight gain with diet, exercise and aeppetite suppression .  She resumed phentermine in early February and has lost 21 lbs  So far.  She is tolerating the medication without side effects. Her goal is 145  Lbs.  She is adhering to a low carb high protein  Diet, and walking /swimming  several times per week.   .  Had a severe asthma exacerbation  In February which  Required multiple trips to [employer].  The symptoms started 4 days prior to presentation with a non productive cough .  She feveloped sudden onset of chest tightness and shortness of breath and received IV Solumedrol,  And nebulized albuterol x 4 .  She has resumed twice daily use of  advair and Singulair once daily.  now.  Her recovery was complicated by a blistering  Herpes outbreak and thrush. She was treated with Acyclovir ,  Fluconazole , etc, All resolved.  She has not used her rescue inhaler in over a month.  Her last pulmonology evaluation was many years ago with Dr Raul Del. PFTS were done but not available for review. She is due for pneumovax   Had a panic attack recently ,  seeing EAP counsellor . Discussed triggers and identified work related stressors     Outpatient Prescriptions Prior to Visit  Medication Sig Dispense Refill  . albuterol (PROVENTIL HFA;VENTOLIN HFA) 108 (90 BASE) MCG/ACT inhaler Inhale 2 puffs into the lungs every 6 (six) hours as needed for wheezing or shortness of breath. 1 Inhaler 11  .  albuterol (PROVENTIL) (2.5 MG/3ML) 0.083% nebulizer solution Take 3 mLs (2.5 mg total) by nebulization every 6 (six) hours as needed for wheezing or shortness of breath. 150 mL 1  . Calcium-Vitamin D-Vitamin K (VIACTIV PO) Take by mouth.    . Cholecalciferol (VITAMIN D3) 1000 UNITS CAPS Take 1 capsule by mouth daily.    Marland Kitchen escitalopram (LEXAPRO) 10 MG tablet Take 1 tablet (10 mg total) by mouth daily. 90 tablet 1  . Fluticasone-Salmeterol (ADVAIR DISKUS) 500-50 MCG/DOSE AEPB Inhale 1 puff into the lungs 2 (two) times daily. 60 each 2  . levothyroxine (SYNTHROID, LEVOTHROID) 25 MCG tablet TAKE 1 TABLET BY MOUTH ONCE DAILY MONDAY THROUGH FRIDAY 90 tablet 0  . montelukast (SINGULAIR) 10 MG tablet Take 1 tablet (10 mg total) by mouth at bedtime. 90 tablet 3  . Multiple Vitamin (MULTIVITAMIN) tablet Take 1 tablet by mouth daily.    Marland Kitchen triamcinolone (NASACORT) 55 MCG/ACT AERO nasal inhaler Place 2 sprays into the nose daily.    . phentermine (ADIPEX-P) 37.5 MG tablet 1/2 tablet in the am and early afternoon 30 tablet 2  . fluconazole (DIFLUCAN) 150 MG tablet Take 1 tablet (150 mg total) by mouth once. Repeat after antibiotic course is complete (Patient not taking: Reported on 06/12/2015) 2 tablet 0  . valACYclovir (VALTREX) 1000 MG tablet Take 1 tablet (1,000 mg total) by mouth 3 (three) times daily. (Patient not taking: Reported on 06/12/2015) 21 tablet 0  . doxycycline (VIBRAMYCIN) 100  MG capsule Take 1 capsule (100 mg total) by mouth 2 (two) times daily. 14 capsule 0   No facility-administered medications prior to visit.    Review of Systems;  Patient denies headache, fevers, malaise, unintentional weight loss, skin rash, eye pain, sinus congestion and sinus pain, sore throat, dysphagia,  hemoptysis , cough, dyspnea, wheezing, chest pain, palpitations, orthopnea, edema, abdominal pain, nausea, melena, diarrhea, constipation, flank pain, dysuria, hematuria, urinary  Frequency, nocturia, numbness,  tingling, seizures,  Focal weakness, Loss of consciousness,  Tremor, insomnia, depression, anxiety, and suicidal ideation.      Objective:  BP 108/68 mmHg  Pulse 85  Temp(Src) 98.1 F (36.7 C) (Oral)  Resp 12  Ht 5' 4"  (1.626 m)  Wt 191 lb 4 oz (86.75 kg)  BMI 32.81 kg/m2  SpO2 98%  BP Readings from Last 3 Encounters:  06/12/15 108/68  04/08/15 126/78  04/05/15 139/86    Wt Readings from Last 3 Encounters:  06/12/15 191 lb 4 oz (86.75 kg)  04/08/15 198 lb 9.6 oz (90.084 kg)  02/25/15 200 lb (90.719 kg)    General appearance: alert, cooperative and appears stated age Ears: normal TM's and external ear canals both ears Throat: lips, mucosa, and tongue normal; teeth and gums normal Neck: no adenopathy, no carotid bruit, supple, symmetrical, trachea midline and thyroid not enlarged, symmetric, no tenderness/mass/nodules Back: symmetric, no curvature. ROM normal. No CVA tenderness. Lungs: clear to auscultation bilaterally Heart: regular rate and rhythm, S1, S2 normal, no murmur, click, rub or gallop Abdomen: soft, non-tender; bowel sounds normal; no masses,  no organomegaly Pulses: 2+ and symmetric Skin: Skin color, texture, turgor normal. No rashes or lesions Lymph nodes: Cervical, supraclavicular, and axillary nodes normal.  No results found for: HGBA1C  Lab Results  Component Value Date   CREATININE 0.91 06/12/2015   CREATININE 0.78 10/08/2014   CREATININE 1.01 08/27/2014    Lab Results  Component Value Date   WBC 4.0 10/08/2014   HGB 14.4 10/08/2014   HCT 42.1 10/08/2014   PLT 240.0 10/08/2014   GLUCOSE 72 06/12/2015   CHOL 201* 10/08/2014   TRIG 84.0 10/08/2014   HDL 62.20 10/08/2014   LDLCALC 122* 10/08/2014   ALT 41* 06/12/2015   AST 32 06/12/2015   NA 138 06/12/2015   K 3.9 06/12/2015   CL 101 06/12/2015   CREATININE 0.91 06/12/2015   BUN 15 06/12/2015   CO2 28 06/12/2015   TSH 0.36 10/08/2014    Mm Screening Breast Tomo Bilateral  12/26/2014   CLINICAL DATA:  Screening. EXAM: DIGITAL SCREENING BILATERAL MAMMOGRAM WITH 3D TOMO WITH CAD COMPARISON:  Previous exam(s). ACR Breast Density Category b: There are scattered areas of fibroglandular density. FINDINGS: There are no findings suspicious for malignancy. Images were processed with CAD. IMPRESSION: No mammographic evidence of malignancy. A result letter of this screening mammogram will be mailed directly to the patient. RECOMMENDATION: Screening mammogram in one year. (Code:SM-B-01Y) BI-RADS CATEGORY  1: Negative. Electronically Signed   By: Ammie Ferrier M.D.   On: 12/26/2014 16:47    Assessment & Plan:   Problem List Items Addressed This Visit    Obesity (BMI 30-39.9)    I have congratulated her in reduction of   BMI and encouraged  Continued weight loss with goal of 10% of body weigh over the next 6 months using a low glycemic index diet and regular exercise a minimum of 5 days per week.  Phentermine refilled for 3 more months  Relevant Medications   phentermine (ADIPEX-P) 37.5 MG tablet   Hypothyroidism    Thyroid function was  normal and will need rechecking at next visit . No changes today   Lab Results  Component Value Date   TSH 0.36 10/08/2014           Relevant Orders   TSH   Fatty liver disease, nonalcoholic    Presumed by ultrasound changes and serologies negative for autoimmune causes of hepatitis.  Current liver enzymes are almost normal and all modifiable risk factors including obesity  and hyperlipidemia have been addressed   Lab Results  Component Value Date   ALT 41* 06/12/2015   AST 32 06/12/2015   ALKPHOS 66 06/12/2015   BILITOT 0.5 06/12/2015            Airway hyperreactivity    Recent asthma exacerbation requiring immediate attention and nebulized albuterol reviewed.  Recommended repeating her PFTS as these have not been done in over 5 years.  She has deferred for now        Other Visit Diagnoses    Elevated liver enzymes     -  Primary    Relevant Orders    Comp Met (CMET) (Completed)    Comprehensive metabolic panel     A total of 25 minutes of face to face time was spent with patient more than half of which was spent in counselling and coordination of care    I have discontinued Ms. Ruby's doxycycline. I am also having her maintain her Calcium-Vitamin D-Vitamin K (VIACTIV PO), multivitamin, Vitamin D3, albuterol, triamcinolone, escitalopram, albuterol, valACYclovir, fluconazole, levothyroxine, Fluticasone-Salmeterol, montelukast, and phentermine.  Meds ordered this encounter  Medications  . phentermine (ADIPEX-P) 37.5 MG tablet    Sig: 1/2 tablet in the am and early afternoon    Dispense:  30 tablet    Refill:  2    May refill on or after Jun 18 2015    Medications Discontinued During This Encounter  Medication Reason  . doxycycline (VIBRAMYCIN) 100 MG capsule Completed Course  . phentermine (ADIPEX-P) 37.5 MG tablet Reorder    Follow-up: Return in about 4 months (around 10/12/2015).   Crecencio Mc, MD

## 2015-06-13 ENCOUNTER — Encounter: Payer: Self-pay | Admitting: Internal Medicine

## 2015-06-13 LAB — COMPREHENSIVE METABOLIC PANEL
ALBUMIN: 4.5 g/dL (ref 3.5–5.2)
ALT: 41 U/L — ABNORMAL HIGH (ref 0–35)
AST: 32 U/L (ref 0–37)
Alkaline Phosphatase: 66 U/L (ref 39–117)
BUN: 15 mg/dL (ref 6–23)
CALCIUM: 9.8 mg/dL (ref 8.4–10.5)
CO2: 28 meq/L (ref 19–32)
CREATININE: 0.91 mg/dL (ref 0.40–1.20)
Chloride: 101 mEq/L (ref 96–112)
GFR: 67.91 mL/min (ref >60.00)
Glucose, Bld: 72 mg/dL (ref 70–99)
POTASSIUM: 3.9 meq/L (ref 3.5–5.1)
Sodium: 138 mEq/L (ref 135–145)
Total Bilirubin: 0.5 mg/dL (ref 0.2–1.2)
Total Protein: 7 g/dL (ref 6.0–8.3)

## 2015-06-14 ENCOUNTER — Ambulatory Visit: Payer: Self-pay | Admitting: Internal Medicine

## 2015-06-14 ENCOUNTER — Encounter: Payer: Self-pay | Admitting: Internal Medicine

## 2015-06-14 NOTE — Assessment & Plan Note (Addendum)
Recent asthma exacerbation requiring immediate attention and nebulized albuterol reviewed.  Recommended repeating her PFTS as these have not been done in over 5 years.  She has deferred for now

## 2015-06-14 NOTE — Assessment & Plan Note (Signed)
Presumed by ultrasound changes and serologies negative for autoimmune causes of hepatitis.  Current liver enzymes are almost normal and all modifiable risk factors including obesity  and hyperlipidemia have been addressed   Lab Results  Component Value Date   ALT 41* 06/12/2015   AST 32 06/12/2015   ALKPHOS 66 06/12/2015   BILITOT 0.5 06/12/2015

## 2015-06-14 NOTE — Assessment & Plan Note (Signed)
I have congratulated her in reduction of   BMI and encouraged  Continued weight loss with goal of 10% of body weigh over the next 6 months using a low glycemic index diet and regular exercise a minimum of 5 days per week.  Phentermine refilled for 3 more months

## 2015-06-14 NOTE — Assessment & Plan Note (Signed)
Thyroid function was  normal and will need rechecking at next visit . No changes today   Lab Results  Component Value Date   TSH 0.36 10/08/2014

## 2015-06-21 NOTE — Addendum Note (Signed)
Addended by: Nanci Pina on: 06/21/2015 05:10 PM   Modules accepted: Orders

## 2015-06-25 MED FILL — PHENTERMINE 37.5 MG TABLET: 37.5 | 30 days supply | Qty: 30 | Fill #2

## 2015-08-19 ENCOUNTER — Encounter: Payer: Self-pay | Admitting: Internal Medicine

## 2015-08-19 ENCOUNTER — Other Ambulatory Visit: Payer: Self-pay | Admitting: *Deleted

## 2015-08-19 ENCOUNTER — Other Ambulatory Visit: Payer: Self-pay | Admitting: Internal Medicine

## 2015-08-19 MED ORDER — ESCITALOPRAM OXALATE 10 MG PO TABS: 10.0000 mg | ORAL_TABLET | Freq: Every day | ORAL | Status: DC

## 2015-08-19 MED FILL — PHENTERMINE 37.5 MG TABLET: 37.5 | 30 days supply | Qty: 30 | Fill #0

## 2015-08-19 MED FILL — LEVOTHYROXINE 25 MCG TABLET: 25 | 41 days supply | Qty: 30 | Fill #1

## 2015-08-19 MED FILL — ESCITALOPRAM 10 MG TABLET: 10 | 90 days supply | Qty: 90 | Fill #0

## 2015-09-13 ENCOUNTER — Encounter: Payer: Self-pay | Admitting: Internal Medicine

## 2015-09-16 ENCOUNTER — Ambulatory Visit (INDEPENDENT_AMBULATORY_CARE_PROVIDER_SITE_OTHER): Payer: 59 | Admitting: Family Medicine

## 2015-09-16 DIAGNOSIS — E034 Atrophy of thyroid (acquired): Secondary | ICD-10-CM

## 2015-09-16 DIAGNOSIS — R748 Abnormal levels of other serum enzymes: Secondary | ICD-10-CM

## 2015-09-16 DIAGNOSIS — E038 Other specified hypothyroidism: Secondary | ICD-10-CM | POA: Diagnosis not present

## 2015-09-17 LAB — COMPREHENSIVE METABOLIC PANEL
ALT: 38 U/L — ABNORMAL HIGH (ref 6–29)
AST: 33 U/L (ref 10–35)
Albumin: 4.6 g/dL (ref 3.6–5.1)
Alkaline Phosphatase: 90 U/L (ref 33–130)
BUN: 10 mg/dL (ref 7–25)
CHLORIDE: 104 mmol/L (ref 98–110)
CO2: 27 mmol/L (ref 20–31)
CREATININE: 0.84 mg/dL (ref 0.50–1.05)
Calcium: 9.5 mg/dL (ref 8.6–10.4)
GLUCOSE: 86 mg/dL (ref 65–99)
POTASSIUM: 4.7 mmol/L (ref 3.5–5.3)
SODIUM: 138 mmol/L (ref 135–146)
Total Bilirubin: 0.5 mg/dL (ref 0.2–1.2)
Total Protein: 6.5 g/dL (ref 6.1–8.1)

## 2015-09-17 LAB — TSH: TSH: 0.46 m[IU]/L

## 2015-09-18 ENCOUNTER — Encounter: Payer: Self-pay | Admitting: Internal Medicine

## 2015-09-23 ENCOUNTER — Other Ambulatory Visit: Payer: Self-pay | Admitting: Internal Medicine

## 2015-09-23 MED FILL — LEVOTHYROXINE 25 MCG TABLET: 25 | 90 days supply | Qty: 90 | Fill #0

## 2015-09-23 MED FILL — PHENTERMINE 37.5 MG TABLET: 37.5 | 30 days supply | Qty: 30 | Fill #1

## 2015-10-09 ENCOUNTER — Encounter: Payer: Self-pay | Admitting: Internal Medicine

## 2015-10-09 ENCOUNTER — Ambulatory Visit (INDEPENDENT_AMBULATORY_CARE_PROVIDER_SITE_OTHER): Payer: 59 | Admitting: Internal Medicine

## 2015-10-09 VITALS — BP 104/70 | HR 76 | Temp 98.5°F | Resp 12 | Ht 64.0 in | Wt 172.5 lb

## 2015-10-09 DIAGNOSIS — E038 Other specified hypothyroidism: Secondary | ICD-10-CM | POA: Diagnosis not present

## 2015-10-09 DIAGNOSIS — E669 Obesity, unspecified: Secondary | ICD-10-CM

## 2015-10-09 DIAGNOSIS — D708 Other neutropenia: Secondary | ICD-10-CM

## 2015-10-09 DIAGNOSIS — Z1239 Encounter for other screening for malignant neoplasm of breast: Secondary | ICD-10-CM

## 2015-10-09 DIAGNOSIS — K760 Fatty (change of) liver, not elsewhere classified: Secondary | ICD-10-CM

## 2015-10-09 DIAGNOSIS — Z0000 Encounter for general adult medical examination without abnormal findings: Secondary | ICD-10-CM

## 2015-10-09 DIAGNOSIS — E034 Atrophy of thyroid (acquired): Secondary | ICD-10-CM

## 2015-10-09 LAB — COMPREHENSIVE METABOLIC PANEL
ALT: 26 U/L (ref 0–35)
AST: 26 U/L (ref 0–37)
Albumin: 4.6 g/dL (ref 3.5–5.2)
Alkaline Phosphatase: 84 U/L (ref 39–117)
BUN: 12 mg/dL (ref 6–23)
CHLORIDE: 104 meq/L (ref 96–112)
CO2: 29 meq/L (ref 19–32)
Calcium: 9.3 mg/dL (ref 8.4–10.5)
Creatinine, Ser: 0.83 mg/dL (ref 0.40–1.20)
GFR: 75.43 mL/min (ref >60.00)
GLUCOSE: 90 mg/dL (ref 70–99)
POTASSIUM: 3.9 meq/L (ref 3.5–5.1)
SODIUM: 138 meq/L (ref 135–145)
Total Bilirubin: 0.8 mg/dL (ref 0.2–1.2)
Total Protein: 6.9 g/dL (ref 6.0–8.3)

## 2015-10-09 MED ORDER — PHENTERMINE HCL 37.5 MG PO TABS: ORAL_TABLET | ORAL | 2 refills | Status: DC

## 2015-10-09 NOTE — Progress Notes (Signed)
Patient ID: Samantha Raymond, female    DOB: Jan 13, 1961  Age: 55 y.o. MRN: [id number]  The patient is here for annual CPE and management of other chronic and acute problems.   Colonoscopy on 2012 at age 31,  Normal.  Annual breast and pelvic exams done gyn. PAP smear normal 2015 Mammogram normal Nov 2016  Tdap 2016  Hep C screen 2016 neg  The risk factors are reflected in the social history.  The roster of all physicians providing medical care to patient - is listed in the Snapshot section of the chart.  Home safety : The patient has smoke detectors in the home. They wear seatbelts.  There are no firearms at home. There is no violence in the home.   There is no risks for hepatitis, STDs or HIV. There is no   history of blood transfusion. They have no travel history to infectious disease endemic areas of the world.  The patient has seen their dentist in the last six month. They have seen their eye doctor in the last year.    They do not  have excessive sun exposure. Discussed the need for sun protection: hats, long sleeves and use of sunscreen if there is significant sun exposure.   Diet: the importance of a healthy diet is discussed. They do have a healthy diet.  The benefits of regular aerobic exercise were discussed. She exercises 5 days per week   3o to 60 minutess.   Depression screen: there are no signs or vegative symptoms of depression- irritability, change in appetite, anhedonia, sadness/tearfullness.  The following portions of the patient's history were reviewed and updated as appropriate: allergies, current medications, past family history, past medical history,  past surgical history, past social history  and problem list.  Visual acuity was not assessed per patient preference since she has regular follow up with her ophthalmologist. Hearing and body mass index were assessed and reviewed.   During the course of the visit the patient was educated and counseled about  appropriate screening and preventive services including : fall prevention , diabetes screening, nutrition counseling, colorectal cancer screening, and recommended immunizations.    CC: The primary encounter diagnosis was Breast cancer screening. Diagnoses of Fatty liver, Hypothyroidism due to acquired atrophy of thyroid, Fatty liver disease, nonalcoholic, Other neutropenia ([place]), Obesity (BMI 30-39.9), and Visit for preventive health examination were also pertinent to this visit.  1) O obesity with recent initiation of phentermine for appetite suppression.  She is tolerating the medication without side effects .  She is taking the medication twice daily in a divided dose and  has lost a significant amount of weight in the last 3 months and in general is feeling better about herself. Weight loss thus far has been in excess of 5 % of initial body weight    History Samantha Raymond has a past medical history of hypothyroid and Recurrent urinary tract infection.   She has no past surgical history on file.   Her family history includes Arthritis in her sister; Asthma in her brother and mother; Cancer in her maternal aunt; Cancer (age of onset: 26) in her sister; Diabetes in her father; Heart disease in her father; Hyperlipidemia in her father; Stroke in her maternal aunt.She reports that she has never smoked. She has never used smokeless tobacco. She reports that she drinks alcohol. She reports that she does not use drugs.  Outpatient Medications Prior to Visit  Medication Sig Dispense Refill  . albuterol (PROVENTIL HFA;VENTOLIN HFA)  108 (90 BASE) MCG/ACT inhaler Inhale 2 puffs into the lungs every 6 (six) hours as needed for wheezing or shortness of breath. 1 Inhaler 11  . albuterol (PROVENTIL) (2.5 MG/3ML) 0.083% nebulizer solution Take 3 mLs (2.5 mg total) by nebulization every 6 (six) hours as needed for wheezing or shortness of breath. 150 mL 1  . Calcium-Vitamin D-Vitamin K (VIACTIV PO) Take by mouth.    .  Cholecalciferol (VITAMIN D3) 1000 UNITS CAPS Take 1 capsule by mouth daily.    Marland Kitchen escitalopram (LEXAPRO) 10 MG tablet Take 1 tablet (10 mg total) by mouth daily. 90 tablet 1  . Fluticasone-Salmeterol (ADVAIR DISKUS) 500-50 MCG/DOSE AEPB Inhale 1 puff into the lungs 2 (two) times daily. 60 each 2  . levothyroxine (SYNTHROID, LEVOTHROID) 25 MCG tablet Take 1 tablet (25 mcg total) by mouth daily before breakfast. 90 tablet 0  . montelukast (SINGULAIR) 10 MG tablet Take 1 tablet (10 mg total) by mouth at bedtime. 90 tablet 3  . Multiple Vitamin (MULTIVITAMIN) tablet Take 1 tablet by mouth daily.    Marland Kitchen triamcinolone (NASACORT) 55 MCG/ACT AERO nasal inhaler Place 2 sprays into the nose daily.    . phentermine (ADIPEX-P) 37.5 MG tablet 1/2 tablet in the am and early afternoon 30 tablet 2  . fluconazole (DIFLUCAN) 150 MG tablet Take 1 tablet (150 mg total) by mouth once. Repeat after antibiotic course is complete (Patient not taking: Reported on 06/12/2015) 2 tablet 0  . valACYclovir (VALTREX) 1000 MG tablet Take 1 tablet (1,000 mg total) by mouth 3 (three) times daily. (Patient not taking: Reported on 06/12/2015) 21 tablet 0   No facility-administered medications prior to visit.     Review of Systems   Patient denies headache, fevers, malaise, unintentional weight loss, skin rash, eye pain, sinus congestion and sinus pain, sore throat, dysphagia,  hemoptysis , cough, dyspnea, wheezing, chest pain, palpitations, orthopnea, edema, abdominal pain, nausea, melena, diarrhea, constipation, flank pain, dysuria, hematuria, urinary  Frequency, nocturia, numbness, tingling, seizures,  Focal weakness, Loss of consciousness,  Tremor, insomnia, depression, anxiety, and suicidal ideation.      Objective:  BP 104/70   Pulse 76   Temp 98.5 F (36.9 C) (Oral)   Resp 12   Ht 5\' 4"  (1.626 m)   Wt 172 lb 8 oz (78.2 kg)   SpO2 97%   BMI 29.61 kg/m   Physical Exam   General appearance: alert, cooperative and  appears stated age Ears: normal TM's and external ear canals both ears Throat: lips, mucosa, and tongue normal; teeth and gums normal Neck: no adenopathy, no carotid bruit, supple, symmetrical, trachea midline and thyroid not enlarged, symmetric, no tenderness/mass/nodules Back: symmetric, no curvature. ROM normal. No CVA tenderness. Lungs: clear to auscultation bilaterally Heart: regular rate and rhythm, S1, S2 normal, no murmur, click, rub or gallop Abdomen: soft, non-tender; bowel sounds normal; no masses,  no organomegaly Pulses: 2+ and symmetric Skin: Skin color, texture, turgor normal. No rashes or lesions Lymph nodes: Cervical, supraclavicular, and axillary nodes normal.    Assessment & Plan:   Problem List Items Addressed This Visit    Neutropenia (HCC)    Transient, asymptomatic. No further workup  Lab Results  Component Value Date   WBC 4.0 10/08/2014   HGB 14.4 10/08/2014   HCT 42.1 10/08/2014   MCV 89.9 10/08/2014   PLT 240.0 10/08/2014         Obesity (BMI 30-39.9)    She has lost 28 lbs since January,  19 in the last 3 months with use of phentermine. I have congratulated her in reduction of   BMI and encouraged continued weight loss with goal of  5% of body weight (8.5 lbs) over the next 3 months using a low glycemic index diet , phentermine for appetite suppression, and regular exercise a minimum of 5 days per week.        Relevant Medications   phentermine (ADIPEX-P) 37.5 MG tablet   Hypothyroidism    Thyroid function is WNL on current dose.  No current changes needed.   Lab Results  Component Value Date   TSH 0.46 09/16/2015         Visit for preventive health examination    Annual comprehensive preventive exam was done as well as an evaluation and management of chronic conditions .  During the course of the visit the patient was educated and counseled about appropriate screening and preventive services including :  diabetes screening, lipid analysis  with projected  10 year  risk for CAD , nutrition counseling, breast, cervical and colorectal cancer screening, and recommended immunizations.  Printed recommendations for health maintenance screenings was given      Fatty liver disease, nonalcoholic    Presumed by ultrasound changes and serologies negative for autoimmune causes of hepatitis.  Current liver enzymes have normalized and all modifiable risk factors including obesity  and hyperlipidemia have been addressed . Hepatitis A/B vaccine will be recommended.   Lab Results  Component Value Date   ALT 26 10/09/2015   AST 26 10/09/2015   ALKPHOS 84 10/09/2015   BILITOT 0.8 10/09/2015             Other Visit Diagnoses    Breast cancer screening    -  Primary   Relevant Orders   MM DIGITAL SCREENING BILATERAL   Comprehensive metabolic panel (Completed)   Lipid panel   Fatty liver       Relevant Orders   TSH      I am having Ms. Renicker maintain her Calcium-Vitamin D-Vitamin K (VIACTIV PO), multivitamin, Vitamin D3, albuterol, triamcinolone, albuterol, valACYclovir, fluconazole, Fluticasone-Salmeterol, montelukast, escitalopram, levothyroxine, and phentermine.  Meds ordered this encounter  Medications  . phentermine (ADIPEX-P) 37.5 MG tablet    Sig: 1/2 tablet in the am and early afternoon    Dispense:  30 tablet    Refill:  2    May refill on or after Jun 18 2015    Medications Discontinued During This Encounter  Medication Reason  . phentermine (ADIPEX-P) 37.5 MG tablet Reorder    Follow-up: No Follow-up on file.   Crecencio Mc, MD

## 2015-10-09 NOTE — Patient Instructions (Signed)
Congratulations!  I have refilled  The phentermine for 3 more months.  Let me know of you have any issues  Mammogram has been ordered,  Due in late July   See you in 3 months   Menopause is a normal process in which your reproductive ability comes to an end. This process happens gradually over a span of months to years, usually between the ages of 28 and 27. Menopause is complete when you have missed 12 consecutive menstrual periods. It is important to talk with your health care provider about some of the most common conditions that affect postmenopausal women, such as heart disease, cancer, and bone loss (osteoporosis). Adopting a healthy lifestyle and getting preventive care can help to promote your health and wellness. Those actions can also lower your chances of developing some of these common conditions. WHAT SHOULD I KNOW ABOUT MENOPAUSE? During menopause, you may experience a number of symptoms, such as:  Moderate-to-severe hot flashes.  Night sweats.  Decrease in sex drive.  Mood swings.  Headaches.  Tiredness.  Irritability.  Memory problems.  Insomnia. Choosing to treat or not to treat menopausal changes is an individual decision that you make with your health care provider. WHAT SHOULD I KNOW ABOUT HORMONE REPLACEMENT THERAPY AND SUPPLEMENTS? Hormone therapy products are effective for treating symptoms that are associated with menopause, such as hot flashes and night sweats. Hormone replacement carries certain risks, especially as you become older. If you are thinking about using estrogen or estrogen with progestin treatments, discuss the benefits and risks with your health care provider. WHAT SHOULD I KNOW ABOUT HEART DISEASE AND STROKE? Heart disease, heart attack, and stroke become more likely as you age. This may be due, in part, to the hormonal changes that your body experiences during menopause. These can affect how your body processes dietary fats, triglycerides,  and cholesterol. Heart attack and stroke are both medical emergencies. There are many things that you can do to help prevent heart disease and stroke:  Have your blood pressure checked at least every 1-2 years. High blood pressure causes heart disease and increases the risk of stroke.  If you are 25-33 years old, ask your health care provider if you should take aspirin to prevent a heart attack or a stroke.  Do not use any tobacco products, including cigarettes, chewing tobacco, or electronic cigarettes. If you need help quitting, ask your health care provider.  It is important to eat a healthy diet and maintain a healthy weight.  Be sure to include plenty of vegetables, fruits, low-fat dairy products, and lean protein.  Avoid eating foods that are high in solid fats, added sugars, or salt (sodium).  Get regular exercise. This is one of the most important things that you can do for your health.  Try to exercise for at least 150 minutes each week. The type of exercise that you do should increase your heart rate and make you sweat. This is known as moderate-intensity exercise.  Try to do strengthening exercises at least twice each week. Do these in addition to the moderate-intensity exercise.  Know your numbers.Ask your health care provider to check your cholesterol and your blood glucose. Continue to have your blood tested as directed by your health care provider. WHAT SHOULD I KNOW ABOUT CANCER SCREENING? There are several types of cancer. Take the following steps to reduce your risk and to catch any cancer development as early as possible. Breast Cancer  Practice breast self-awareness.  This means understanding  how your breasts normally appear and feel.  It also means doing regular breast self-exams. Let your health care provider know about any changes, no matter how small.  If you are 75 or older, have a clinician do a breast exam (clinical breast exam or CBE) every year. Depending  on your age, family history, and medical history, it may be recommended that you also have a yearly breast X-ray (mammogram).  If you have a family history of breast cancer, talk with your health care provider about genetic screening.  If you are at high risk for breast cancer, talk with your health care provider about having an MRI and a mammogram every year.  Breast cancer (BRCA) gene test is recommended for women who have family members with BRCA-related cancers. Results of the assessment will determine the need for genetic counseling and BRCA1 and for BRCA2 testing. BRCA-related cancers include these types:  Breast. This occurs in males or females.  Ovarian.  Tubal. This may also be called fallopian tube cancer.  Cancer of the abdominal or pelvic lining (peritoneal cancer).  Prostate.  Pancreatic. Cervical, Uterine, and Ovarian Cancer Your health care provider may recommend that you be screened regularly for cancer of the pelvic organs. These include your ovaries, uterus, and vagina. This screening involves a pelvic exam, which includes checking for microscopic changes to the surface of your cervix (Pap test).  For women ages 21-65, health care providers may recommend a pelvic exam and a Pap test every three years. For women ages 102-65, they may recommend the Pap test and pelvic exam, combined with testing for human papilloma virus (HPV), every five years. Some types of HPV increase your risk of cervical cancer. Testing for HPV may also be done on women of any age who have unclear Pap test results.  Other health care providers may not recommend any screening for nonpregnant women who are considered low risk for pelvic cancer and have no symptoms. Ask your health care provider if a screening pelvic exam is right for you.  If you have had past treatment for cervical cancer or a condition that could lead to cancer, you need Pap tests and screening for cancer for at least 20 years after  your treatment. If Pap tests have been discontinued for you, your risk factors (such as having a new sexual partner) need to be reassessed to determine if you should start having screenings again. Some women have medical problems that increase the chance of getting cervical cancer. In these cases, your health care provider may recommend that you have screening and Pap tests more often.  If you have a family history of uterine cancer or ovarian cancer, talk with your health care provider about genetic screening.  If you have vaginal bleeding after reaching menopause, tell your health care provider.  There are currently no reliable tests available to screen for ovarian cancer. Lung Cancer Lung cancer screening is recommended for adults 57-33 years old who are at high risk for lung cancer because of a history of smoking. A yearly low-dose CT scan of the lungs is recommended if you:  Currently smoke.  Have a history of at least 30 pack-years of smoking and you currently smoke or have quit within the past 15 years. A pack-year is smoking an average of one pack of cigarettes per day for one year. Yearly screening should:  Continue until it has been 15 years since you quit.  Stop if you develop a health problem that would prevent  you from having lung cancer treatment. Colorectal Cancer  This type of cancer can be detected and can often be prevented.  Routine colorectal cancer screening usually begins at age 58 and continues through age 53.  If you have risk factors for colon cancer, your health care provider may recommend that you be screened at an earlier age.  If you have a family history of colorectal cancer, talk with your health care provider about genetic screening.  Your health care provider may also recommend using home test kits to check for hidden blood in your stool.  A small camera at the end of a tube can be used to examine your colon directly (sigmoidoscopy or colonoscopy). This  is done to check for the earliest forms of colorectal cancer.  Direct examination of the colon should be repeated every 5-10 years until age 62. However, if early forms of precancerous polyps or small growths are found or if you have a family history or genetic risk for colorectal cancer, you may need to be screened more often. Skin Cancer  Check your skin from head to toe regularly.  Monitor any moles. Be sure to tell your health care provider:  About any new moles or changes in moles, especially if there is a change in a mole's shape or color.  If you have a mole that is larger than the size of a pencil eraser.  If any of your family members has a history of skin cancer, especially at a young age, talk with your health care provider about genetic screening.  Always use sunscreen. Apply sunscreen liberally and repeatedly throughout the day.  Whenever you are outside, protect yourself by wearing long sleeves, pants, a wide-brimmed hat, and sunglasses. WHAT SHOULD I KNOW ABOUT OSTEOPOROSIS? Osteoporosis is a condition in which bone destruction happens more quickly than new bone creation. After menopause, you may be at an increased risk for osteoporosis. To help prevent osteoporosis or the bone fractures that can happen because of osteoporosis, the following is recommended:  If you are 73-96 years old, get at least 1,000 mg of calcium and at least 600 mg of vitamin D per day.  If you are older than age 24 but younger than age 78, get at least 1,200 mg of calcium and at least 600 mg of vitamin D per day.  If you are older than age 65, get at least 1,200 mg of calcium and at least 800 mg of vitamin D per day. Smoking and excessive alcohol intake increase the risk of osteoporosis. Eat foods that are rich in calcium and vitamin D, and do weight-bearing exercises several times each week as directed by your health care provider. WHAT SHOULD I KNOW ABOUT HOW MENOPAUSE AFFECTS Samantha Raymond? Depression may occur at any age, but it is more common as you become older. Common symptoms of depression include:  Low or sad mood.  Changes in sleep patterns.  Changes in appetite or eating patterns.  Feeling an overall lack of motivation or enjoyment of activities that you previously enjoyed.  Frequent crying spells. Talk with your health care provider if you think that you are experiencing depression. WHAT SHOULD I KNOW ABOUT IMMUNIZATIONS? It is important that you get and maintain your immunizations. These include:  Tetanus, diphtheria, and pertussis (Tdap) booster vaccine.  Influenza every year before the flu season begins.  Pneumonia vaccine.  Shingles vaccine. Your health care provider may also recommend other immunizations.   This information is not intended to  replace advice given to you by your health care provider. Make sure you discuss any questions you have with your health care provider.   Document Released: 03/27/2005 Document Revised: 02/23/2014 Document Reviewed: 10/05/2013 Elsevier Interactive Patient Education [employer].

## 2015-10-09 NOTE — Progress Notes (Signed)
Pre-visit discussion using our clinic review tool. No additional management support is needed unless otherwise documented below in the visit note.  

## 2015-10-11 NOTE — Assessment & Plan Note (Signed)
Thyroid function is WNL on current dose.  No current changes needed.   Lab Results  Component Value Date   TSH 0.46 09/16/2015

## 2015-10-11 NOTE — Assessment & Plan Note (Signed)
Annual comprehensive preventive exam was done as well as an evaluation and management of chronic conditions .  During the course of the visit the patient was educated and counseled about appropriate screening and preventive services including :  diabetes screening, lipid analysis with projected  10 year  risk for CAD , nutrition counseling, breast, cervical and colorectal cancer screening, and recommended immunizations.  Printed recommendations for health maintenance screenings was given 

## 2015-10-11 NOTE — Assessment & Plan Note (Signed)
Presumed by ultrasound changes and serologies negative for autoimmune causes of hepatitis.  Current liver enzymes have normalized and all modifiable risk factors including obesity  and hyperlipidemia have been addressed . Hepatitis A/B vaccine will be recommended.   Lab Results  Component Value Date   ALT 26 10/09/2015   AST 26 10/09/2015   ALKPHOS 84 10/09/2015   BILITOT 0.8 10/09/2015

## 2015-10-11 NOTE — Assessment & Plan Note (Signed)
Transient, asymptomatic. No further workup  Lab Results  Component Value Date   WBC 4.0 10/08/2014   HGB 14.4 10/08/2014   HCT 42.1 10/08/2014   MCV 89.9 10/08/2014   PLT 240.0 10/08/2014

## 2015-10-11 NOTE — Progress Notes (Signed)
Lab visit only; no provider encounter. 

## 2015-10-11 NOTE — Assessment & Plan Note (Signed)
She has lost 28 lbs since January,  19 in the last 3 months with use of phentermine. I have congratulated her in reduction of   BMI and encouraged continued weight loss with goal of  5% of body weight (8.5 lbs) over the next 3 months using a low glycemic index diet , phentermine for appetite suppression, and regular exercise a minimum of 5 days per week.

## 2015-10-12 ENCOUNTER — Encounter: Payer: Self-pay | Admitting: Internal Medicine

## 2015-10-22 ENCOUNTER — Encounter: Payer: Self-pay | Admitting: Internal Medicine

## 2015-10-28 MED FILL — PHENTERMINE 37.5 MG TABLET: 37.5 | 30 days supply | Qty: 30 | Fill #2

## 2015-11-14 ENCOUNTER — Encounter: Payer: Self-pay | Admitting: Internal Medicine

## 2015-11-27 MED FILL — ADVAIR 500/50 DISKUS: 500-50 | 30 days supply | Qty: 60 | Fill #1

## 2015-11-27 MED FILL — ESCITALOPRAM 10 MG TABLET: 10 | 90 days supply | Qty: 90 | Fill #1

## 2015-12-03 MED FILL — PHENTERMINE 37.5 MG TABLET: 37.5 | 30 days supply | Qty: 30 | Fill #0

## 2016-01-01 MED FILL — PHENTERMINE 37.5 MG TABLET: 37.5 | 30 days supply | Qty: 30 | Fill #1

## 2016-01-24 ENCOUNTER — Ambulatory Visit (INDEPENDENT_AMBULATORY_CARE_PROVIDER_SITE_OTHER): Payer: 59 | Admitting: Family Medicine

## 2016-01-24 VITALS — BP 104/64 | HR 87 | Temp 98.6°F | Resp 16 | Ht 63.25 in | Wt 169.8 lb

## 2016-01-24 DIAGNOSIS — J069 Acute upper respiratory infection, unspecified: Secondary | ICD-10-CM | POA: Diagnosis not present

## 2016-01-24 DIAGNOSIS — J4521 Mild intermittent asthma with (acute) exacerbation: Secondary | ICD-10-CM | POA: Diagnosis not present

## 2016-01-24 MED ORDER — ALBUTEROL SULFATE HFA 108 (90 BASE) MCG/ACT IN AERS: 2.0000 | INHALATION_SPRAY | Freq: Four times a day (QID) | RESPIRATORY_TRACT | 3 refills | Status: DC | PRN

## 2016-01-24 MED ORDER — AZITHROMYCIN 250 MG PO TABS: ORAL_TABLET | ORAL | 0 refills | Status: DC

## 2016-01-24 MED ORDER — PREDNISONE 50 MG PO TABS: 50.0000 mg | ORAL_TABLET | Freq: Every day | ORAL | 0 refills | Status: DC

## 2016-01-24 MED FILL — predniSONE 50 MG TABS: 50 | 3 days supply | Qty: 3 | Fill #0

## 2016-01-24 MED FILL — VENTOLIN HFA 90 MCG INHALER: 108 (90 BAS | 25 days supply | Qty: 18 | Fill #0

## 2016-01-24 MED FILL — AZITHROMYCIN 250 MG TABLET: 250 | 5 days supply | Qty: 6 | Fill #0

## 2016-01-24 NOTE — Patient Instructions (Addendum)
IF you received an x-ray today, you will receive an invoice from [facility]. Please contact [facility] at [phone] with questions or concerns regarding your invoice.   IF you received labwork today, you will receive an invoice from [employer]. Please contact [facility] at [phone] with questions or concerns regarding your invoice.   Our billing staff will not be able to assist you with questions regarding bills from these companies.  You will be contacted with the lab results as soon as they are available. The fastest way to get your results is to activate your My Chart account. Instructions are located on the last page of this paperwork. If you have not heard from [place] regarding the results in 2 weeks, please contact this office.     Asthma, Acute Bronchospasm Acute bronchospasm caused by asthma is also referred to as an asthma attack. Bronchospasm means your air passages become narrowed. The narrowing is caused by inflammation and tightening of the muscles in the air tubes (bronchi) in your lungs. This can make it hard to breathe or cause you to wheeze and cough. What are the causes? Possible triggers are:  Animal dander from the skin, hair, or feathers of animals.  Dust mites contained in house dust.  Cockroaches.  Pollen from trees or grass.  Mold.  Cigarette or tobacco smoke.  Air pollutants such as dust, household cleaners, hair sprays, aerosol sprays, paint fumes, strong chemicals, or strong odors.  Cold air or weather changes. Cold air may trigger inflammation. Winds increase molds and pollens in the air.  Strong emotions such as crying or laughing hard.  Stress.  Certain medicines such as aspirin or beta-blockers.  Sulfites in foods and drinks, such as dried fruits and wine.  Infections or inflammatory conditions, such as a flu, cold, or inflammation of the nasal membranes (rhinitis).  Gastroesophageal reflux  disease (GERD). GERD is a condition where stomach acid backs up into your esophagus.  Exercise or strenuous activity. What are the signs or symptoms?  Wheezing.  Excessive coughing, particularly at night.  Chest tightness.  Shortness of breath. How is this diagnosed? Your health care provider will ask you about your medical history and perform a physical exam. A chest X-ray or blood testing may be performed to look for other causes of your symptoms or other conditions that may have triggered your asthma attack. How is this treated? Treatment is aimed at reducing inflammation and opening up the airways in your lungs. Most asthma attacks are treated with inhaled medicines. These include quick relief or rescue medicines (such as bronchodilators) and controller medicines (such as inhaled corticosteroids). These medicines are sometimes given through an inhaler or a nebulizer. Systemic steroid medicine taken by mouth or given through an IV tube also can be used to reduce the inflammation when an attack is moderate or severe. Antibiotic medicines are only used if a bacterial infection is present. Follow these instructions at home:  Rest.  Drink plenty of liquids. This helps the mucus to remain thin and be easily coughed up. Only use caffeine in moderation and do not use alcohol until you have recovered from your illness.  Do not smoke. Avoid being exposed to secondhand smoke.  You play a critical role in keeping yourself in good health. Avoid exposure to things that cause you to wheeze or to have breathing problems.  Keep your medicines up-to-date and available. Carefully follow your health care provider's treatment plan.  Take your medicine exactly  as prescribed.  When pollen or pollution is bad, keep windows closed and use an air conditioner or go to places with air conditioning.  Asthma requires careful medical care. See your health care provider for a follow-up as advised. If you are  more than [redacted] weeks pregnant and you were prescribed any new medicines, let your obstetrician know about the visit and how you are doing. Follow up with your health care provider as directed.  After you have recovered from your asthma attack, make an appointment with your outpatient doctor to talk about ways to reduce the likelihood of future attacks. If you do not have a doctor who manages your asthma, make an appointment with a primary care doctor to discuss your asthma. Get help right away if:  You are getting worse.  You have trouble breathing. If severe, call your local emergency services (911 in the [place]  You develop chest pain or discomfort.  You are vomiting.  You are not able to keep fluids down.  You are coughing up yellow, green, brown, or bloody sputum.  You have a fever and your symptoms suddenly get worse.  You have trouble swallowing. This information is not intended to replace advice given to you by your health care provider. Make sure you discuss any questions you have with your health care provider. Document Released: 05/20/2006 Document Revised: 07/17/2015 Document Reviewed: 08/10/2012 Elsevier Interactive Patient Education  2017 [employer].

## 2016-01-24 NOTE — Progress Notes (Signed)
Chief Complaint  Patient presents with  . Cough    started yesterday nonproductive  . Medication Refill    albuterol in [facility]    HPI   Acute URI Pt reports that she has been coughing since yesterday Her sick contact was her husband She reports that yesterday morning she coughed and felt like she had chest congestion She reports that last February she had asthmatic bronchitis a year ago No fevers or chills No posttussive emesis Has not tried any otc medications  Asthma She reports that her asthma  She states that she typically Advair, singulair and albuterol She reports that her advair has been well controlled She reports that she has not needed her albuterol and does not even have one right now. This has been her first issue in months She reports that she acutely developed nightime symptoms.  Her triggers are typically URIs    Past Medical History:  Diagnosis Date  . hypothyroid   . Recurrent urinary tract infection     Current Outpatient Prescriptions  Medication Sig Dispense Refill  . albuterol (PROVENTIL HFA;VENTOLIN HFA) 108 (90 Base) MCG/ACT inhaler Inhale 2 puffs into the lungs every 6 (six) hours as needed for wheezing or shortness of breath. 1 Inhaler 3  . albuterol (PROVENTIL) (2.5 MG/3ML) 0.083% nebulizer solution Take 3 mLs (2.5 mg total) by nebulization every 6 (six) hours as needed for wheezing or shortness of breath. 150 mL 1  . Calcium-Vitamin D-Vitamin K (VIACTIV PO) Take by mouth.    . Cholecalciferol (VITAMIN D3) 1000 UNITS CAPS Take 1 capsule by mouth daily.    Marland Kitchen escitalopram (LEXAPRO) 10 MG tablet Take 1 tablet (10 mg total) by mouth daily. 90 tablet 1  . fluconazole (DIFLUCAN) 150 MG tablet Take 1 tablet (150 mg total) by mouth once. Repeat after antibiotic course is complete 2 tablet 0  . Fluticasone-Salmeterol (ADVAIR DISKUS) 500-50 MCG/DOSE AEPB Inhale 1 puff into the lungs 2 (two) times daily. 60 each 2  . levothyroxine (SYNTHROID, LEVOTHROID)  25 MCG tablet Take 1 tablet (25 mcg total) by mouth daily before breakfast. 90 tablet 0  . montelukast (SINGULAIR) 10 MG tablet Take 1 tablet (10 mg total) by mouth at bedtime. 90 tablet 3  . Multiple Vitamin (MULTIVITAMIN) tablet Take 1 tablet by mouth daily.    . phentermine (ADIPEX-P) 37.5 MG tablet 1/2 tablet in the am and early afternoon 30 tablet 2  . triamcinolone (NASACORT) 55 MCG/ACT AERO nasal inhaler Place 2 sprays into the nose daily.    . valACYclovir (VALTREX) 1000 MG tablet Take 1 tablet (1,000 mg total) by mouth 3 (three) times daily. 21 tablet 0  . azithromycin (ZITHROMAX) 250 MG tablet Take 2 tablets on day 1, then 1 tablet each day after 6 tablet 0  . predniSONE (DELTASONE) 50 MG tablet Take 1 tablet (50 mg total) by mouth daily with breakfast. 3 tablet 0   No current facility-administered medications for this visit.     Allergies:  Allergies  Allergen Reactions  . Sulfa Antibiotics Nausea And Vomiting    No past surgical history on file.  Social History   Social History  . Marital status: Married    Spouse name: N/A  . Number of children: N/A  . Years of education: N/A   Social History Main Topics  . Smoking status: Never Smoker  . Smokeless tobacco: Never Used  . Alcohol use 0.0 oz/week  . Drug use: No  . Sexual activity: Not Asked  Other Topics Concern  . None   Social History Narrative  . None    ROS  Objective: Vitals:   01/24/16 1037  BP: 104/64  Pulse: 87  Resp: 16  Temp: 98.6 F (37 C)  TempSrc: Oral  SpO2: 99%  Weight: 169 lb 12.8 oz (77 kg)  Height: 5' 3.25" (1.607 m)    Physical Exam General: alert, oriented, in NAD Head: normocephalic, atraumatic, no sinus tenderness Eyes: EOM intact, no scleral icterus or conjunctival injection Ears: TM clear bilaterally Throat: no pharyngeal exudate or erythema Lymph: no posterior auricular, submental or cervical lymph adenopathy Heart: normal rate, normal sinus rhythm, no  murmurs Lungs: Right middle lobe wheeze on exhalation resolved after coughing Clear to ausculation otherwise  Assessment and Plan Samantha Raymond was seen today for cough and medication refill.  Diagnoses and all orders for this visit:  Acute URI Mild intermittent asthma with acute exacerbation -  Discussed asthma exacerbation -  Discussed increase advair to bid from once daily  -     albuterol (PROVENTIL HFA;VENTOLIN HFA) 108 (90 Base) MCG/ACT inhaler; Inhale 2 puffs into the lungs every 6 (six) hours as needed for wheezing or shortness of breath. -     predniSONE (DELTASONE) 50 MG tablet; Take 1 tablet (50 mg total) by mouth daily with breakfast. -     azithromycin (ZITHROMAX) 250 MG tablet; Take 2 tablets on day 1, then 1 tablet each day after     [place]

## 2016-02-04 ENCOUNTER — Encounter: Payer: Self-pay | Admitting: Family Medicine

## 2016-02-04 MED ORDER — FLUCONAZOLE 150 MG PO TABS: ORAL_TABLET | ORAL | 0 refills | Status: DC

## 2016-02-05 MED FILL — FLUCONAZOLE 150 MG TABLET: 150 | 3 days supply | Qty: 2 | Fill #0

## 2016-02-27 ENCOUNTER — Other Ambulatory Visit: Payer: Self-pay

## 2016-02-27 ENCOUNTER — Other Ambulatory Visit: Payer: 59

## 2016-02-27 ENCOUNTER — Encounter: Payer: Self-pay | Admitting: Internal Medicine

## 2016-02-27 DIAGNOSIS — E034 Atrophy of thyroid (acquired): Secondary | ICD-10-CM | POA: Diagnosis not present

## 2016-02-27 DIAGNOSIS — K760 Fatty (change of) liver, not elsewhere classified: Secondary | ICD-10-CM

## 2016-02-27 NOTE — Addendum Note (Signed)
Addended by: Gari Crown D on: 02/27/2016 05:57 PM   Modules accepted: Orders

## 2016-02-28 ENCOUNTER — Encounter: Payer: Self-pay | Admitting: Internal Medicine

## 2016-02-28 LAB — TSH: TSH: 0.53 u[IU]/mL (ref 0.450–4.500)

## 2016-02-28 MED ORDER — LEVOTHYROXINE SODIUM 25 MCG PO TABS: 25.0000 ug | ORAL_TABLET | Freq: Every day | ORAL | 0 refills | Status: DC

## 2016-03-19 ENCOUNTER — Other Ambulatory Visit: Payer: Self-pay | Admitting: Internal Medicine

## 2016-03-19 MED FILL — PHENTERMINE 37.5 MG TABLET: 37.5 | 30 days supply | Qty: 30 | Fill #2

## 2016-03-20 MED FILL — ESCITALOPRAM 10 MG TABLET: 10 | 90 days supply | Qty: 90 | Fill #0

## 2016-03-30 ENCOUNTER — Telehealth: Payer: 59 | Admitting: Family

## 2016-03-30 DIAGNOSIS — J111 Influenza due to unidentified influenza virus with other respiratory manifestations: Secondary | ICD-10-CM | POA: Diagnosis not present

## 2016-03-30 MED ORDER — OSELTAMIVIR PHOSPHATE 75 MG PO CAPS: 75.0000 mg | ORAL_CAPSULE | Freq: Two times a day (BID) | ORAL | 0 refills | Status: DC

## 2016-03-30 NOTE — Progress Notes (Signed)

## 2016-04-01 ENCOUNTER — Other Ambulatory Visit: Payer: Self-pay | Admitting: Internal Medicine

## 2016-04-01 DIAGNOSIS — Z1231 Encounter for screening mammogram for malignant neoplasm of breast: Secondary | ICD-10-CM

## 2016-04-13 DIAGNOSIS — D485 Neoplasm of uncertain behavior of skin: Secondary | ICD-10-CM | POA: Diagnosis not present

## 2016-04-13 DIAGNOSIS — L570 Actinic keratosis: Secondary | ICD-10-CM | POA: Diagnosis not present

## 2016-04-13 DIAGNOSIS — Z1283 Encounter for screening for malignant neoplasm of skin: Secondary | ICD-10-CM | POA: Diagnosis not present

## 2016-04-13 DIAGNOSIS — I8393 Asymptomatic varicose veins of bilateral lower extremities: Secondary | ICD-10-CM | POA: Diagnosis not present

## 2016-04-13 DIAGNOSIS — D229 Melanocytic nevi, unspecified: Secondary | ICD-10-CM | POA: Diagnosis not present

## 2016-04-13 DIAGNOSIS — L821 Other seborrheic keratosis: Secondary | ICD-10-CM | POA: Diagnosis not present

## 2016-04-13 DIAGNOSIS — D1800 Hemangioma unspecified site: Secondary | ICD-10-CM | POA: Diagnosis not present

## 2016-04-13 DIAGNOSIS — L814 Other melanin hyperpigmentation: Secondary | ICD-10-CM | POA: Diagnosis not present

## 2016-04-13 DIAGNOSIS — L578 Other skin changes due to chronic exposure to nonionizing radiation: Secondary | ICD-10-CM | POA: Diagnosis not present

## 2016-04-13 DIAGNOSIS — D225 Melanocytic nevi of trunk: Secondary | ICD-10-CM | POA: Diagnosis not present

## 2016-04-21 ENCOUNTER — Ambulatory Visit
Admission: RE | Admit: 2016-04-21 | Discharge: 2016-04-21 | Disposition: A | Discharge status: Home / Self Care | Payer: 59 | Source: Ambulatory Visit | Attending: Internal Medicine | Admitting: Internal Medicine

## 2016-04-21 DIAGNOSIS — Z1231 Encounter for screening mammogram for malignant neoplasm of breast: Secondary | ICD-10-CM

## 2016-05-18 MED FILL — LEVOTHYROXINE 25 MCG TABLET: 25 | 90 days supply | Qty: 90 | Fill #0

## 2016-05-25 ENCOUNTER — Encounter: Payer: Self-pay | Admitting: Internal Medicine

## 2016-05-26 ENCOUNTER — Other Ambulatory Visit: Payer: Self-pay | Admitting: Internal Medicine

## 2016-05-26 MED ORDER — MONTELUKAST SODIUM 10 MG PO TABS: 10.0000 mg | ORAL_TABLET | Freq: Every day | ORAL | 3 refills | Status: DC

## 2016-06-15 ENCOUNTER — Ambulatory Visit: Payer: Self-pay | Admitting: Internal Medicine

## 2016-06-29 ENCOUNTER — Ambulatory Visit (INDEPENDENT_AMBULATORY_CARE_PROVIDER_SITE_OTHER): Payer: 59 | Admitting: Physician Assistant

## 2016-06-29 ENCOUNTER — Encounter: Payer: Self-pay | Admitting: Physician Assistant

## 2016-06-29 ENCOUNTER — Ambulatory Visit (INDEPENDENT_AMBULATORY_CARE_PROVIDER_SITE_OTHER): Payer: 59

## 2016-06-29 VITALS — BP 146/85 | HR 65 | Temp 97.5°F | Resp 17 | Ht 63.25 in | Wt 169.0 lb

## 2016-06-29 DIAGNOSIS — M7731 Calcaneal spur, right foot: Secondary | ICD-10-CM | POA: Diagnosis not present

## 2016-06-29 DIAGNOSIS — M79641 Pain in right hand: Secondary | ICD-10-CM | POA: Diagnosis not present

## 2016-06-29 DIAGNOSIS — M79674 Pain in right toe(s): Secondary | ICD-10-CM

## 2016-06-29 DIAGNOSIS — R229 Localized swelling, mass and lump, unspecified: Secondary | ICD-10-CM | POA: Diagnosis not present

## 2016-06-29 DIAGNOSIS — M79642 Pain in left hand: Secondary | ICD-10-CM | POA: Diagnosis not present

## 2016-06-29 NOTE — Progress Notes (Deleted)
Samantha Raymond  MRN: [id number] DOB: 1960/05/22  Subjective:  Samantha Raymond is a 56 y.o. female seen in office today for a chief complaint of right big toe pain x 1 week.   Have bene having   RF in aunt    Review of Systems  Patient Active Problem List   Diagnosis Date Noted  . History of heat stroke 10/09/2014  . Family history of breast cancer in first degree relative 10/08/2014  . Fatty liver disease, nonalcoholic 83/38/2505  . 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 Unspecified vitamin D deficiency 08/24/2012  . Visit for preventive health examination 08/20/2012  . Menopause 08/17/2012  . Obesity (BMI 30-39.9) 08/09/2011  . Hypothyroidism 08/09/2011  . Recurrent urinary tract infection   . Neutropenia ([place]) 08/07/2011    Current Outpatient Prescriptions on File Prior to Visit  Medication Sig Dispense Refill  . albuterol (PROVENTIL HFA;VENTOLIN HFA) 108 (90 Base) MCG/ACT inhaler Inhale 2 puffs into the lungs every 6 (six) hours as needed for wheezing or shortness of breath. 1 Inhaler 3  . albuterol (PROVENTIL) (2.5 MG/3ML) 0.083% nebulizer solution Take 3 mLs (2.5 mg total) by nebulization every 6 (six) hours as needed for wheezing or shortness of breath. 150 mL 1  . Calcium-Vitamin D-Vitamin K (VIACTIV PO) Take by mouth.    . Cholecalciferol (VITAMIN D3) 1000 UNITS CAPS Take 1 capsule by mouth daily.    Marland Kitchen escitalopram (LEXAPRO) 10 MG tablet TAKE 1 TABLET BY MOUTH DAILY. 90 tablet 1  . montelukast (SINGULAIR) 10 MG tablet Take 1 tablet (10 mg total) by mouth at bedtime. 90 tablet 3  . Multiple Vitamin (MULTIVITAMIN) tablet Take 1 tablet by mouth daily.    Marland Kitchen triamcinolone (NASACORT) 55 MCG/ACT AERO nasal inhaler Place 2 sprays into the nose daily.    . Fluticasone-Salmeterol (ADVAIR DISKUS) 500-50 MCG/DOSE AEPB Inhale 1 puff into the lungs 2 (two) times daily. (Patient not taking: Reported on 06/29/2016) 60 each 2  . levothyroxine (SYNTHROID, LEVOTHROID) 25 MCG tablet  Take 1 tablet (25 mcg total) by mouth daily before breakfast. (Patient not taking: Reported on 06/29/2016) 90 tablet 0  . phentermine (ADIPEX-P) 37.5 MG tablet 1/2 tablet in the am and early afternoon (Patient not taking: Reported on 06/29/2016) 30 tablet 2   No current facility-administered medications on file prior to visit.     Allergies  Allergen Reactions  . Sulfa Antibiotics Nausea And Vomiting     Objective:  BP (!) 146/85 (BP Location: Right Arm, Patient Position: Sitting, Cuff Size: Large)   Pulse 65   Temp 97.5 F (36.4 C) (Oral)   Resp 17   Ht 5' 3.25" (1.607 m)   Wt 169 lb (76.7 kg)   SpO2 100%   BMI 29.70 kg/m   Physical Exam   Pain with deep palpation of right big toe joint   Dg Foot Complete Right  Result Date: 06/29/2016 CLINICAL DATA:  Right toe pain.  Heel pain. EXAM: RIGHT FOOT COMPLETE - 3+ VIEW COMPARISON:  None. FINDINGS: No fracture. The joints are normally spaced and aligned.  No arthropathic change. There is bony prominence from the medial head of the first metatarsal consistent with a bunion. Small plantar calcaneal spur. Normal soft tissues. IMPRESSION: 1. No fracture, dislocation or significant arthropathic change. 2. Bony prominence from the medial first metatarsal head reflecting a bunion. 3. Small plantar calcaneal spur. Electronically Signed   By: Lajean Manes M.D.   On: 06/29/2016 18:27    Assessment and  Plan :   There are no diagnoses linked to this encounter.   Tenna Delaine PA-C  [facility] 06/29/2016 6:32 PM

## 2016-06-29 NOTE — Patient Instructions (Addendum)
We will contact you with your xray results. For toe pain, this is likely related to due to your bunion, but could also be due to a sprain. Please start the stretches below.  I have given you some reading material on bunions and the recommended treatment. I do think you would do well with a night splint (you can find these online at [facility]). You can use OTC ibuprofen every 8 hours for pain and ice the affected area. If no improvement with NSAIDs or symptoms worsen please let me know. For your other blood work, we will contact you with those results in the next week. Thank you for letting me participate in your health and well being.    Turf Toe Rehab Ask your health care provider which exercises are safe for you. Do exercises exactly as told by your health care provider and adjust them as directed. It is normal to feel mild stretching, pulling, tightness, or discomfort as you do these exercises, but you should stop right away if you feel sudden pain or your pain gets worse. Do not begin these exercises until told by your health care provider. Stretching and range of motion exercises These exercises warm up your muscles and joints and improve the movement and flexibility of your foot. These exercises also help to relieve pain. Exercise A: Toe flexion   1. Sit with your left / rightleg crossed over your opposite knee. 2. Gently pull your big toe toward the bottom of your foot. You should feel a stretch on the top of your toe and foot. 3. Hold this position for __________ seconds. 4. Release your toe and return to the starting position. Repeat __________ times. Complete this exercise __________ times a day. Exercise B: Gastroc, standing   1. Stand with your hands against a wall. 2. Extend your left / right leg behind you, and bend your front knee slightly. Your heels should be on the floor. 3. Keeping your heels on the floor, keep your back knee straight and shift your weight toward the wall. You  should feel a gentle stretch in the back of your lower leg (calf). 4. Hold this position for __________ seconds. 5. Return to the starting position. Repeat __________ times. Complete this exercise __________ times a day. Exercise C: S[place]us, standing  1. Stand with your hands against a wall. 2. Extend your left / right leg behind you, and bend your front knee slightly. Your heels should be on the floor. 3. Keeping your heels on the floor, bend your back knee and shift your weight slightly over your back leg. You should feel a gentle stretch deep in your calf. 4. Hold this position for __________ seconds. 5. Return to the starting position. Repeat __________ times. Complete this exercise __________ times a day. Strengthening exercises These exercises build strength and endurance in your foot. Endurance is the ability to use your muscles for a long time, even after they get tired. Exercise D: Towel curls (  toe flexors) 1. Sit in a chair on a non-carpeted surface, and put your feet on the floor. 2. Place a towel in front of your feet. If told by your health care provider, add __________ to the end of the towel. 3. Keeping your heel on the floor, put your left / right foot on the towel. 4. Pull the towel toward your heel by grabbing the towel with your left / right toes and curling them under. Keep your heel on the floor while you do this.  5. Let your toes relax. 6. Grab the towel again. Keep going until the towel is completely underneath your foot. Repeat __________ times. Complete this exercise __________ times a day. Exercise E: Arch lifts ( foot intrinsics) 1. Sit in a chair with your feet flat on the floor. 2. Keeping your big toe and your heel on the floor, lift only your arch, which is on the inner edge of your left / right foot. Do not move your knees or scrunch your toes. This is a small movement. 3. Hold this position for __________ seconds. 4. Slowly return to the starting  position. Repeat __________ times. Complete this exercise __________ times a day. This information is not intended to replace advice given to you by your health care provider. Make sure you discuss any questions you have with your health care provider. Document Released: 02/02/2005 Document Revised: 10/10/2015 Document Reviewed: 12/17/2014 Elsevier Interactive Patient Education  2017 [employer].     IF you received an x-ray today, you will receive an invoice from [facility]. Please contact [facility] at [phone] with questions or concerns regarding your invoice.   IF you received labwork today, you will receive an invoice from [facility]. Please contact [facility] at [phone] with questions or concerns regarding your invoice.   Our billing staff will not be able to assist you with questions regarding bills from these companies.  You will be contacted with the lab results as soon as they are available. The fastest way to get your results is to activate your My Chart account. Instructions are located on the last page of this paperwork. If you have not heard from [place] regarding the results in 2 weeks, please contact this office.

## 2016-06-30 LAB — RHEUMATOID FACTOR: Rhuematoid fact SerPl-aCnc: <10 IU/mL (ref 0.0–13.9)

## 2016-06-30 LAB — SEDIMENTATION RATE: Sed Rate: 2 mm/hr (ref 0–40)

## 2016-06-30 LAB — CYCLIC CITRUL PEPTIDE ANTIBODY, IGG/IGA: Cyclic Citrullin Peptide Ab: 15 units (ref 0–19)

## 2016-06-30 LAB — ANA: ANA: NEGATIVE

## 2016-06-30 LAB — URIC ACID: URIC ACID: 4.1 mg/dL (ref 2.5–7.1)

## 2016-06-30 LAB — C-REACTIVE PROTEIN: CRP: 2.3 mg/L (ref 0.0–4.9)

## 2016-06-30 NOTE — Progress Notes (Signed)
Samantha Raymond  MRN: [id number] DOB: Mar 12, 1960  Subjective:   Samantha Raymond is a 56 y.o. female who presents with right foot pain. Onset of the symptoms was a week ago. Precipitating event: none known. Current symptoms include: pain at 1st MTP joint. Aggravating factors: standing for long periods. Symptoms have been intermittent. Patient has had prior foot problems. Has a bunion and plantar fascitis. Wears tight, narrow heels frequently. Evaluation to date: none. Treatment to date: rest and OTC ibuprofen as needed.  She would also like to discuss possible testing for rheumatoid arthritis (RA). Notes over the past few months she has developed bony prominences in her bilateral hands and right heel. There are times when these nodules "flare up" and will be painful. Has associated intermittent joint stiffness in the morning and chronic fatige. Pt is active, walks daily and plays golf frequently. Has personal hx hypothyroidism. Was taking synthroid 12.5mg  every other day but notes she stopped taking it a week ago because her PCP wants to see if she even needs thyroid medication anymore. Has FH of RA in aunt.   Review of Systems  Cardiovascular: Negative for palpitations.  Endocrine: Negative for cold intolerance and heat intolerance.  Skin: Negative for rash.  Neurological: Negative for weakness.    Patient Active Problem List   Diagnosis Date Noted  . History of heat stroke 10/09/2014  . Family history of breast cancer in first degree relative 10/08/2014  . Fatty liver disease, nonalcoholic 89/16/9450  . 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 Unspecified vitamin D deficiency 08/24/2012  . Visit for preventive health examination 08/20/2012  . Menopause 08/17/2012  . Obesity (BMI 30-39.9) 08/09/2011  . Hypothyroidism 08/09/2011  . Recurrent urinary tract infection   . Neutropenia ([place]) 08/07/2011    Current Outpatient Prescriptions on File Prior to Visit  Medication Sig Dispense  Refill  . albuterol (PROVENTIL HFA;VENTOLIN HFA) 108 (90 Base) MCG/ACT inhaler Inhale 2 puffs into the lungs every 6 (six) hours as needed for wheezing or shortness of breath. 1 Inhaler 3  . albuterol (PROVENTIL) (2.5 MG/3ML) 0.083% nebulizer solution Take 3 mLs (2.5 mg total) by nebulization every 6 (six) hours as needed for wheezing or shortness of breath. 150 mL 1  . Calcium-Vitamin D-Vitamin K (VIACTIV PO) Take by mouth.    . Cholecalciferol (VITAMIN D3) 1000 UNITS CAPS Take 1 capsule by mouth daily.    Marland Kitchen escitalopram (LEXAPRO) 10 MG tablet TAKE 1 TABLET BY MOUTH DAILY. 90 tablet 1  . montelukast (SINGULAIR) 10 MG tablet Take 1 tablet (10 mg total) by mouth at bedtime. 90 tablet 3  . Multiple Vitamin (MULTIVITAMIN) tablet Take 1 tablet by mouth daily.    Marland Kitchen triamcinolone (NASACORT) 55 MCG/ACT AERO nasal inhaler Place 2 sprays into the nose daily.    . Fluticasone-Salmeterol (ADVAIR DISKUS) 500-50 MCG/DOSE AEPB Inhale 1 puff into the lungs 2 (two) times daily. (Patient not taking: Reported on 06/29/2016) 60 each 2  . levothyroxine (SYNTHROID, LEVOTHROID) 25 MCG tablet Take 1 tablet (25 mcg total) by mouth daily before breakfast. (Patient not taking: Reported on 06/29/2016) 90 tablet 0  . phentermine (ADIPEX-P) 37.5 MG tablet 1/2 tablet in the am and early afternoon (Patient not taking: Reported on 06/29/2016) 30 tablet 2   No current facility-administered medications on file prior to visit.     Allergies  Allergen Reactions  . Sulfa Antibiotics Nausea And Vomiting       Social History   Social History  . Marital status:  Married    Spouse name: N/A  . Number of children: N/A  . Years of education: N/A   Occupational History  . Not on file.   Social History Main Topics  . Smoking status: Never Smoker  . Smokeless tobacco: Never Used  . Alcohol use 0.0 oz/week  . Drug use: No  . Sexual activity: Not on file   Other Topics Concern  . Not on file   Social History Narrative  . No  narrative on file    Objective:  BP (!) 146/85 (BP Location: Right Arm, Patient Position: Sitting, Cuff Size: Large)   Pulse 65   Temp 97.5 F (36.4 C) (Oral)   Resp 17   Ht 5' 3.25" (1.607 m)   Wt 169 lb (76.7 kg)   SpO2 100%   BMI 29.70 kg/m   Physical Exam  Constitutional: She is oriented to person, place, and time and well-developed, well-nourished, and in no distress.  HENT:  Head: Normocephalic and atraumatic.  Eyes: Conjunctivae are normal.  Neck: Normal range of motion.  Pulmonary/Chest: Effort normal.  Musculoskeletal:       Right foot: There is tenderness (with deep palpation of 1st MTP ) and deformity (hallux valgus present ). There is normal range of motion, no bony tenderness, no swelling and normal capillary refill.  No redness or warmth palpated at 1st MTP.   Neurological: She is alert and oriented to person, place, and time. Gait normal.  Skin: Skin is warm and dry.  Multiple small (3-85mm) firm nodules palpated at various locations of bilateral hands and right achilles. (anterior aspect of carpals, 1st IP joints, and 2nd DIP joint)  Psychiatric: Affect normal.  Vitals reviewed.   Dg Foot Complete Right  Result Date: 06/29/2016 CLINICAL DATA:  Right toe pain.  Heel pain. EXAM: RIGHT FOOT COMPLETE - 3+ VIEW COMPARISON:  None. FINDINGS: No fracture. The joints are normally spaced and aligned.  No arthropathic change. There is bony prominence from the medial head of the first metatarsal consistent with a bunion. Small plantar calcaneal spur. Normal soft tissues. IMPRESSION: 1. No fracture, dislocation or significant arthropathic change. 2. Bony prominence from the medial first metatarsal head reflecting a bunion. 3. Small plantar calcaneal spur. Electronically Signed   By: Lajean Manes M.D.   On: 06/29/2016 18:27     Assessment and Plan :  1. Toe pain, right Plain films reassuring for no acute findings. Plain films do show bony prominence from medial first  metatarsal head reflecting a bunion. Hx and PE also consistent with bunion pain. Encouraged to discontinue wearing narrow toed shoes and heels. Given educational material on bunions and conservative management. Recommended purchasing a night brace.  - DG Foot Complete Right; Future - Uric Acid  2. Generalized subcutaneous nodules Due to PE findings and FH of RA, will check rheumatoid panel. Labs pending.  - Sedimentation Rate - Rheumatoid factor - C-reactive protein - ANA - CYCLIC CITRUL PEPTIDE ANTIBODY, IGG/IGA  Tenna Delaine, PA-C  [facility] 06/30/2016 3:38 PM

## 2016-07-02 MED FILL — ESCITALOPRAM 10 MG TABLET: 10 | 90 days supply | Qty: 90 | Fill #1

## 2016-07-14 IMAGING — US US ABDOMEN LIMITED
1 series · 14 of 23 positions shown · non-contrast
Comparison: None.

CLINICAL DATA: Elevated liver function studies

EXAM:
US ABDOMEN LIMITED - RIGHT UPPER QUADRANT

[Series 1: us abdomen limited · 0.30mm/px · 14 of 23 slices shown]
[im 1/23]
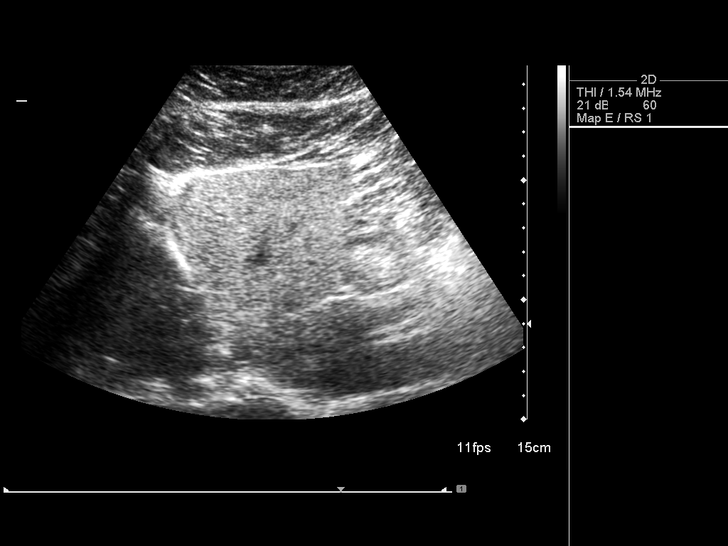
[im 3/23]
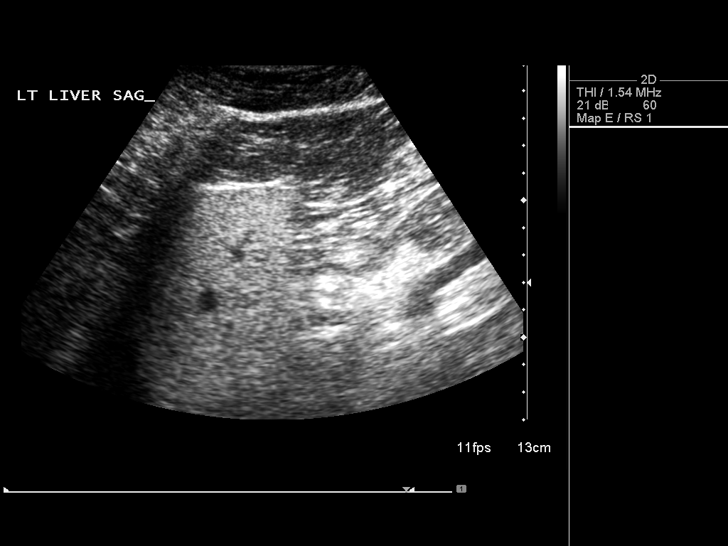
[im 5/23]
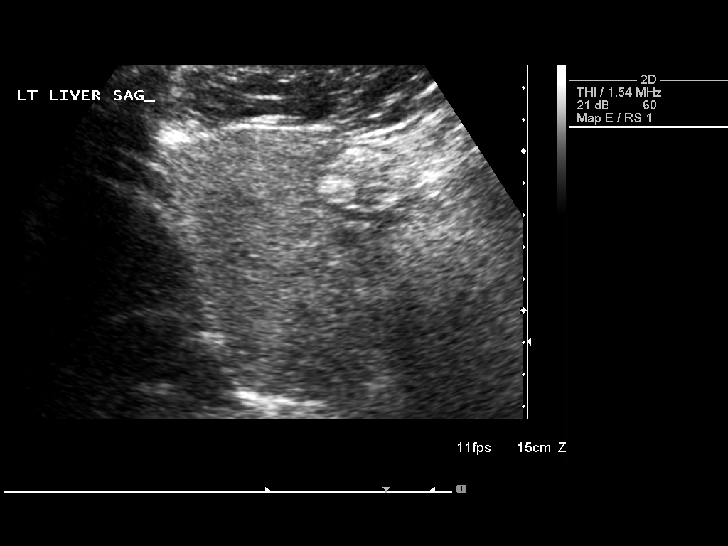
[im 6/23]
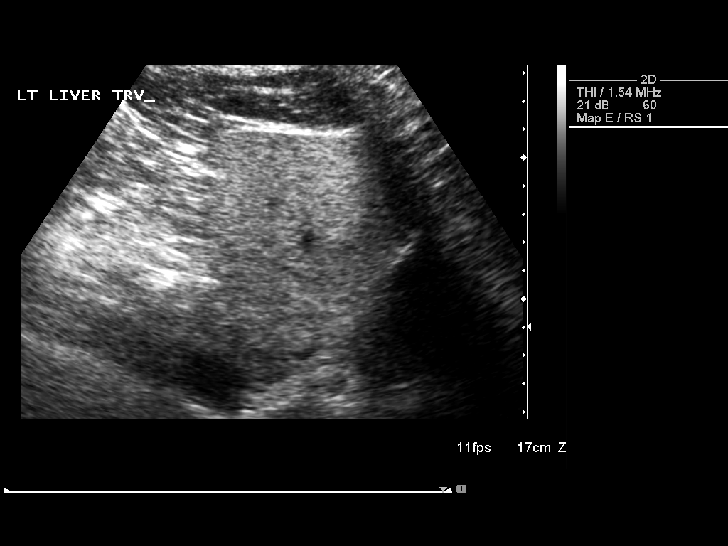
[im 8/23]
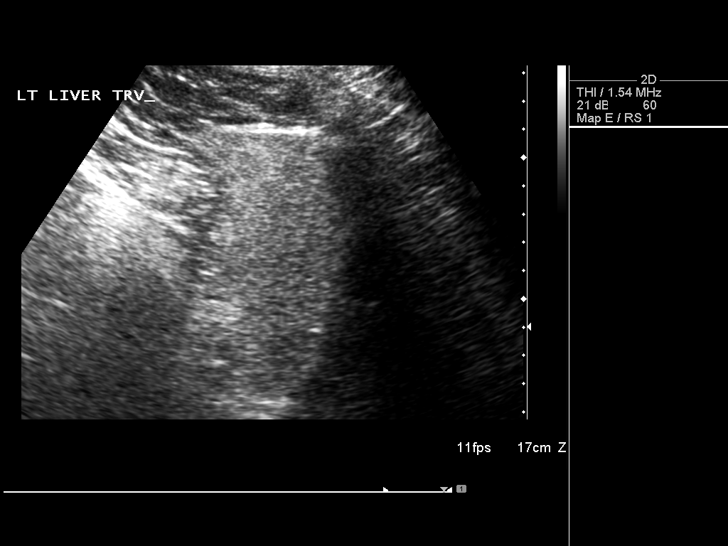
[im 10/23]
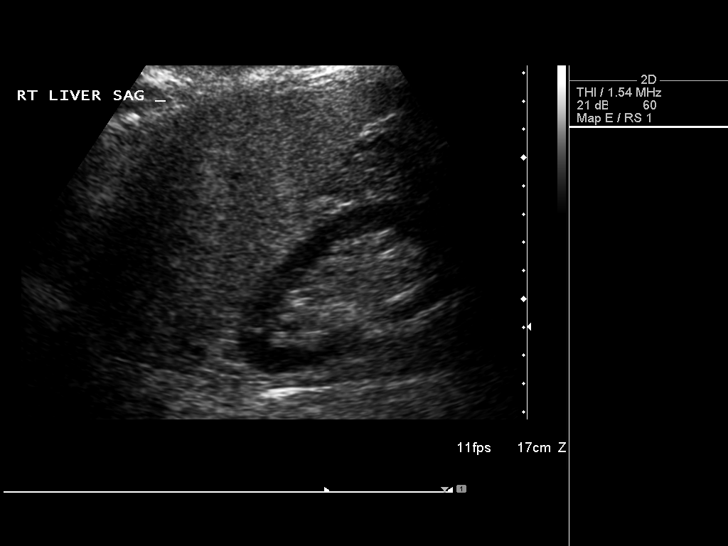
[im 11/23]
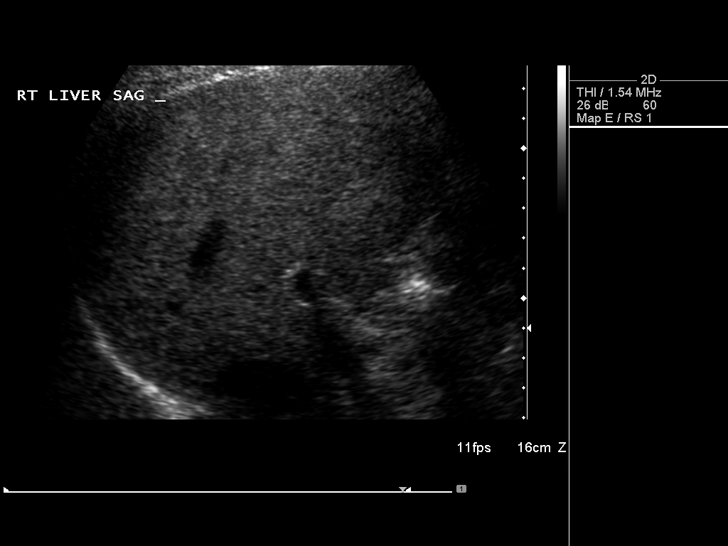
[im 13/23]
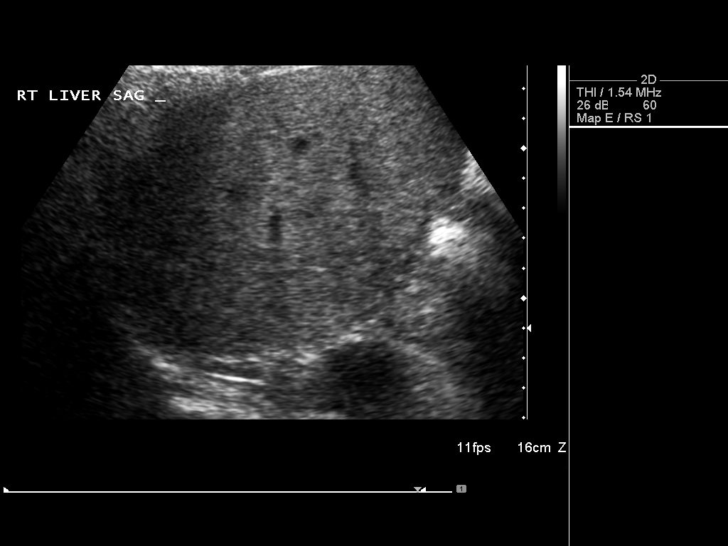
[im 14/23]
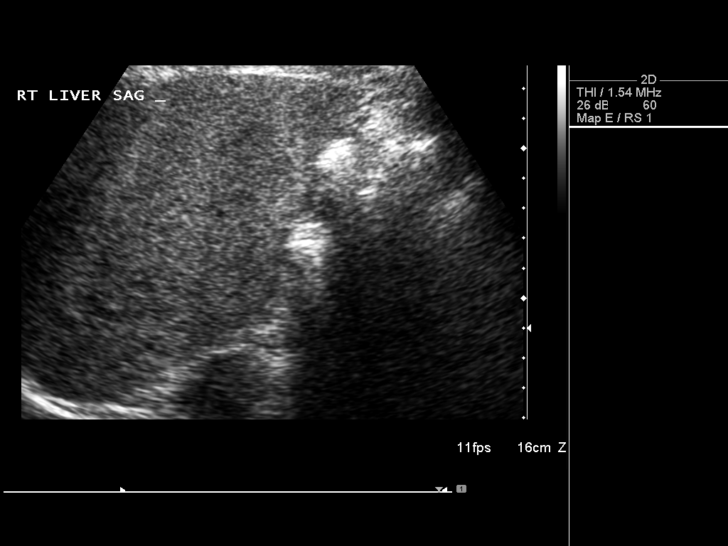
[im 16/23]
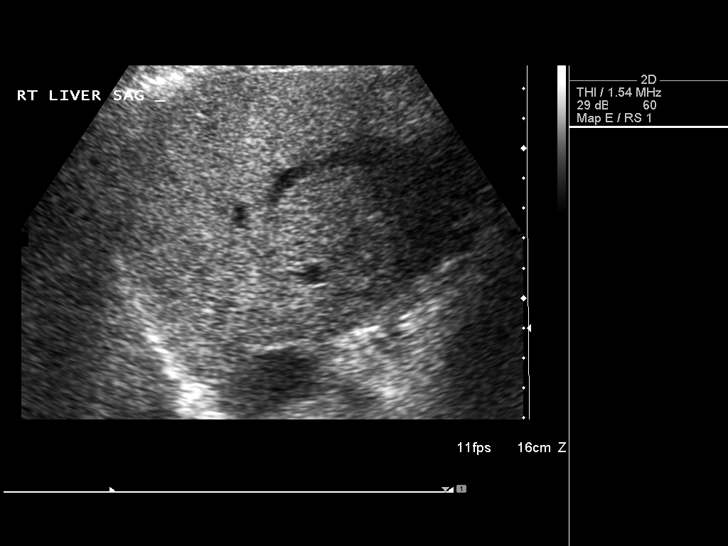
[im 18/23]
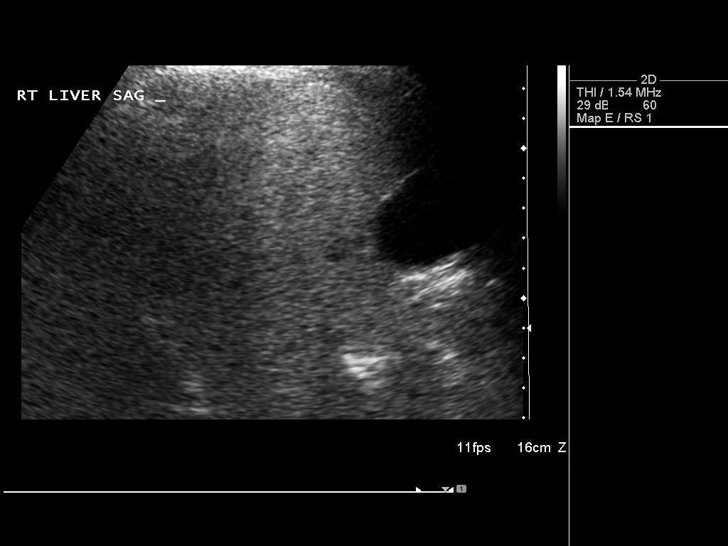
[im 19/23]
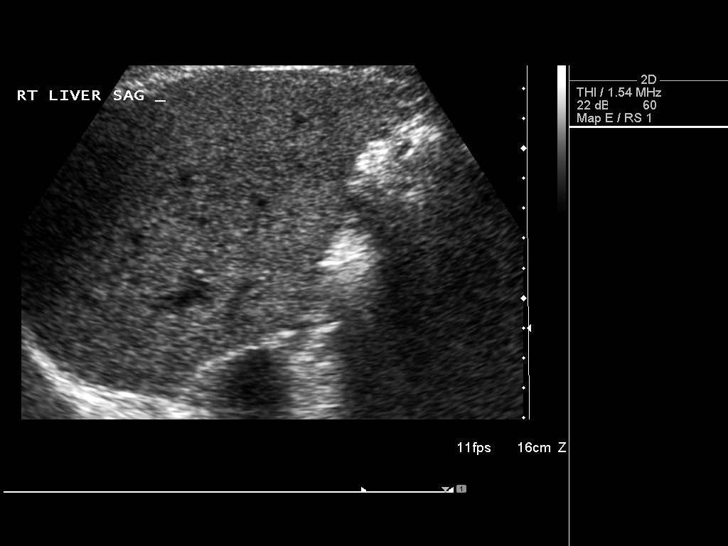
[im 21/23]
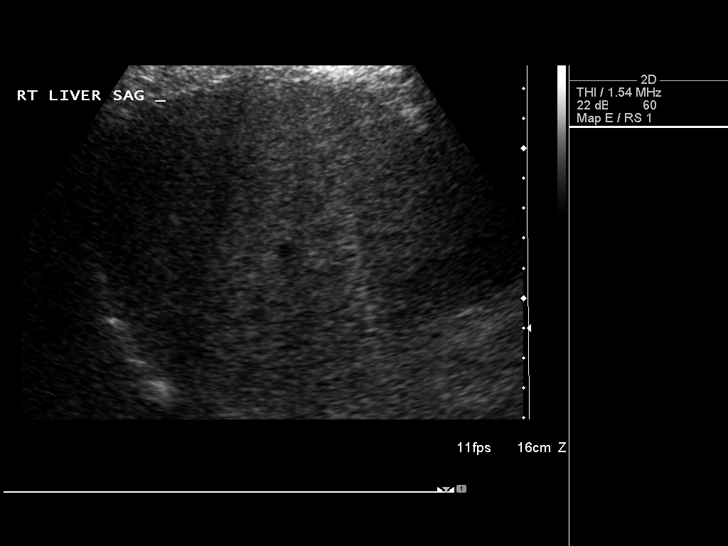
[im 23/23]
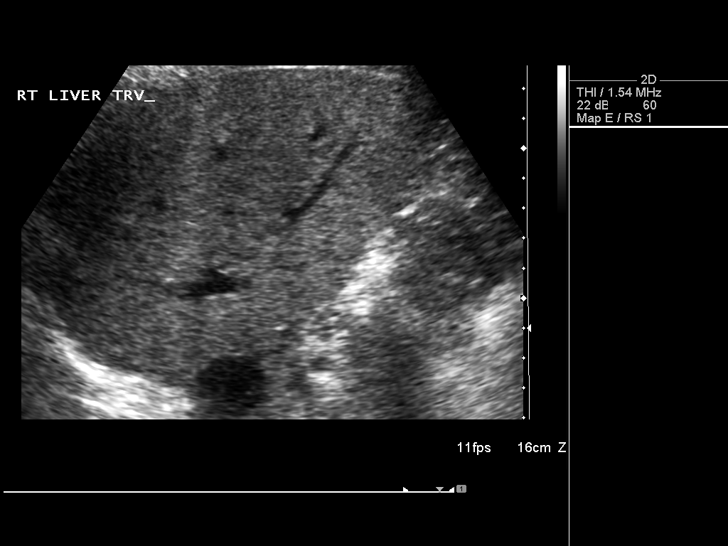

[14 of 23 positions shown; findings below may reference images not displayed]

FINDINGS: Gallbladder:

No gallstones or wall thickening visualized. No sonographic Murphy
sign noted.

Common bile duct:

Diameter: 5.6 mm

Liver:

The hepatic echotexture is increased diffusely. There is no focal
mass or ductal dilation.
IMPRESSION: 1. Fatty infiltrative change of the liver.
2. Normal appearance of the gallbladder and common bile duct.

## 2016-08-11 ENCOUNTER — Encounter: Payer: Self-pay | Admitting: Family Medicine

## 2016-08-11 ENCOUNTER — Ambulatory Visit (INDEPENDENT_AMBULATORY_CARE_PROVIDER_SITE_OTHER): Payer: 59

## 2016-08-11 ENCOUNTER — Ambulatory Visit (INDEPENDENT_AMBULATORY_CARE_PROVIDER_SITE_OTHER): Payer: 59 | Admitting: Family Medicine

## 2016-08-11 VITALS — BP 119/82 | HR 85 | Temp 99.4°F | Resp 17 | Ht 63.0 in | Wt 193.0 lb

## 2016-08-11 DIAGNOSIS — R197 Diarrhea, unspecified: Secondary | ICD-10-CM | POA: Diagnosis not present

## 2016-08-11 DIAGNOSIS — R1084 Generalized abdominal pain: Secondary | ICD-10-CM

## 2016-08-11 DIAGNOSIS — R1031 Right lower quadrant pain: Secondary | ICD-10-CM

## 2016-08-11 DIAGNOSIS — R112 Nausea with vomiting, unspecified: Secondary | ICD-10-CM

## 2016-08-11 DIAGNOSIS — R42 Dizziness and giddiness: Secondary | ICD-10-CM

## 2016-08-11 LAB — POCT URINALYSIS DIP (MANUAL ENTRY)
GLUCOSE UA: NEGATIVE mg/dL
Leukocytes, UA: NEGATIVE
NITRITE UA: NEGATIVE
PH UA: 5.5 (ref 5.0–8.0)
Protein Ur, POC: 30 mg/dL — AB
RBC UA: NEGATIVE
UROBILINOGEN UA: 1 U/dL

## 2016-08-11 LAB — COMPREHENSIVE METABOLIC PANEL
ALBUMIN: 4.2 g/dL (ref 3.5–5.5)
ALK PHOS: 88 IU/L (ref 39–117)
ALT: 58 IU/L — ABNORMAL HIGH (ref 0–32)
AST: 55 IU/L — AB (ref 0–40)
Albumin/Globulin Ratio: 2.1 (ref 1.2–2.2)
BILIRUBIN TOTAL: 0.6 mg/dL (ref 0.0–1.2)
BUN / CREAT RATIO: 13 (ref 9–23)
BUN: 11 mg/dL (ref 6–24)
CHLORIDE: 101 mmol/L (ref 96–106)
CO2: 25 mmol/L (ref 20–29)
CREATININE: 0.87 mg/dL (ref 0.57–1.00)
Calcium: 9.2 mg/dL (ref 8.7–10.2)
GFR calc Af Amer: 86 mL/min/{1.73_m2} (ref >59)
GFR calc non Af Amer: 74 mL/min/{1.73_m2} (ref >59)
GLOBULIN, TOTAL: 2 g/dL (ref 1.5–4.5)
Glucose: 107 mg/dL — ABNORMAL HIGH (ref 65–99)
POTASSIUM: 4 mmol/L (ref 3.5–5.2)
SODIUM: 140 mmol/L (ref 134–144)
Total Protein: 6.2 g/dL (ref 6.0–8.5)

## 2016-08-11 LAB — POCT CBC
GRANULOCYTE PERCENT: 78.6 % (ref 37–80)
HCT, POC: 40 % (ref 37.7–47.9)
HEMOGLOBIN: 13.8 g/dL (ref 12.2–16.2)
Lymph, poc: 1.5 (ref 0.6–3.4)
MCH: 30.5 pg (ref 27–31.2)
MCHC: 34.5 g/dL (ref 31.8–35.4)
MCV: 88.4 fL (ref 80–97)
MID (CBC): 0.3 (ref 0–0.9)
MPV: 7.3 fL (ref 0–99.8)
PLATELET COUNT, POC: 246 10*3/uL (ref 142–424)
POC Granulocyte: 6.6 (ref 2–6.9)
POC LYMPH PERCENT: 17.7 %L (ref 10–50)
POC MID %: 3.7 % (ref 0–12)
RBC: 4.53 M/uL (ref 4.04–5.48)
RDW, POC: 13.6 %
WBC: 8.4 10*3/uL (ref 4.6–10.2)

## 2016-08-11 NOTE — Progress Notes (Signed)
Chief Complaint  Patient presents with  . Nausea  . Dizziness  . Abdominal Pain    HPI  Pt reports that on Sunday, 2 days ago she started having nausea and took zofran then six hours later phergan, then six hours later another phenergan She states that she vomited by the end of the day and started having more nausea and 24 hours later an episode of diarrhea.  She states that she has abdominal cramping She has been drinking water and gatorade and ginger ale She reports that she gets sweats and may have had a fever She has not tried any new foods She recently returned from [facility] No sick contacts  She felt dizzy this morning so she ate 1/2 slice of toast and a scrambled egg.    Past Medical History:  Diagnosis Date  . hypothyroid   . Recurrent urinary tract infection     Current Outpatient Prescriptions  Medication Sig Dispense Refill  . albuterol (PROVENTIL HFA;VENTOLIN HFA) 108 (90 Base) MCG/ACT inhaler Inhale 2 puffs into the lungs every 6 (six) hours as needed for wheezing or shortness of breath. 1 Inhaler 3  . albuterol (PROVENTIL) (2.5 MG/3ML) 0.083% nebulizer solution Take 3 mLs (2.5 mg total) by nebulization every 6 (six) hours as needed for wheezing or shortness of breath. 150 mL 1  . Calcium-Vitamin D-Vitamin K (VIACTIV PO) Take by mouth.    . Cholecalciferol (VITAMIN D3) 1000 UNITS CAPS Take 1 capsule by mouth daily.    Marland Kitchen escitalopram (LEXAPRO) 10 MG tablet TAKE 1 TABLET BY MOUTH DAILY. 90 tablet 1  . Fluticasone-Salmeterol (ADVAIR DISKUS) 500-50 MCG/DOSE AEPB Inhale 1 puff into the lungs 2 (two) times daily. 60 each 2  . levothyroxine (SYNTHROID, LEVOTHROID) 25 MCG tablet Take 1 tablet (25 mcg total) by mouth daily before breakfast. 90 tablet 0  . montelukast (SINGULAIR) 10 MG tablet Take 1 tablet (10 mg total) by mouth at bedtime. 90 tablet 3  . Multiple Vitamin (MULTIVITAMIN) tablet Take 1 tablet by mouth daily.    . phentermine (ADIPEX-P) 37.5 MG tablet 1/2  tablet in the am and early afternoon 30 tablet 2  . triamcinolone (NASACORT) 55 MCG/ACT AERO nasal inhaler Place 2 sprays into the nose daily.     No current facility-administered medications for this visit.     Allergies:  Allergies  Allergen Reactions  . Sulfa Antibiotics Nausea And Vomiting    No past surgical history on file.  Social History   Social History  . Marital status: Married    Spouse name: N/A  . Number of children: N/A  . Years of education: N/A   Social History Main Topics  . Smoking status: Never Smoker  . Smokeless tobacco: Never Used  . Alcohol use 0.0 oz/week  . Drug use: No  . Sexual activity: Not Asked   Other Topics Concern  . None   Social History Narrative  . None    ROS Review of Systems See HPI Skin: No rash or itching Eyes: no blurry vision, no double vision GU: no dysuria or hematuria  Objective: Vitals:   08/11/16 1038  BP: 119/82  Pulse: 85  Resp: 17  Temp: 99.4 F (37.4 C)  TempSrc: Oral  SpO2: 98%  Weight: 193 lb (87.5 kg)  Height: 5\' 3"  (1.6 m)    Physical Exam  Constitutional: She is oriented to person, place, and time. She appears well-developed and well-nourished.  HENT:  Head: Normocephalic and atraumatic.  Nose: Nose normal.  Mouth/Throat: Oropharynx is clear and moist.  Eyes: Conjunctivae and EOM are normal.  Cardiovascular: Normal rate, regular rhythm and normal heart sounds.   Pulmonary/Chest: Effort normal and breath sounds normal. No respiratory distress. She has no wheezes.  Abdominal: Soft. Bowel sounds are normal. There is generalized tenderness and tenderness in the right lower quadrant. There is no rigidity, no rebound, no guarding, no CVA tenderness, no tenderness at McBurney's point and negative Murphy's sign.    Neurological: She is alert and oriented to person, place, and time.   Lab Results  Component Value Date   WBC 8.4 08/11/2016   HGB 13.8 08/11/2016   HCT 40.0 08/11/2016   MCV 88.4  08/11/2016   PLT 240.0 10/08/2014   Component     Latest Ref Rng & Units 08/11/2016 08/11/2016        11:07 AM 11:07 AM  Color, UA     yellow yellow   Clarity, UA     clear clear   Glucose     negative mg/dL negative   Bilirubin, UA     negative small (A) trace (5) (A)  Specific Gravity, UA     1.010 - 1.025 >=1.030 (A)   RBC, UA     negative negative   pH, UA     5.0 - 8.0 5.5   Protein, UA     negative mg/dL =30 (A)   Urobilinogen, UA     0.2 or 1.0 E.U./dL 1.0   Nitrite, UA     Negative Negative   Leukocytes, UA     Negative Negative     Assessment and Plan Circe was seen today for nausea, dizziness and abdominal pain.  Diagnoses and all orders for this visit:  Dizziness and giddiness -     POCT CBC -     POCT urinalysis dipstick -     Comprehensive metabolic panel  Right lower quadrant abdominal pain -     DG Abd 1 View -     POCT CBC -     POCT urinalysis dipstick -     Comprehensive metabolic panel  Non-intractable vomiting with nausea, unspecified vomiting type -     DG Abd 1 View -     POCT CBC -     POCT urinalysis dipstick -     Comprehensive metabolic panel  Diarrhea, unspecified type -     POCT CBC -     POCT urinalysis dipstick -     Comprehensive metabolic panel  Generalized abdominal pain -     DG Abd 1 View -     POCT CBC -     POCT urinalysis dipstick -     Comprehensive metabolic panel   Discussed ginger tea for nausea Continue water and electrolyte drinks for hydration Will refill zofran Advised pt to slowly advance diet Since pt does not have any obstructive gas pattern or ileus and no leukocystosis will monitor closely Discussed that the differential includes gastroenteritis, ovarian cyst, appendix  She should call if her symptoms worsen Will get CT scan if symptoms do not improve in the next 24 hours   A total of 25 minutes were spent face-to-face with the patient during this encounter and over half of that time was spent  on counseling and coordination of care.  [place]

## 2016-08-11 NOTE — Patient Instructions (Addendum)
   IF you received an x-ray today, you will receive an invoice from [facility]. Please contact [facility] at [phone] with questions or concerns regarding your invoice.   IF you received labwork today, you will receive an invoice from [facility]. Please contact [facility] at [phone] with questions or concerns regarding your invoice.   Our billing staff will not be able to assist you with questions regarding bills from these companies.  You will be contacted with the lab results as soon as they are available. The fastest way to get your results is to activate your My Chart account. Instructions are located on the last page of this paperwork. If you have not heard from us regarding the results in 2 weeks, please contact this office.     Dizziness Dizziness is a common problem. It is a feeling of unsteadiness or light-headedness. You may feel like you are about to faint. Dizziness can lead to injury if you stumble or fall. Anyone can become dizzy, but dizziness is more common in older adults. This condition can be caused by a number of things, including medicines, dehydration, or illness. Follow these instructions at home: Taking these steps may help with your condition: Eating and drinking  Drink enough fluid to keep your urine clear or pale yellow. This helps to keep you from becoming dehydrated. Try to drink more clear fluids, such as water.  Do not drink alcohol.  Limit your caffeine intake if directed by your health care provider.  Limit your salt intake if directed by your health care provider. Activity  Avoid making quick movements. ? Rise slowly from chairs and steady yourself until you feel okay. ? In the morning, first sit up on the side of the bed. When you feel okay, stand slowly while you hold onto something until you know that your balance is fine.  Move your legs often if you need to stand in one place for a long time. Tighten and relax your  muscles in your legs while you are standing.  Do not drive or operate heavy machinery if you feel dizzy.  Avoid bending down if you feel dizzy. Place items in your home so that they are easy for you to reach without leaning over. Lifestyle  Do not use any tobacco products, including cigarettes, chewing tobacco, or electronic cigarettes. If you need help quitting, ask your health care provider.  Try to reduce your stress level, such as with yoga or meditation. Talk with your health care provider if you need help. General instructions  Watch your dizziness for any changes.  Take medicines only as directed by your health care provider. Talk with your health care provider if you think that your dizziness is caused by a medicine that you are taking.  Tell a friend or a family member that you are feeling dizzy. If he or she notices any changes in your behavior, have this person call your health care provider.  Keep all follow-up visits as directed by your health care provider. This is important. Contact a health care provider if:  Your dizziness does not go away.  Your dizziness or light-headedness gets worse.  You feel nauseous.  You have reduced hearing.  You have new symptoms.  You are unsteady on your feet or you feel like the room is spinning. Get help right away if:  You vomit or have diarrhea and are unable to eat or drink anything.  You have problems talking, walking, swallowing, or using your arms,   hands, or legs.  You feel generally weak.  You are not thinking clearly or you have trouble forming sentences. It may take a friend or family member to notice this.  You have chest pain, abdominal pain, shortness of breath, or sweating.  Your vision changes.  You notice any bleeding.  You have a headache.  You have neck pain or a stiff neck.  You have a fever. This information is not intended to replace advice given to you by your health care provider. Make sure you  discuss any questions you have with your health care provider. Document Released: 07/29/2000 Document Revised: 07/11/2015 Document Reviewed: 01/29/2014 Elsevier Interactive Patient Education  2017 [employer]  

## 2016-09-08 ENCOUNTER — Encounter: Payer: Self-pay | Admitting: Internal Medicine

## 2016-09-22 ENCOUNTER — Encounter: Payer: Self-pay | Admitting: Family Medicine

## 2016-09-22 ENCOUNTER — Ambulatory Visit (INDEPENDENT_AMBULATORY_CARE_PROVIDER_SITE_OTHER): Payer: 59 | Admitting: Family Medicine

## 2016-09-22 ENCOUNTER — Ambulatory Visit (INDEPENDENT_AMBULATORY_CARE_PROVIDER_SITE_OTHER): Payer: 59

## 2016-09-22 VITALS — BP 129/68 | HR 64 | Temp 98.5°F | Resp 16 | Ht 63.0 in | Wt 202.0 lb

## 2016-09-22 DIAGNOSIS — S99912A Unspecified injury of left ankle, initial encounter: Secondary | ICD-10-CM | POA: Diagnosis not present

## 2016-09-22 DIAGNOSIS — M79671 Pain in right foot: Secondary | ICD-10-CM

## 2016-09-22 DIAGNOSIS — M25571 Pain in right ankle and joints of right foot: Secondary | ICD-10-CM | POA: Diagnosis not present

## 2016-09-22 DIAGNOSIS — S93431A Sprain of tibiofibular ligament of right ankle, initial encounter: Secondary | ICD-10-CM

## 2016-09-22 DIAGNOSIS — S99921A Unspecified injury of right foot, initial encounter: Secondary | ICD-10-CM | POA: Diagnosis not present

## 2016-09-22 DIAGNOSIS — S93491A Sprain of other ligament of right ankle, initial encounter: Secondary | ICD-10-CM

## 2016-09-22 NOTE — Progress Notes (Addendum)
Subjective:  By signing my name below, I, Samantha Raymond, attest that this documentation has been prepared under the direction and in the presence of [name], MD. Electronically Signed: [name], [place]. 09/22/2016 , 9:07 AM .  Patient was seen in Room 11 .   Patient ID: Samantha Raymond, female    DOB: 08/21/1960, 56 y.o.   MRN: [id number] Chief Complaint  Patient presents with  . Ankle Injury    right ankle and foot bruising from fall 8/4   HPI Samantha Raymond is a 56 y.o. female  Patient was out of town over the weekend. [place], and the area was dark. She leaned in to help her cousin and missed the last step. She felt her right ankle roll to the side at that time (3 days ago). She pushed through and continued to bear weight on it. She notes she was wearing heels with a strap across the front at the time. She noticed bruising and swelling gradually worsening. She's been wearing shoes with compression. She hasn't taken any OTC medications for it. She has kept her foot elevated and iced the area. She returned home 9:00PM last night after 8 hour drive. She denies any knee pain.   Patient Active Problem List   Diagnosis Date Noted  . History of heat stroke 10/09/2014  . Family history of breast cancer in first degree relative 10/08/2014  . Fatty liver disease, nonalcoholic 29/92/4268  . 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 Unspecified vitamin D deficiency 08/24/2012  . Visit for preventive health examination 08/20/2012  . Menopause 08/17/2012  . Obesity (BMI 30-39.9) 08/09/2011  . Hypothyroidism 08/09/2011  . Recurrent urinary tract infection   . Neutropenia ([place]) 08/07/2011   Past Medical History:  Diagnosis Date  . Asthma   . hypothyroid   . Recurrent urinary tract infection    Past Surgical History:  Procedure Laterality Date  . CESAREAN SECTION     Allergies  Allergen Reactions  . Sulfa Antibiotics Nausea And  Vomiting   Prior to Admission medications   Medication Sig Start Date End Date Taking? Authorizing Provider  albuterol (PROVENTIL HFA;VENTOLIN HFA) 108 (90 Base) MCG/ACT inhaler Inhale 2 puffs into the lungs every 6 (six) hours as needed for wheezing or shortness of breath. 01/24/16  Yes Stallings, Zoe A, MD  albuterol (PROVENTIL) (2.5 MG/3ML) 0.083% nebulizer solution Take 3 mLs (2.5 mg total) by nebulization every 6 (six) hours as needed for wheezing or shortness of breath. 04/08/15  Yes Shawnee Knapp, MD  Calcium-Vitamin D-Vitamin K (VIACTIV PO) Take by mouth.   Yes [provider]  escitalopram (LEXAPRO) 10 MG tablet TAKE 1 TABLET BY MOUTH DAILY. 03/20/16  Yes Crecencio Mc, MD  levothyroxine (SYNTHROID, LEVOTHROID) 25 MCG tablet Take 1 tablet (25 mcg total) by mouth daily before breakfast. 02/28/16  Yes Crecencio Mc, MD  Multiple Vitamin (MULTIVITAMIN) tablet Take 1 tablet by mouth daily.   Yes [provider]  triamcinolone (NASACORT) 55 MCG/ACT AERO nasal inhaler Place 2 sprays into the nose daily.   Yes [provider]  Cholecalciferol (VITAMIN D3) 1000 UNITS CAPS Take 1 capsule by mouth daily.    [provider]  Fluticasone-Salmeterol (ADVAIR DISKUS) 500-50 MCG/DOSE AEPB Inhale 1 puff into the lungs 2 (two) times daily. Patient not taking: Reported on 09/22/2016 05/22/15   Shawnee Knapp, MD  montelukast (SINGULAIR) 10 MG tablet Take 1 tablet (10 mg total)  by mouth at bedtime. Patient not taking: Reported on 09/22/2016 05/26/16   Crecencio Mc, MD  phentermine (ADIPEX-P) 37.5 MG tablet 1/2 tablet in the am and early afternoon Patient not taking: Reported on 09/22/2016 10/09/15   Crecencio Mc, MD   Social History   Social History  . Marital status: Married    Spouse name: N/A  . Number of children: N/A  . Years of education: N/A   Occupational History  . Not on file.   Social History Main Topics  . Smoking status: Never Smoker  . Smokeless tobacco:  Never Used  . Alcohol use 0.0 oz/week  . Drug use: No  . Sexual activity: Not on file   Other Topics Concern  . Not on file   Social History Narrative  . No narrative on file   Review of Systems  Constitutional: Negative for chills, fatigue, fever and unexpected weight change.  Respiratory: Negative for cough.   Gastrointestinal: Negative for constipation, diarrhea, nausea and vomiting.  Musculoskeletal: Positive for arthralgias and joint swelling. Negative for gait problem.  Skin: Negative for rash and wound.  Neurological: Negative for dizziness, weakness and headaches.       Objective:   Physical Exam  Constitutional: She is oriented to person, place, and time. She appears well-developed and well-nourished. No distress.  HENT:  Head: Normocephalic and atraumatic.  Eyes: Pupils are equal, round, and reactive to light. EOM are normal.  Neck: Neck supple.  Cardiovascular: Normal rate.   Pulmonary/Chest: Effort normal. No respiratory distress.  Musculoskeletal: Normal range of motion.  Right knee: right lateral knee non tender, fibula head non tender Right ankle: positive squeeze distal tibfib Right foot: minimal tenderness over navicula, medial malleolus non tender, slight tenderness proximal 4th metatarsal, 1st-3rd non tender, 5th metatarsal including styloid non tender, lateral malleolus is tender; soft tissue swelling with some ecchymosis right lateral foot, tender along the ATF, achilles non tender, negative Thompson's, tender along distal 3rd of the fibula, slightly guarded ROM, negative talar tilt, minimal laxity with drawer  Neurological: She is alert and oriented to person, place, and time.  Skin: Skin is warm and dry.  Psychiatric: She has a normal mood and affect. Her behavior is normal.  Nursing note and vitals reviewed. Neurovascular intact distally  Vitals:   09/22/16 0843  BP: 129/68  Pulse: 64  Resp: 16  Temp: 98.5 F (36.9 C)  TempSrc: Oral  SpO2: 96%    Weight: 202 lb (91.6 kg)  Height: 5\' 3"  (1.6 m)   Dg Ankle Complete Right  Result Date: 09/22/2016 CLINICAL DATA:  Inversion injury 3 days ago. Fourth metatarsal base pain. EXAM: RIGHT ANKLE - COMPLETE 3+ VIEW COMPARISON:  None. FINDINGS: There is no evidence of fracture, dislocation, or joint effusion. Mild periarticular swelling. IMPRESSION: No acute osseous finding. Electronically Signed   By: Monte Fantasia M.D.   On: 09/22/2016 09:27   Dg Foot Complete Right  Result Date: 09/22/2016 CLINICAL DATA:  Right foot pain after injury 3 days ago. EXAM: RIGHT FOOT COMPLETE - 3+ VIEW COMPARISON:  Radiographs of Jun 29, 2016. FINDINGS: There is no evidence of fracture or dislocation. There is no evidence of arthropathy. Mild spurring of posterior calcaneus is noted. Probable bunion seen medial to first metatarsophalangeal joint. IMPRESSION: No acute abnormality seen in the right foot. Electronically Signed   By: Marijo Conception, M.D.   On: 09/22/2016 09:25       Assessment & Plan:    Samantha  Chauncey Cruel Raymond is a 56 y.o. female Acute right ankle pain - Plan: DG Ankle Complete Right, Apply cam walker  Right foot pain - Plan: DG Foot Complete Right, Apply cam walker  High ankle sprain of right lower extremity, initial encounter - Plan: Apply cam walker  Right lateral ankle sprain, date of injury 09/19/16. Has pain distal fibula, positive squeeze along with lateral ankle into lateral dorsal foot. Suspected lateral ankle sprain with some components of high ankle sprain. No apparent fracture seen on imaging, but is fairly tender over distal third of fibula.  - Placed into a Cam Walker with range of motion only initially out of walker once to twice per day  - Ice, elevation, handout given.  - Planning for Cam Walker for 10-14 days with follow-up office visit for repeat x-ray, then possibly transition to lace up ankle brace depending on symptoms.   - RTC sooner if worse.  No orders of the defined types were  placed in this encounter.  Patient Instructions   X-rays look okay, but as we discussed would recommend repeat x-ray in 2 weeks to make sure there is no break on that distal fibula. For now wear the Cam Walker throughout the day, range of motion exercises initially as tolerated, ibuprofen over-the-counter up to 600 mg every 6 hours with food. Let me know if there are questions in the meantime.    Ankle Sprain, Phase I Rehab Ask your health care provider which exercises are safe for you. Do exercises exactly as told by your health care provider and adjust them as directed. It is normal to feel mild stretching, pulling, tightness, or discomfort as you do these exercises, but you should stop right away if you feel sudden pain or your pain gets worse.Do not begin these exercises until told by your health care provider (once range of motion is less painful).  Stretching and range of motion exercises These exercises warm up your muscles and joints and improve the movement and flexibility of your lower leg and ankle. These exercises also help to relieve pain and stiffness. Exercise A: Gastroc and soleus stretch  1. Sit on the floor with your left / right leg extended. 2. Loop a belt or towel around the ball of your left / right foot. The ball of your foot is on the walking surface, right under your toes. 3. Keep your left / right ankle and foot relaxed and keep your knee straight while you use the belt or towel to pull your foot toward you. You should feel a gentle stretch behind your calf or knee. 4. Hold this position for __________ seconds, then release to the starting position. Repeat the exercise with your knee bent. You can put a pillow or a rolled bath towel under your knee to support it. You should feel a stretch deep in your calf or at your Achilles tendon. Repeat each stretch __________ times. Complete these stretches __________ times a day. Exercise B: Ankle alphabet  1. Sit with your left  / right leg supported at the lower leg. ? Do not rest your foot on anything. ? Make sure your foot has room to move freely. 2. Think of your left / right foot as a paintbrush, and move your foot to trace each letter of the alphabet in the air. Keep your hip and knee still while you trace. Make the letters as large as you can without feeling discomfort. 3. Trace every letter from A to Z. Repeat __________ times.  Complete this exercise __________ times a day. Strengthening exercises These exercises build strength and endurance in your ankle and lower leg. Endurance is the ability to use your muscles for a long time, even after they get tired. Exercise C: Dorsiflexors  1. Secure a rubber exercise band or tube to an object, such as a table leg, that will stay still when the band is pulled. Secure the other end around your left / right foot. 2. Sit on the floor facing the object, with your left / right leg extended. The band or tube should be slightly tense when your foot is relaxed. 3. Slowly bring your foot toward you, pulling the band tighter. 4. Hold this position for __________ seconds. 5. Slowly return your foot to the starting position. Repeat __________ times. Complete this exercise __________ times a day. Exercise D: Plantar flexors  1. Sit on the floor with your left / right leg extended. 2. Loop a rubber exercise tube or band around the ball of your left / right foot. The ball of your foot is on the walking surface, right under your toes. ? Hold the ends of the band or tube in your hands. ? The band or tube should be slightly tense when your foot is relaxed. 3. Slowly point your foot and toes downward, pushing them away from you. 4. Hold this position for __________ seconds. 5. Slowly return your foot to the starting position. Repeat __________ times. Complete this exercise __________ times a day. Exercise E: Evertors 1. Sit on the floor with your legs straight out in front of  you. 2. Loop a rubber exercise band or tube around the ball of your left / right foot. The ball of your foot is on the walking surface, right under your toes. ? Hold the ends of the band in your hands, or secure the band to a stable object. ? The band or tube should be slightly tense when your foot is relaxed. 3. Slowly push your foot outward, away from your other leg. 4. Hold this position for __________ seconds. 5. Slowly return your foot to the starting position. Repeat __________ times. Complete this exercise __________ times a day. This information is not intended to replace advice given to you by your health care provider. Make sure you discuss any questions you have with your health care provider. Document Released: 09/03/2004 Document Revised: 10/10/2015 Document Reviewed: 12/17/2014 Elsevier Interactive Patient Education  2018 [employer].  Ankle Sprain An ankle sprain is a stretch or tear in one of the tough, fiber-like tissues (ligaments) in the ankle. The ligaments in your ankle help to hold the bones of the ankle together. What are the causes? This condition is often caused by stepping on or falling on the outer edge of the foot. What increases the risk? This condition is more likely to develop in people who play sports. What are the signs or symptoms? Symptoms of this condition include:  Pain in your ankle.  Swelling.  Bruising. Bruising may develop right after you sprain your ankle or 1-2 days later.  Trouble standing or walking, especially when you turn or change directions.  How is this diagnosed? This condition is diagnosed with a physical exam. During the exam, your health care provider will press on certain parts of your foot and ankle and try to move them in certain ways. X-rays may be taken to see how severe the sprain is and to check for broken bones. How is this treated? This condition may be  treated with:  A brace. This is used to keep the ankle from  moving until it heals.  An elastic bandage. This is used to support the ankle.  Crutches.  Pain medicine.  Surgery. This may be needed if the sprain is severe.  Physical therapy. This may help to improve the range of motion in the ankle.  Follow these instructions at home:  Rest your ankle.  Take over-the-counter and prescription medicines only as told by your health care provider.  For 2-3 days, keep your ankle raised (elevated) above the level of your heart as much as possible.  If directed, apply ice to the area: ? Put ice in a plastic bag. ? Place a towel between your skin and the bag. ? Leave the ice on for 20 minutes, 2-3 times a day.  If you were given a brace: ? Wear it as directed. ? Remove it to shower or bathe. ? Try not to move your ankle much, but wiggle your toes from time to time. This helps to prevent swelling.  If you were given an elastic bandage (dressing): ? Remove it to shower or bathe. ? Try not to move your ankle much, but wiggle your toes from time to time. This helps to prevent swelling. ? Adjust the dressing to make it more comfortable if it feels too tight. ? Loosen the dressing if you have numbness or tingling in your foot, or if your foot becomes cold and blue.  If you have crutches, use them as told by your health care provider. Continue to use them until you can walk without feeling pain in your ankle. Contact a health care provider if:  You have rapidly increasing bruising or swelling.  Your pain is not relieved with medicine. Get help right away if:  Your toes or foot becomes numb or blue.  You have severe pain that gets worse. This information is not intended to replace advice given to you by your health care provider. Make sure you discuss any questions you have with your health care provider. Document Released: 02/02/2005 Document Revised: 06/12/2015 Document Reviewed: 09/04/2014 Elsevier Interactive Patient Education  2017  [employer].    IF you received an x-ray today, you will receive an invoice from [facility]. Please contact [facility] at [phone] with questions or concerns regarding your invoice.   IF you received labwork today, you will receive an invoice from [facility]. Please contact [facility] at [phone] with questions or concerns regarding your invoice.   Our billing staff will not be able to assist you with questions regarding bills from these companies.  You will be contacted with the lab results as soon as they are available. The fastest way to get your results is to activate your My Chart account. Instructions are located on the last page of this paperwork. If you have not heard from [place] regarding the results in 2 weeks, please contact this office.       I personally performed the services described in this documentation, which was scribed in my presence. The recorded information has been reviewed and considered for accuracy and completeness, addended by me as needed, and agree with information above.  Signed,   [name], MD [facility] [place].  09/22/16 9:44 AM

## 2016-09-22 NOTE — Patient Instructions (Addendum)
X-rays look okay, but as we discussed would recommend repeat x-ray in 2 weeks to make sure there is no break on that distal fibula. For now wear the Cam Walker throughout the day, range of motion exercises initially as tolerated, ibuprofen over-the-counter up to 600 mg every 6 hours with food. Let me know if there are questions in the meantime.    Ankle Sprain, Phase I Rehab Ask your health care provider which exercises are safe for you. Do exercises exactly as told by your health care provider and adjust them as directed. It is normal to feel mild stretching, pulling, tightness, or discomfort as you do these exercises, but you should stop right away if you feel sudden pain or your pain gets worse.Do not begin these exercises until told by your health care provider (once range of motion is less painful).  Stretching and range of motion exercises These exercises warm up your muscles and joints and improve the movement and flexibility of your lower leg and ankle. These exercises also help to relieve pain and stiffness. Exercise A: Gastroc and soleus stretch  1. Sit on the floor with your left / right leg extended. 2. Loop a belt or towel around the ball of your left / right foot. The ball of your foot is on the walking surface, right under your toes. 3. Keep your left / right ankle and foot relaxed and keep your knee straight while you use the belt or towel to pull your foot toward you. You should feel a gentle stretch behind your calf or knee. 4. Hold this position for __________ seconds, then release to the starting position. Repeat the exercise with your knee bent. You can put a pillow or a rolled bath towel under your knee to support it. You should feel a stretch deep in your calf or at your Achilles tendon. Repeat each stretch __________ times. Complete these stretches __________ times a day. Exercise B: Ankle alphabet  1. Sit with your left / right leg supported at the lower leg. ? Do not rest  your foot on anything. ? Make sure your foot has room to move freely. 2. Think of your left / right foot as a paintbrush, and move your foot to trace each letter of the alphabet in the air. Keep your hip and knee still while you trace. Make the letters as large as you can without feeling discomfort. 3. Trace every letter from A to Z. Repeat __________ times. Complete this exercise __________ times a day. Strengthening exercises These exercises build strength and endurance in your ankle and lower leg. Endurance is the ability to use your muscles for a long time, even after they get tired. Exercise C: Dorsiflexors  1. Secure a rubber exercise band or tube to an object, such as a table leg, that will stay still when the band is pulled. Secure the other end around your left / right foot. 2. Sit on the floor facing the object, with your left / right leg extended. The band or tube should be slightly tense when your foot is relaxed. 3. Slowly bring your foot toward you, pulling the band tighter. 4. Hold this position for __________ seconds. 5. Slowly return your foot to the starting position. Repeat __________ times. Complete this exercise __________ times a day. Exercise D: Plantar flexors  1. Sit on the floor with your left / right leg extended. 2. Loop a rubber exercise tube or band around the ball of your left / right foot.  The ball of your foot is on the walking surface, right under your toes. ? Hold the ends of the band or tube in your hands. ? The band or tube should be slightly tense when your foot is relaxed. 3. Slowly point your foot and toes downward, pushing them away from you. 4. Hold this position for __________ seconds. 5. Slowly return your foot to the starting position. Repeat __________ times. Complete this exercise __________ times a day. Exercise E: Evertors 1. Sit on the floor with your legs straight out in front of you. 2. Loop a rubber exercise band or tube around the ball  of your left / right foot. The ball of your foot is on the walking surface, right under your toes. ? Hold the ends of the band in your hands, or secure the band to a stable object. ? The band or tube should be slightly tense when your foot is relaxed. 3. Slowly push your foot outward, away from your other leg. 4. Hold this position for __________ seconds. 5. Slowly return your foot to the starting position. Repeat __________ times. Complete this exercise __________ times a day. This information is not intended to replace advice given to you by your health care provider. Make sure you discuss any questions you have with your health care provider. Document Released: 09/03/2004 Document Revised: 10/10/2015 Document Reviewed: 12/17/2014 Elsevier Interactive Patient Education  2018 [place].  Ankle Sprain An ankle sprain is a stretch or tear in one of the tough, fiber-like tissues (ligaments) in the ankle. The ligaments in your ankle help to hold the bones of the ankle together. What are the causes? This condition is often caused by stepping on or falling on the outer edge of the foot. What increases the risk? This condition is more likely to develop in people who play sports. What are the signs or symptoms? Symptoms of this condition include:  Pain in your ankle.  Swelling.  Bruising. Bruising may develop right after you sprain your ankle or 1-2 days later.  Trouble standing or walking, especially when you turn or change directions.  How is this diagnosed? This condition is diagnosed with a physical exam. During the exam, your health care provider will press on certain parts of your foot and ankle and try to move them in certain ways. X-rays may be taken to see how severe the sprain is and to check for broken bones. How is this treated? This condition may be treated with:  A brace. This is used to keep the ankle from moving until it heals.  An elastic bandage. This is used to  support the ankle.  Crutches.  Pain medicine.  Surgery. This may be needed if the sprain is severe.  Physical therapy. This may help to improve the range of motion in the ankle.  Follow these instructions at home:  Rest your ankle.  Take over-the-counter and prescription medicines only as told by your health care provider.  For 2-3 days, keep your ankle raised (elevated) above the level of your heart as much as possible.  If directed, apply ice to the area: ? Put ice in a plastic bag. ? Place a towel between your skin and the bag. ? Leave the ice on for 20 minutes, 2-3 times a day.  If you were given a brace: ? Wear it as directed. ? Remove it to shower or bathe. ? Try not to move your ankle much, but wiggle your toes from time to time. This  helps to prevent swelling.  If you were given an elastic bandage (dressing): ? Remove it to shower or bathe. ? Try not to move your ankle much, but wiggle your toes from time to time. This helps to prevent swelling. ? Adjust the dressing to make it more comfortable if it feels too tight. ? Loosen the dressing if you have numbness or tingling in your foot, or if your foot becomes cold and blue.  If you have crutches, use them as told by your health care provider. Continue to use them until you can walk without feeling pain in your ankle. Contact a health care provider if:  You have rapidly increasing bruising or swelling.  Your pain is not relieved with medicine. Get help right away if:  Your toes or foot becomes numb or blue.  You have severe pain that gets worse. This information is not intended to replace advice given to you by your health care provider. Make sure you discuss any questions you have with your health care provider. Document Released: 02/02/2005 Document Revised: 06/12/2015 Document Reviewed: 09/04/2014 Elsevier Interactive Patient Education  2017 [employer].    IF you received an x-ray today, you will receive  an invoice from [facility]. Please contact [facility] at [phone] with questions or concerns regarding your invoice.   IF you received labwork today, you will receive an invoice from [place]. Please contact [facility] at [phone] with questions or concerns regarding your invoice.   Our billing staff will not be able to assist you with questions regarding bills from these companies.  You will be contacted with the lab results as soon as they are available. The fastest way to get your results is to activate your My Chart account. Instructions are located on the last page of this paperwork. If you have not heard from [place] regarding the results in 2 weeks, please contact this office.

## 2016-09-28 ENCOUNTER — Encounter: Payer: Self-pay | Admitting: Internal Medicine

## 2016-09-28 NOTE — Telephone Encounter (Signed)
Pt called requesting this refill. Pt wants to know if she needs to come in sooner than 10/27/16 to have this medication refilled.

## 2016-10-05 ENCOUNTER — Ambulatory Visit (INDEPENDENT_AMBULATORY_CARE_PROVIDER_SITE_OTHER): Payer: 59 | Admitting: Internal Medicine

## 2016-10-05 ENCOUNTER — Encounter: Payer: Self-pay | Admitting: Internal Medicine

## 2016-10-05 VITALS — BP 122/78 | HR 69 | Temp 98.7°F | Resp 20 | Wt 207.2 lb

## 2016-10-05 DIAGNOSIS — E669 Obesity, unspecified: Secondary | ICD-10-CM | POA: Diagnosis not present

## 2016-10-05 DIAGNOSIS — E785 Hyperlipidemia, unspecified: Secondary | ICD-10-CM

## 2016-10-05 DIAGNOSIS — E039 Hypothyroidism, unspecified: Secondary | ICD-10-CM | POA: Diagnosis not present

## 2016-10-05 DIAGNOSIS — R748 Abnormal levels of other serum enzymes: Secondary | ICD-10-CM | POA: Diagnosis not present

## 2016-10-05 MED ORDER — PHENTERMINE HCL 37.5 MG PO TABS: ORAL_TABLET | ORAL | 2 refills | Status: DC

## 2016-10-05 MED FILL — PHENTERMINE 37.5 MG TABLET: 37.5 | 30 days supply | Qty: 30 | Fill #0

## 2016-10-05 NOTE — Progress Notes (Signed)
Subjective:  Patient ID: Samantha Raymond, female    DOB: [birth year]  Age: 56 y.o. MRN: [id number]  CC: The primary encounter diagnosis was Hyperlipidemia LDL goal <130. Diagnoses of Acquired hypothyroidism, Elevated liver enzymes, and Obesity (BMI 30-39.9) were also pertinent to this visit.  HPI Samantha Raymond presents for follow up on obesity. Last seen one year ago,  Treated with phentermine with good results  But patient was lost to follow up so medication refill was denied until she was seen.   When she was Last seen in August 2017.    Weight was 172 lbs, got down to 169 in December  Kept it off until March 2018.  The her job became too demanding,  (averages 50 hours per week),  Multiple family illnesses started stressing her out  And she host her momentum,  Stopped exercising and started gaining weight.   currently 207 (up 35 lbs ).  Phentermine  was refilled in August 2017 for 3 months .   tearful about her loss of control over her life .    Had a GI illness in June   Liver enzymes were elevated, has not been rechecked.  Fatty liver on 2016 ultrasound   Fell 2 weeks ago and suffered a hairline fracture of distal fibula suspected by ortho  (  The fall occurred  while attending and performing at her niece's funeral) .  Has a repeat  x ray tomorrow .  Wearing  The boot        Outpatient Medications Prior to Visit  Medication Sig Dispense Refill  . albuterol (PROVENTIL HFA;VENTOLIN HFA) 108 (90 Base) MCG/ACT inhaler Inhale 2 puffs into the lungs every 6 (six) hours as needed for wheezing or shortness of breath. 1 Inhaler 3  . albuterol (PROVENTIL) (2.5 MG/3ML) 0.083% nebulizer solution Take 3 mLs (2.5 mg total) by nebulization every 6 (six) hours as needed for wheezing or shortness of breath. 150 mL 1  . Calcium-Vitamin D-Vitamin K (VIACTIV PO) Take by mouth.    . Cholecalciferol (VITAMIN D3) 1000 UNITS CAPS Take 1 capsule by mouth daily.    Marland Kitchen escitalopram (LEXAPRO) 10 MG tablet  TAKE 1 TABLET BY MOUTH DAILY. 90 tablet 1  . levothyroxine (SYNTHROID, LEVOTHROID) 25 MCG tablet Take 1 tablet (25 mcg total) by mouth daily before breakfast. 90 tablet 0  . Multiple Vitamin (MULTIVITAMIN) tablet Take 1 tablet by mouth daily.    Marland Kitchen triamcinolone (NASACORT) 55 MCG/ACT AERO nasal inhaler Place 2 sprays into the nose daily.     No facility-administered medications prior to visit.     Review of Systems;  Patient denies headache, fevers, malaise, unintentional weight loss, skin rash, eye pain, sinus congestion and sinus pain, sore throat, dysphagia,  hemoptysis , cough, dyspnea, wheezing, chest pain, palpitations, orthopnea, edema, abdominal pain, nausea, melena, diarrhea, constipation, flank pain, dysuria, hematuria, urinary  Frequency, nocturia, numbness, tingling, seizures,  Focal weakness, Loss of consciousness,  Tremor, insomnia, depression, anxiety, and suicidal ideation.      Objective:  BP 122/78 (BP Location: Left Arm, Patient Position: Sitting, Cuff Size: Normal)   Pulse 69   Temp 98.7 F (37.1 C) (Oral)   Resp 20   Wt 207 lb 4 oz (94 kg)   SpO2 98%   BMI 36.71 kg/m   BP Readings from Last 3 Encounters:  10/06/16 122/82  10/05/16 122/78  09/22/16 129/68    Wt Readings from Last 3 Encounters:  10/05/16 207 lb 4 oz (94  kg)  09/22/16 202 lb (91.6 kg)  08/11/16 193 lb (87.5 kg)    General appearance: alert, cooperative and appears stated age Ears: normal TM's and external ear canals both ears Throat: lips, mucosa, and tongue normal; teeth and gums normal Neck: no adenopathy, no carotid bruit, supple, symmetrical, trachea midline and thyroid not enlarged, symmetric, no tenderness/mass/nodules Back: symmetric, no curvature. ROM normal. No CVA tenderness. Lungs: clear to auscultation bilaterally Heart: regular rate and rhythm, S1, S2 normal, no murmur, click, rub or gallop Abdomen: soft, non-tender; bowel sounds normal; no masses,  no organomegaly Pulses: 2+  and symmetric Skin: Skin color, texture, turgor normal. No rashes or lesions Lymph nodes: Cervical, supraclavicular, and axillary nodes normal.  No results found for: HGBA1C  Lab Results  Component Value Date   CREATININE 0.87 08/11/2016   CREATININE 0.83 10/09/2015   CREATININE 0.84 09/16/2015    Lab Results  Component Value Date   WBC 8.4 08/11/2016   HGB 13.8 08/11/2016   HCT 40.0 08/11/2016   PLT 240.0 10/08/2014   GLUCOSE 107 (H) 08/11/2016   CHOL 201 (H) 10/08/2014   TRIG 84.0 10/08/2014   HDL 62.20 10/08/2014   LDLCALC 122 (H) 10/08/2014   ALT 58 (H) 08/11/2016   AST 55 (H) 08/11/2016   NA 140 08/11/2016   K 4.0 08/11/2016   CL 101 08/11/2016   CREATININE 0.87 08/11/2016   BUN 11 08/11/2016   CO2 25 08/11/2016   TSH 0.530 02/27/2016    Mm Screening Breast Tomo Bilateral  Result Date: 04/21/2016 CLINICAL DATA:  Screening. EXAM: 2D DIGITAL SCREENING BILATERAL MAMMOGRAM WITH CAD AND ADJUNCT TOMO COMPARISON:  Previous exam(s). ACR Breast Density Category b: There are scattered areas of fibroglandular density. FINDINGS: There are no findings suspicious for malignancy. Images were processed with CAD. IMPRESSION: No mammographic evidence of malignancy. A result letter of this screening mammogram will be mailed directly to the patient. RECOMMENDATION: Screening mammogram in one year. (Code:SM-B-01Y) BI-RADS CATEGORY  1: Negative. Electronically Signed   By: Lajean Manes M.D.   On: 04/21/2016 12:33    Assessment & Plan:   Problem List Items Addressed This Visit    Hypothyroidism   Relevant Orders   TSH   T4, free   Obesity (BMI 30-39.9)    A total of 30 minutes was spent in face to face time with patient,  more than half of which was spent in counseling patient on the to balance work with health priorities.  , reviewing diet and exercise regimen,   explaining recent labs and previous imaging studies done, and coordination of care.  Phentermine refilled for 3 months        Relevant Medications   phentermine (ADIPEX-P) 37.5 MG tablet    Other Visit Diagnoses    Hyperlipidemia LDL goal <130    -  Primary   Relevant Orders   Lipid panel   Elevated liver enzymes       Relevant Orders   Comprehensive metabolic panel      I am having Ms. Makarewicz maintain her Calcium-Vitamin D-Vitamin K (VIACTIV PO), multivitamin, Vitamin D3, triamcinolone, albuterol, albuterol, levothyroxine, escitalopram, and phentermine.  Meds ordered this encounter  Medications  . phentermine (ADIPEX-P) 37.5 MG tablet    Sig: 1/2 tablet in the am and early afternoon    Dispense:  30 tablet    Refill:  2    There are no discontinued medications.  Follow-up: No Follow-up on file.   Crecencio Mc, MD

## 2016-10-05 NOTE — Patient Instructions (Signed)
I have written a prescription for  phentermine for 3 months and faxed it to [facility] . Marland Kitchen   Please have your vital signs checked a week after starting and let me know if it is rasing your BP or pulse

## 2016-10-06 ENCOUNTER — Encounter: Payer: Self-pay | Admitting: Family Medicine

## 2016-10-06 ENCOUNTER — Ambulatory Visit (INDEPENDENT_AMBULATORY_CARE_PROVIDER_SITE_OTHER): Payer: 59 | Admitting: Family Medicine

## 2016-10-06 ENCOUNTER — Ambulatory Visit (INDEPENDENT_AMBULATORY_CARE_PROVIDER_SITE_OTHER): Payer: 59

## 2016-10-06 VITALS — BP 122/82 | HR 69 | Temp 98.2°F | Resp 16 | Ht 63.0 in

## 2016-10-06 DIAGNOSIS — M79671 Pain in right foot: Secondary | ICD-10-CM

## 2016-10-06 DIAGNOSIS — S93401D Sprain of unspecified ligament of right ankle, subsequent encounter: Secondary | ICD-10-CM | POA: Diagnosis not present

## 2016-10-06 DIAGNOSIS — S99921A Unspecified injury of right foot, initial encounter: Secondary | ICD-10-CM | POA: Diagnosis not present

## 2016-10-06 LAB — COMPREHENSIVE METABOLIC PANEL
ALT: 37 U/L — ABNORMAL HIGH (ref 0–35)
AST: 31 U/L (ref 0–37)
Albumin: 4.1 g/dL (ref 3.5–5.2)
Alkaline Phosphatase: 83 U/L (ref 39–117)
BUN: 16 mg/dL (ref 6–23)
CO2: 29 meq/L (ref 19–32)
Calcium: 9.7 mg/dL (ref 8.4–10.5)
Chloride: 105 mEq/L (ref 96–112)
Creatinine, Ser: 1.1 mg/dL (ref 0.40–1.20)
GFR: 54.31 mL/min — AB (ref >60.00)
GLUCOSE: 112 mg/dL — AB (ref 70–99)
POTASSIUM: 4.6 meq/L (ref 3.5–5.1)
Sodium: 141 mEq/L (ref 135–145)
Total Bilirubin: 0.3 mg/dL (ref 0.2–1.2)
Total Protein: 6.9 g/dL (ref 6.0–8.3)

## 2016-10-06 LAB — T4, FREE: Free T4: 0.91 ng/dL (ref 0.60–1.60)

## 2016-10-06 LAB — LIPID PANEL
CHOL/HDL RATIO: 3
Cholesterol: 183 mg/dL (ref 0–200)
HDL: 59.8 mg/dL (ref >39.00)
LDL CALC: 84 mg/dL (ref 0–99)
NONHDL: 123.19
Triglycerides: 198 mg/dL — ABNORMAL HIGH (ref 0.0–149.0)
VLDL: 39.6 mg/dL (ref 0.0–40.0)

## 2016-10-06 LAB — TSH: TSH: 0.72 u[IU]/mL (ref 0.35–4.50)

## 2016-10-06 NOTE — Progress Notes (Signed)
Subjective:  By signing my name below, I, Samantha Raymond, attest that this documentation has been prepared under the direction and in the presence of Samantha Ray, MD. Electronically Signed: [name], [name]. 10/06/2016 , 9:10 AM .  Patient was seen in Room 11 .   Patient ID: Samantha Raymond, female    DOB: January 18, 1961, 56 y.o.   MRN: [id number] Chief Complaint  Patient presents with  . Follow-up    ankle injury   HPI Samantha Raymond is a 56 y.o. female Here for follow up of right ankle sprain and foot pain. Her injury was in early August, approximately Aug 4th. See details of injury at last visit. Xray without fracture; did have some high ankle symptoms. Minimal discomfort over navicula but without fracture on xray. Placed in camwalker with ROM out of walker is tolerated. OTC ibuprofen as needed. I discussed her symptoms with her 5 days ago. She was able to come out of the brace temporarily, but on brief exam did still have some proximal ankle tenderness and navicula was slightly tender. Return to camwalker for stability. Here for follow up.   Patient states her right foot feels tight when turning in, but not painful. She's noticed one pinpoint spot hurts when she touches and pushes on the inside of her foot. When walking, she doesn't feel the same pain. She denies pain on the lateral aspect of her foot. She denies problems with walking or weight bearing. She only uses boot with ambulation outside of her house; doesn't wear boot at home. She's been doing stretching for home exercises. She's also still going to her work outs but hasn't been doing cardio to not put more stress on her foot.   Patient Active Problem List   Diagnosis Date Noted  . History of heat stroke 10/09/2014  . Family history of breast cancer in first degree relative 10/08/2014  . Fatty liver disease, nonalcoholic 67/89/3810  . 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 Unspecified vitamin D deficiency 08/24/2012  .  Visit for preventive health examination 08/20/2012  . Menopause 08/17/2012  . Obesity (BMI 30-39.9) 08/09/2011  . Hypothyroidism 08/09/2011  . Recurrent urinary tract infection   . Neutropenia ([place]) 08/07/2011   Past Medical History:  Diagnosis Date  . Asthma   . hypothyroid   . Recurrent urinary tract infection    Past Surgical History:  Procedure Laterality Date  . CESAREAN SECTION     Allergies  Allergen Reactions  . Sulfa Antibiotics Nausea And Vomiting   Prior to Admission medications   Medication Sig Start Date End Date Taking? Authorizing Provider  albuterol (PROVENTIL HFA;VENTOLIN HFA) 108 (90 Base) MCG/ACT inhaler Inhale 2 puffs into the lungs every 6 (six) hours as needed for wheezing or shortness of breath. 01/24/16   Forrest Moron, MD  albuterol (PROVENTIL) (2.5 MG/3ML) 0.083% nebulizer solution Take 3 mLs (2.5 mg total) by nebulization every 6 (six) hours as needed for wheezing or shortness of breath. 04/08/15   Shawnee Knapp, MD  Calcium-Vitamin D-Vitamin K (VIACTIV PO) Take by mouth.    [provider]  Cholecalciferol (VITAMIN D3) 1000 UNITS CAPS Take 1 capsule by mouth daily.    [provider]  escitalopram (LEXAPRO) 10 MG tablet TAKE 1 TABLET BY MOUTH DAILY. 03/20/16   Crecencio Mc, MD  levothyroxine (SYNTHROID, LEVOTHROID) 25 MCG tablet Take 1 tablet (25 mcg total) by mouth daily before breakfast. 02/28/16   Crecencio Mc, MD  Multiple Vitamin (MULTIVITAMIN) tablet  Take 1 tablet by mouth daily.    [provider]  phentermine (ADIPEX-P) 37.5 MG tablet 1/2 tablet in the am and early afternoon 10/05/16   Crecencio Mc, MD  triamcinolone (NASACORT) 55 MCG/ACT AERO nasal inhaler Place 2 sprays into the nose daily.    [provider]   Social History   Social History  . Marital status: Married    Spouse name: N/A  . Number of children: N/A  . Years of education: N/A   Occupational History  . Not on file.   Social History  Main Topics  . Smoking status: Never Smoker  . Smokeless tobacco: Never Used  . Alcohol use 0.0 oz/week  . Drug use: No  . Sexual activity: Not on file   Other Topics Concern  . Not on file   Social History Narrative  . No narrative on file   Review of Systems  Constitutional: Negative for chills, fatigue, fever and unexpected weight change.  Respiratory: Negative for cough.   Gastrointestinal: Negative for constipation, diarrhea, nausea and vomiting.  Musculoskeletal: Positive for arthralgias. Negative for gait problem, joint swelling and myalgias.  Skin: Negative for rash and wound.  Neurological: Negative for dizziness, weakness, numbness and headaches.       Objective:   Physical Exam  Constitutional: She is oriented to person, place, and time. She appears well-developed and well-nourished. No distress.  HENT:  Head: Normocephalic and atraumatic.  Eyes: Pupils are equal, round, and reactive to light. EOM are normal.  Neck: Neck supple.  Cardiovascular: Normal rate.   Pulmonary/Chest: Effort normal. No respiratory distress.  Musculoskeletal: Normal range of motion.  Right foot: proximal fibula non tender, tibfib squeeze is negative, fibula including distal fibula non tender; tender on anterior of malleolus only, focally tender over the navicula; 5th metatarsal and remaining metatarsals non tender, no apparent laxity with drawer test, talar tilt is negative, negative kleiger, minimal discomfort over lateral ankle ATF, no bony tenderness  Neurological: She is alert and oriented to person, place, and time.  Skin: Skin is warm and dry.  Psychiatric: She has a normal mood and affect. Her behavior is normal.  Nursing note and vitals reviewed.   Vitals:   10/06/16 0855  BP: 122/82  Pulse: 69  Resp: 16  Temp: 98.2 F (36.8 C)  TempSrc: Oral  SpO2: 99%  Height: 5\' 3"  (1.6 m)   Dg Foot Complete Right  Result Date: 10/06/2016 CLINICAL DATA:  Persistent navicular  tenderness after injury 09/19/2016. EXAM: RIGHT FOOT COMPLETE - 3+ VIEW COMPARISON:  09/22/2016 right foot radiographs FINDINGS: No fracture or dislocation. 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 No suspicious focal osseous lesions. Stable small plantar right calcaneal spur. No radiopaque foreign body. IMPRESSION: No right foot fracture or malalignment. Electronically Signed   By: Ilona Sorrel M.D.   On: 10/06/2016 09:44      Assessment & Plan:   Samantha Raymond is a 56 y.o. female Right foot pain - Plan: DG Foot Complete Right  Sprain of right ankle, unspecified ligament, subsequent encounter  Persistent right navicular tenderness without apparent soft tissue swelling or deformity on exam. Minimal ATF/lateral tenderness, no bony tenderness of ankle.  -x-Raymond without signs of fracture, discussed options with patient. Could check MRI given persistent navicular tenderness now, but with negative x-Raymond at this time, would be less likely. She could also wear the Cam Walker for another week at the most 2 weeks and if still having discomfort that  time would consult orthopedics or obtain MRI. She will let me know what she decides.  No orders of the defined types were placed in this encounter.  Patient Instructions   Due to the persistent pain over your navicular, I think it is important to rule out navicular injury with more advanced imaging as x-Raymond can sometimes miss that fracture. I will order either a CT or MRI and will let you know about the timing for that test. For now continue the Cam Walker, range of motion as tolerated only. Let me know if there are questions in the meantime.    IF you received an x-Raymond today, you will receive an invoice from [facility]. Please contact [facility] at [phone] with questions or concerns regarding your invoice.   IF you received labwork today, you will receive an invoice from [place]. Please contact [facility]  at [phone] with questions or concerns regarding your invoice.   Our billing staff will not be able to assist you with questions regarding bills from these companies.  You will be contacted with the lab results as soon as they are available. The fastest way to get your results is to activate your My Chart account. Instructions are located on the last page of this paperwork. If you have not heard from [place] regarding the results in 2 weeks, please contact this office.       I personally performed the services described in this documentation, which was scribed in my presence. The recorded information has been reviewed and considered for accuracy and completeness, addended by me as needed, and agree with information above.  Signed,   Samantha Ray, MD [facility] [place].  10/06/16 7:19 PM

## 2016-10-06 NOTE — Assessment & Plan Note (Addendum)
A total of 30 minutes was spent in face to face time with patient,  more than half of which was spent in counseling patient on the to balance work with health priorities.  , reviewing diet and exercise regimen,   explaining recent labs and previous imaging studies done, and coordination of care.  Phentermine refilled for 3 months

## 2016-10-06 NOTE — Patient Instructions (Addendum)
Due to the persistent pain over your navicular, I think it is important to rule out navicular injury with more advanced imaging as x-ray can sometimes miss that fracture. I will order either a CT or MRI and will let you know about the timing for that test. For now continue the Cam Walker, range of motion as tolerated only. Let me know if there are questions in the meantime.    IF you received an x-ray today, you will receive an invoice from [facility]. Please contact [facility] at [phone] with questions or concerns regarding your invoice.   IF you received labwork today, you will receive an invoice from [facility]. Please contact [facility] at [phone] with questions or concerns regarding your invoice.   Our billing staff will not be able to assist you with questions regarding bills from these companies.  You will be contacted with the lab results as soon as they are available. The fastest way to get your results is to activate your My Chart account. Instructions are located on the last page of this paperwork. If you have not heard from [place] regarding the results in 2 weeks, please contact this office.

## 2016-10-07 ENCOUNTER — Encounter: Payer: Self-pay | Admitting: Internal Medicine

## 2016-10-19 ENCOUNTER — Encounter: Payer: Self-pay | Admitting: Internal Medicine

## 2016-10-20 MED ORDER — ESCITALOPRAM OXALATE 10 MG PO TABS: 10.0000 mg | ORAL_TABLET | Freq: Every day | ORAL | 1 refills | Status: DC

## 2016-10-20 MED FILL — ESCITALOPRAM 10 MG TABLET: 10 | 90 days supply | Qty: 90 | Fill #0

## 2016-10-27 ENCOUNTER — Ambulatory Visit (INDEPENDENT_AMBULATORY_CARE_PROVIDER_SITE_OTHER): Payer: 59 | Admitting: Internal Medicine

## 2016-10-27 ENCOUNTER — Encounter: Payer: Self-pay | Admitting: Internal Medicine

## 2016-10-27 ENCOUNTER — Other Ambulatory Visit (HOSPITAL_COMMUNITY)
Admission: RE | Admit: 2016-10-27 | Discharge: 2016-10-27 | Disposition: A | Discharge status: Home / Self Care | Payer: 59 | Source: Ambulatory Visit | Attending: Internal Medicine | Admitting: Internal Medicine

## 2016-10-27 VITALS — BP 136/80 | HR 71 | Temp 98.3°F | Resp 16 | Ht 63.0 in | Wt 196.0 lb

## 2016-10-27 DIAGNOSIS — Z124 Encounter for screening for malignant neoplasm of cervix: Secondary | ICD-10-CM

## 2016-10-27 DIAGNOSIS — E034 Atrophy of thyroid (acquired): Secondary | ICD-10-CM | POA: Diagnosis not present

## 2016-10-27 DIAGNOSIS — Z0000 Encounter for general adult medical examination without abnormal findings: Secondary | ICD-10-CM | POA: Diagnosis not present

## 2016-10-27 DIAGNOSIS — E669 Obesity, unspecified: Secondary | ICD-10-CM | POA: Diagnosis not present

## 2016-10-27 NOTE — Patient Instructions (Signed)
Health Maintenance for Postmenopausal Women Menopause is a normal process in which your reproductive ability comes to an end. This process happens gradually over a span of months to years, usually between the ages of 22 and 9. Menopause is complete when you have missed 12 consecutive menstrual periods. It is important to talk with your health care provider about some of the most common conditions that affect postmenopausal women, such as heart disease, cancer, and bone loss (osteoporosis). Adopting a healthy lifestyle and getting preventive care can help to promote your health and wellness. Those actions can also lower your chances of developing some of these common conditions. What should I know about menopause? During menopause, you may experience a number of symptoms, such as:  Moderate-to-severe hot flashes.  Night sweats.  Decrease in sex drive.  Mood swings.  Headaches.  Tiredness.  Irritability.  Memory problems.  Insomnia.  Choosing to treat or not to treat menopausal changes is an individual decision that you make with your health care provider. What should I know about hormone replacement therapy and supplements? Hormone therapy products are effective for treating symptoms that are associated with menopause, such as hot flashes and night sweats. Hormone replacement carries certain risks, especially as you become older. If you are thinking about using estrogen or estrogen with progestin treatments, discuss the benefits and risks with your health care provider. What should I know about heart disease and stroke? Heart disease, heart attack, and stroke become more likely as you age. This may be due, in part, to the hormonal changes that your body experiences during menopause. These can affect how your body processes dietary fats, triglycerides, and cholesterol. Heart attack and stroke are both medical emergencies. There are many things that you can do to help prevent heart disease  and stroke:  Have your blood pressure checked at least every 1-2 years. High blood pressure causes heart disease and increases the risk of stroke.  If you are 53-22 years old, ask your health care provider if you should take aspirin to prevent a heart attack or a stroke.  Do not use any tobacco products, including cigarettes, chewing tobacco, or electronic cigarettes. If you need help quitting, ask your health care provider.  It is important to eat a healthy diet and maintain a healthy weight. ? Be sure to include plenty of vegetables, fruits, low-fat dairy products, and lean protein. ? Avoid eating foods that are high in solid fats, added sugars, or salt (sodium).  Get regular exercise. This is one of the most important things that you can do for your health. ? Try to exercise for at least 150 minutes each week. The type of exercise that you do should increase your heart rate and make you sweat. This is known as moderate-intensity exercise. ? Try to do strengthening exercises at least twice each week. Do these in addition to the moderate-intensity exercise.  Know your numbers.Ask your health care provider to check your cholesterol and your blood glucose. Continue to have your blood tested as directed by your health care provider.  What should I know about cancer screening? There are several types of cancer. Take the following steps to reduce your risk and to catch any cancer development as early as possible. Breast Cancer  Practice breast self-awareness. ? This means understanding how your breasts normally appear and feel. ? It also means doing regular breast self-exams. Let your health care provider know about any changes, no matter how small.  If you are 40  or older, have a clinician do a breast exam (clinical breast exam or CBE) every year. Depending on your age, family history, and medical history, it may be recommended that you also have a yearly breast X-ray (mammogram).  If you  have a family history of breast cancer, talk with your health care provider about genetic screening.  If you are at high risk for breast cancer, talk with your health care provider about having an MRI and a mammogram every year.  Breast cancer (BRCA) gene test is recommended for women who have family members with BRCA-related cancers. Results of the assessment will determine the need for genetic counseling and BRCA1 and for BRCA2 testing. BRCA-related cancers include these types: ? Breast. This occurs in males or females. ? Ovarian. ? Tubal. This may also be called fallopian tube cancer. ? Cancer of the abdominal or pelvic lining (peritoneal cancer). ? Prostate. ? Pancreatic.  Cervical, Uterine, and Ovarian Cancer Your health care provider may recommend that you be screened regularly for cancer of the pelvic organs. These include your ovaries, uterus, and vagina. This screening involves a pelvic exam, which includes checking for microscopic changes to the surface of your cervix (Pap test).  For women ages 21-65, health care providers may recommend a pelvic exam and a Pap test every three years. For women ages 79-65, they may recommend the Pap test and pelvic exam, combined with testing for human papilloma virus (HPV), every five years. Some types of HPV increase your risk of cervical cancer. Testing for HPV may also be done on women of any age who have unclear Pap test results.  Other health care providers may not recommend any screening for nonpregnant women who are considered low risk for pelvic cancer and have no symptoms. Ask your health care provider if a screening pelvic exam is right for you.  If you have had past treatment for cervical cancer or a condition that could lead to cancer, you need Pap tests and screening for cancer for at least 20 years after your treatment. If Pap tests have been discontinued for you, your risk factors (such as having a new sexual partner) need to be  reassessed to determine if you should start having screenings again. Some women have medical problems that increase the chance of getting cervical cancer. In these cases, your health care provider may recommend that you have screening and Pap tests more often.  If you have a family history of uterine cancer or ovarian cancer, talk with your health care provider about genetic screening.  If you have vaginal bleeding after reaching menopause, tell your health care provider.  There are currently no reliable tests available to screen for ovarian cancer.  Lung Cancer Lung cancer screening is recommended for adults 69-62 years old who are at high risk for lung cancer because of a history of smoking. A yearly low-dose CT scan of the lungs is recommended if you:  Currently smoke.  Have a history of at least 30 pack-years of smoking and you currently smoke or have quit within the past 15 years. A pack-year is smoking an average of one pack of cigarettes per day for one year.  Yearly screening should:  Continue until it has been 15 years since you quit.  Stop if you develop a health problem that would prevent you from having lung cancer treatment.  Colorectal Cancer  This type of cancer can be detected and can often be prevented.  Routine colorectal cancer screening usually begins at  age 42 and continues through age 45.  If you have risk factors for colon cancer, your health care provider may recommend that you be screened at an earlier age.  If you have a family history of colorectal cancer, talk with your health care provider about genetic screening.  Your health care provider may also recommend using home test kits to check for hidden blood in your stool.  A small camera at the end of a tube can be used to examine your colon directly (sigmoidoscopy or colonoscopy). This is done to check for the earliest forms of colorectal cancer.  Direct examination of the colon should be repeated every  5-10 years until age 71. However, if early forms of precancerous polyps or small growths are found or if you have a family history or genetic risk for colorectal cancer, you may need to be screened more often.  Skin Cancer  Check your skin from head to toe regularly.  Monitor any moles. Be sure to tell your health care provider: ? About any new moles or changes in moles, especially if there is a change in a mole's shape or color. ? If you have a mole that is larger than the size of a pencil eraser.  If any of your family members has a history of skin cancer, especially at a young age, talk with your health care provider about genetic screening.  Always use sunscreen. Apply sunscreen liberally and repeatedly throughout the day.  Whenever you are outside, protect yourself by wearing long sleeves, pants, a wide-brimmed hat, and sunglasses.  What should I know about osteoporosis? Osteoporosis is a condition in which bone destruction happens more quickly than new bone creation. After menopause, you may be at an increased risk for osteoporosis. To help prevent osteoporosis or the bone fractures that can happen because of osteoporosis, the following is recommended:  If you are 46-71 years old, get at least 1,000 mg of calcium and at least 600 mg of vitamin D per day.  If you are older than age 55 but younger than age 65, get at least 1,200 mg of calcium and at least 600 mg of vitamin D per day.  If you are older than age 54, get at least 1,200 mg of calcium and at least 800 mg of vitamin D per day.  Smoking and excessive alcohol intake increase the risk of osteoporosis. Eat foods that are rich in calcium and vitamin D, and do weight-bearing exercises several times each week as directed by your health care provider. What should I know about how menopause affects my mental health? Depression may occur at any age, but it is more common as you become older. Common symptoms of depression  include:  Low or sad mood.  Changes in sleep patterns.  Changes in appetite or eating patterns.  Feeling an overall lack of motivation or enjoyment of activities that you previously enjoyed.  Frequent crying spells.  Talk with your health care provider if you think that you are experiencing depression. What should I know about immunizations? It is important that you get and maintain your immunizations. These include:  Tetanus, diphtheria, and pertussis (Tdap) booster vaccine.  Influenza every year before the flu season begins.  Pneumonia vaccine.  Shingles vaccine.  Your health care provider may also recommend other immunizations. This information is not intended to replace advice given to you by your health care provider. Make sure you discuss any questions you have with your health care provider. Document Released: 03/27/2005  Document Revised: 08/23/2015 Document Reviewed: 11/06/2014 Elsevier Interactive Patient Education  2018 [employer] Inc.  

## 2016-10-27 NOTE — Progress Notes (Signed)
Patient ID: Samantha Raymond, female    DOB: [birth year]  Age: 56 y.o. MRN: [id number]  The patient is here for annual physical  examination and management of other chronic and acute problems.    3d mammogram normal March 2018 PAP 2015  Colonoscopy  Normal 2012   The risk factors are reflected in the social history.  The roster of all physicians providing medical care to patient - is listed in the Snapshot section of the chart.  Activities of daily living:  The patient is 100% independent in all ADLs: dressing, toileting, feeding as well as independent mobility  Home safety : The patient has smoke detectors in the home. They wear seatbelts.  There are no firearms at home. There is no violence in the home.   There is no risks for hepatitis, STDs or HIV. There is no   history of blood transfusion. They have no travel history to infectious disease endemic areas of the world.  The patient has seen their dentist in the last six month. They have seen their eye doctor in the last year.    Discussed the need for sun protection: hats, long sleeves and use of sunscreen if there is significant sun exposure.   Diet: the importance of a healthy diet is discussed. They do have a healthy diet.  The benefits of regular aerobic exercise were discussed. She is working out 3 days per week 60 minutes .   Depression screen: there are no signs or vegative symptoms of depression- irritability, change in appetite, anhedonia, sadness/tearfullness.   The following portions of the patient's history were reviewed and updated as appropriate: allergies, current medications, past family history, past medical history,  past surgical history, past social history  and problem list.  Visual acuity was not assessed per patient preference since she has regular follow up with her ophthalmologist. Hearing and body mass index were assessed and reviewed.   During the course of the visit the patient was educated and  counseled about appropriate screening and preventive services including : fall prevention , diabetes screening, nutrition counseling, colorectal cancer screening, and recommended immunizations.    CC: The primary encounter diagnosis was Screening for cervical cancer. Diagnoses of Visit for preventive health examination, Obesity (BMI 30-39.9), and Hypothyroidism due to acquired atrophy of thyroid were also pertinent to this visit.  Obesity:  Resume phentermine trial  Last month ,  Weight is  196 lbs (down 11 lbs in the past 3 weeks)  She is working out 3 days per week with cardo and weights and using an app called Noom to guide her caloric intake.  sticking to low GI diet   History [name] has a past medical history of Asthma; hypothyroid; and Recurrent urinary tract infection.   She has a past surgical history that includes Cesarean section.   Her family history includes Arthritis in her sister; Asthma in her brother and mother; Cancer in her maternal aunt; Cancer (age of onset: 29) in her sister; Diabetes in her father; Heart disease in her father; Hyperlipidemia in her father; Stroke in her maternal aunt.She reports that she has never smoked. She has never used smokeless tobacco. She reports that she drinks alcohol. She reports that she does not use drugs.  Outpatient Medications Prior to Visit  Medication Sig Dispense Refill  . albuterol (PROVENTIL HFA;VENTOLIN HFA) 108 (90 Base) MCG/ACT inhaler Inhale 2 puffs into the lungs every 6 (six) hours as needed for wheezing or shortness of breath. 1 Inhaler 3  .  albuterol (PROVENTIL) (2.5 MG/3ML) 0.083% nebulizer solution Take 3 mLs (2.5 mg total) by nebulization every 6 (six) hours as needed for wheezing or shortness of breath. 150 mL 1  . Calcium-Vitamin D-Vitamin K (VIACTIV PO) Take by mouth.    . Cholecalciferol (VITAMIN D3) 1000 UNITS CAPS Take 1 capsule by mouth daily.    Marland Kitchen escitalopram (LEXAPRO) 10 MG tablet Take 1 tablet (10 mg total) by mouth  daily. 90 tablet 1  . levothyroxine (SYNTHROID, LEVOTHROID) 25 MCG tablet Take 1 tablet (25 mcg total) by mouth daily before breakfast. 90 tablet 0  . Multiple Vitamin (MULTIVITAMIN) tablet Take 1 tablet by mouth daily.    . phentermine (ADIPEX-P) 37.5 MG tablet 1/2 tablet in the am and early afternoon 30 tablet 2  . triamcinolone (NASACORT) 55 MCG/ACT AERO nasal inhaler Place 2 sprays into the nose daily.     No facility-administered medications prior to visit.     Review of Systems   Patient denies headache, fevers, malaise, unintentional weight loss, skin rash, eye pain, sinus congestion and sinus pain, sore throat, dysphagia,  hemoptysis , cough, dyspnea, wheezing, chest pain, palpitations, orthopnea, edema, abdominal pain, nausea, melena, diarrhea, constipation, flank pain, dysuria, hematuria, urinary  Frequency, nocturia, numbness, tingling, seizures,  Focal weakness, Loss of consciousness,  Tremor, insomnia, depression, anxiety, and suicidal ideation.      Objective:  BP 136/80 (BP Location: Left Arm, Patient Position: Sitting, Cuff Size: Normal)   Pulse 71   Temp 98.3 F (36.8 C) (Oral)   Resp 16   Ht 5\' 3"  (1.6 m)   Wt 196 lb (88.9 kg)   SpO2 96%   BMI 34.72 kg/m   Physical Exam   General Appearance:    Alert, cooperative, no distress, appears stated age  Head:    Normocephalic, without obvious abnormality, atraumatic  Eyes:    PERRL, conjunctiva/corneas clear, EOM's intact, fundi    benign, both eyes  Ears:    Normal TM's and external ear canals, both ears  Nose:   Nares normal, septum midline, mucosa normal, no drainage    or sinus tenderness  Throat:   Lips, mucosa, and tongue normal; teeth and gums normal  Neck:   Supple, symmetrical, trachea midline, no adenopathy;    thyroid:  no enlargement/tenderness/nodules; no carotid   bruit or JVD  Back:     Symmetric, no curvature, ROM normal, no CVA tenderness  Lungs:     Clear to auscultation bilaterally, respirations  unlabored  Chest Wall:    No tenderness or deformity   Heart:    Regular rate and rhythm, S1 and S2 normal, no murmur, rub   or gallop  Breast Exam:    No tenderness, masses, or nipple abnormality  Abdomen:     Soft, non-tender, bowel sounds active all four quadrants,    no masses, no organomegaly  Genitalia:    Pelvic: cervix normal in appearance, external genitalia normal, no adnexal masses or tenderness, no cervical motion tenderness, rectovaginal septum normal, uterus normal size, shape, and consistency and vagina normal without discharge  Extremities:   Extremities normal, atraumatic, no cyanosis or edema  Pulses:   2+ and symmetric all extremities  Skin:   Skin color, texture, turgor normal, no rashes or lesions  Lymph nodes:   Cervical, supraclavicular, and axillary nodes normal  Neurologic:   CNII-XII intact, normal strength, sensation and reflexes    throughout       Assessment & Plan:   Problem List Items  Addressed This Visit    Hypothyroidism    Thyroid function is WNL on current dose.  No current changes needed.   Lab Results  Component Value Date   TSH 0.72 10/05/2016         Obesity (BMI 30-39.9)    She has lost 11 lbs in 3 weeks using phentermine,  Low GI diet and exercise. She has no side effects from the medication      Visit for preventive health examination    Annual comprehensive preventive exam was done as well as an evaluation and management of chronic conditions .  During the course of the visit the patient was educated and counseled about appropriate screening and preventive services including :  diabetes screening, lipid analysis with projected  10 year  risk for CAD , nutrition counseling, breast, cervical and colorectal cancer screening, and recommended immunizations.  Printed recommendations for health maintenance screenings was given       Other Visit Diagnoses    Screening for cervical cancer    -  Primary   Relevant Orders   Cytology - PAP     an additional 15 minutes of face to face time was spent with patient more than half of which was spent in counselling about the above mentioned conditions  and coordination of care   I am having Ms. Stevens maintain her Calcium-Vitamin D-Vitamin K (VIACTIV PO), multivitamin, Vitamin D3, triamcinolone, albuterol, albuterol, levothyroxine, phentermine, and escitalopram.  No orders of the defined types were placed in this encounter.   There are no discontinued medications.  Follow-up: No Follow-up on file.   Crecencio Mc, MD

## 2016-10-28 ENCOUNTER — Encounter: Payer: Self-pay | Admitting: Internal Medicine

## 2016-10-28 NOTE — Assessment & Plan Note (Signed)
Thyroid function is WNL on current dose.  No current changes needed.   Lab Results  Component Value Date   TSH 0.72 10/05/2016

## 2016-10-28 NOTE — Assessment & Plan Note (Signed)
She has lost 11 lbs in 3 weeks using phentermine,  Low GI diet and exercise. She has no side effects from the medication

## 2016-10-28 NOTE — Assessment & Plan Note (Signed)
Annual comprehensive preventive exam was done as well as an evaluation and management of chronic conditions .  During the course of the visit the patient was educated and counseled about appropriate screening and preventive services including :  diabetes screening, lipid analysis with projected  10 year  risk for CAD , nutrition counseling, breast, cervical and colorectal cancer screening, and recommended immunizations.  Printed recommendations for health maintenance screenings was given 

## 2016-10-29 ENCOUNTER — Encounter: Payer: Self-pay | Admitting: Internal Medicine

## 2016-10-29 LAB — CYTOLOGY - PAP
Diagnosis: NEGATIVE
HPV (WINDOPATH): NOT DETECTED

## 2016-11-11 MED FILL — PHENTERMINE 37.5 MG TABLET: 37.5 | 30 days supply | Qty: 30 | Fill #1

## 2016-11-11 NOTE — Telephone Encounter (Signed)
error 

## 2016-11-16 ENCOUNTER — Other Ambulatory Visit: Payer: Self-pay | Admitting: Family Medicine

## 2016-11-16 MED FILL — MONTELUKAST SOD 10 MG TAB: 10 | 90 days supply | Qty: 90 | Fill #0

## 2016-11-17 ENCOUNTER — Encounter: Payer: Self-pay | Admitting: Internal Medicine

## 2016-11-18 ENCOUNTER — Other Ambulatory Visit: Payer: Self-pay | Admitting: Internal Medicine

## 2016-11-18 MED ORDER — FLUTICASONE-SALMETEROL 500-50 MCG/DOSE IN AEPB: 1.0000 | INHALATION_SPRAY | Freq: Two times a day (BID) | RESPIRATORY_TRACT | 5 refills | Status: DC

## 2016-11-18 NOTE — Progress Notes (Signed)
adair

## 2016-12-23 MED FILL — PHENTERMINE 37.5 MG TABLET: 37.5 | 30 days supply | Qty: 30 | Fill #2

## 2016-12-28 ENCOUNTER — Telehealth: Payer: Self-pay

## 2016-12-28 MED ORDER — FLUTICASONE-SALMETEROL 500-50 MCG/DOSE IN AEPB: 1.0000 | INHALATION_SPRAY | Freq: Two times a day (BID) | RESPIRATORY_TRACT | 5 refills | Status: DC

## 2016-12-28 MED FILL — ADVAIR 500/50 DISKUS: 500-50 | 30 days supply | Qty: 60 | Fill #0

## 2016-12-28 NOTE — Telephone Encounter (Signed)
Refilled advair.

## 2017-02-03 ENCOUNTER — Other Ambulatory Visit: Payer: Self-pay | Admitting: Internal Medicine

## 2017-02-03 MED FILL — ESCITALOPRAM 10 MG TABLET: 10 | 90 days supply | Qty: 90 | Fill #1

## 2017-02-05 ENCOUNTER — Telehealth: Payer: Self-pay | Admitting: Internal Medicine

## 2017-02-05 NOTE — Telephone Encounter (Signed)
Okay to refill? 

## 2017-02-08 MED FILL — PHENTERMINE 37.5 MG TABLET: 37.5 | 30 days supply | Qty: 30 | Fill #0

## 2017-02-10 NOTE — Telephone Encounter (Signed)
Med rfilled

## 2017-02-20 IMAGING — CR DG CHEST 2V
2 series · 2 of 2 positions shown · non-contrast
Comparison: 01/09/2014.

CLINICAL DATA: Asthma.  Acute exacerbation .

EXAM:
CHEST  2 VIEW

[PA]
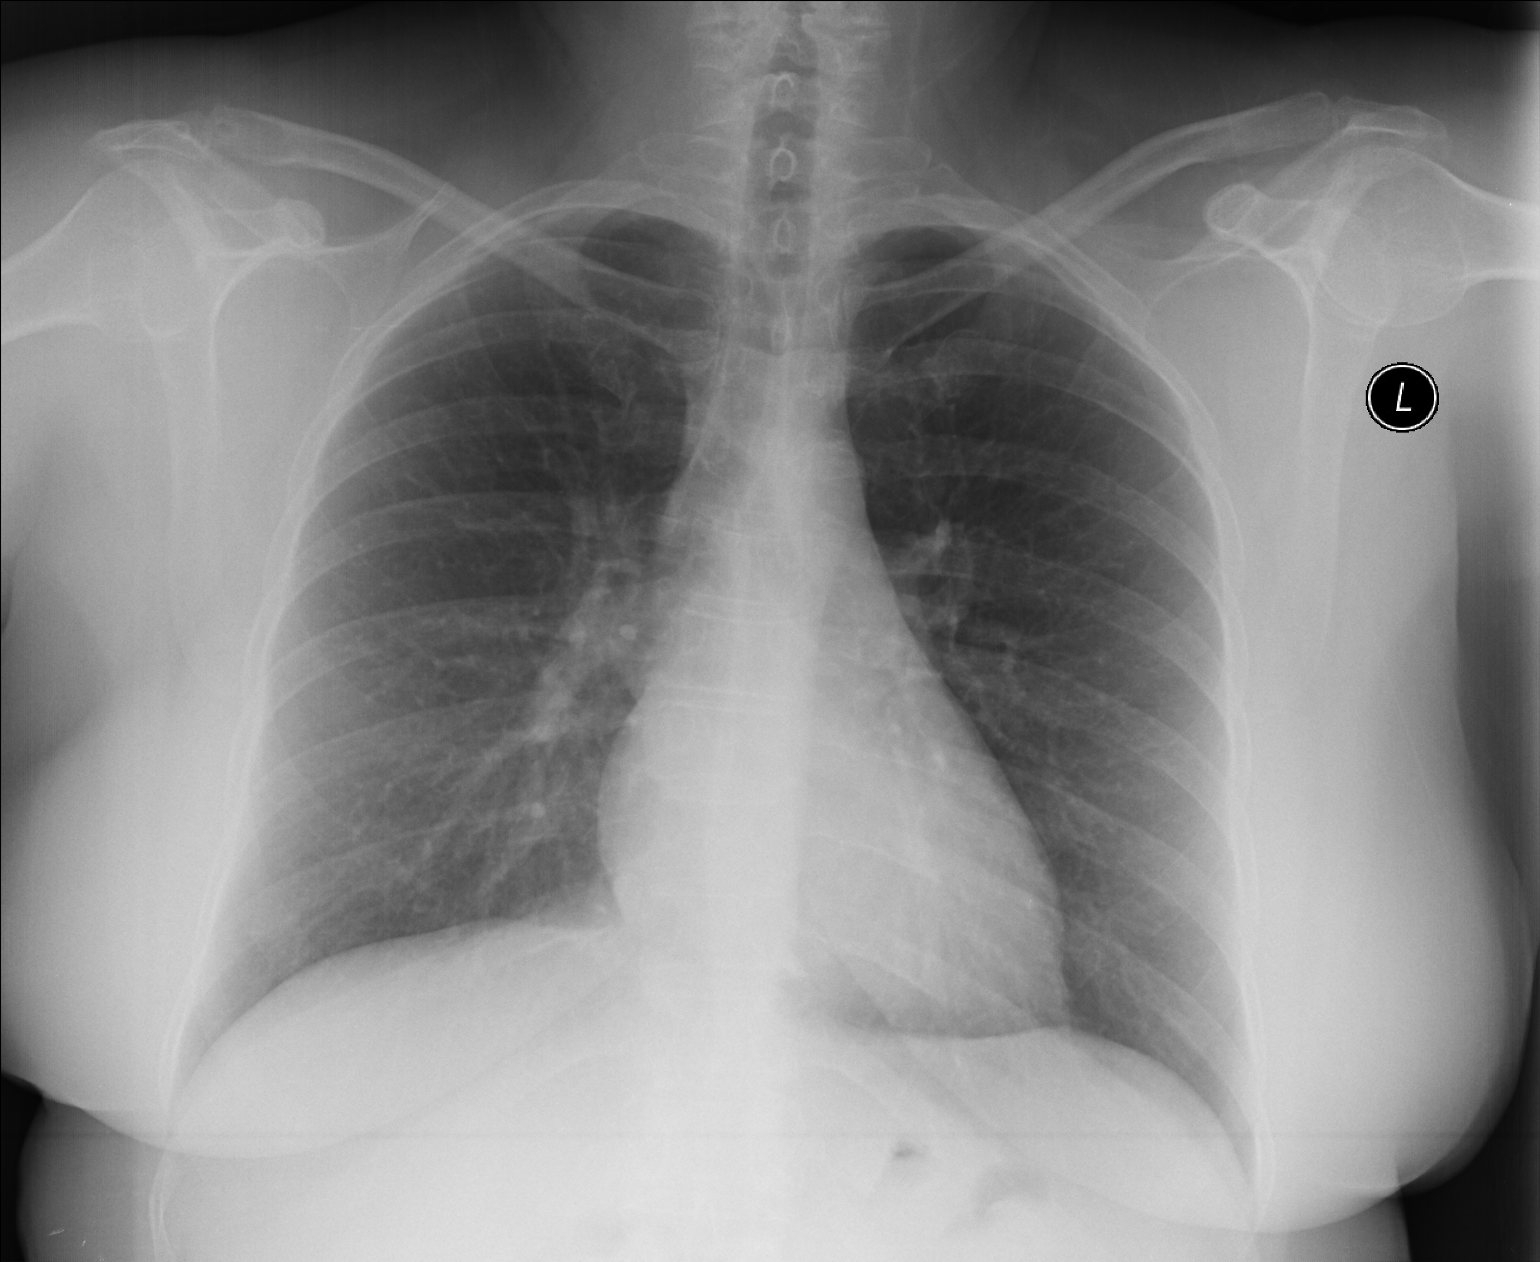

[lateral]
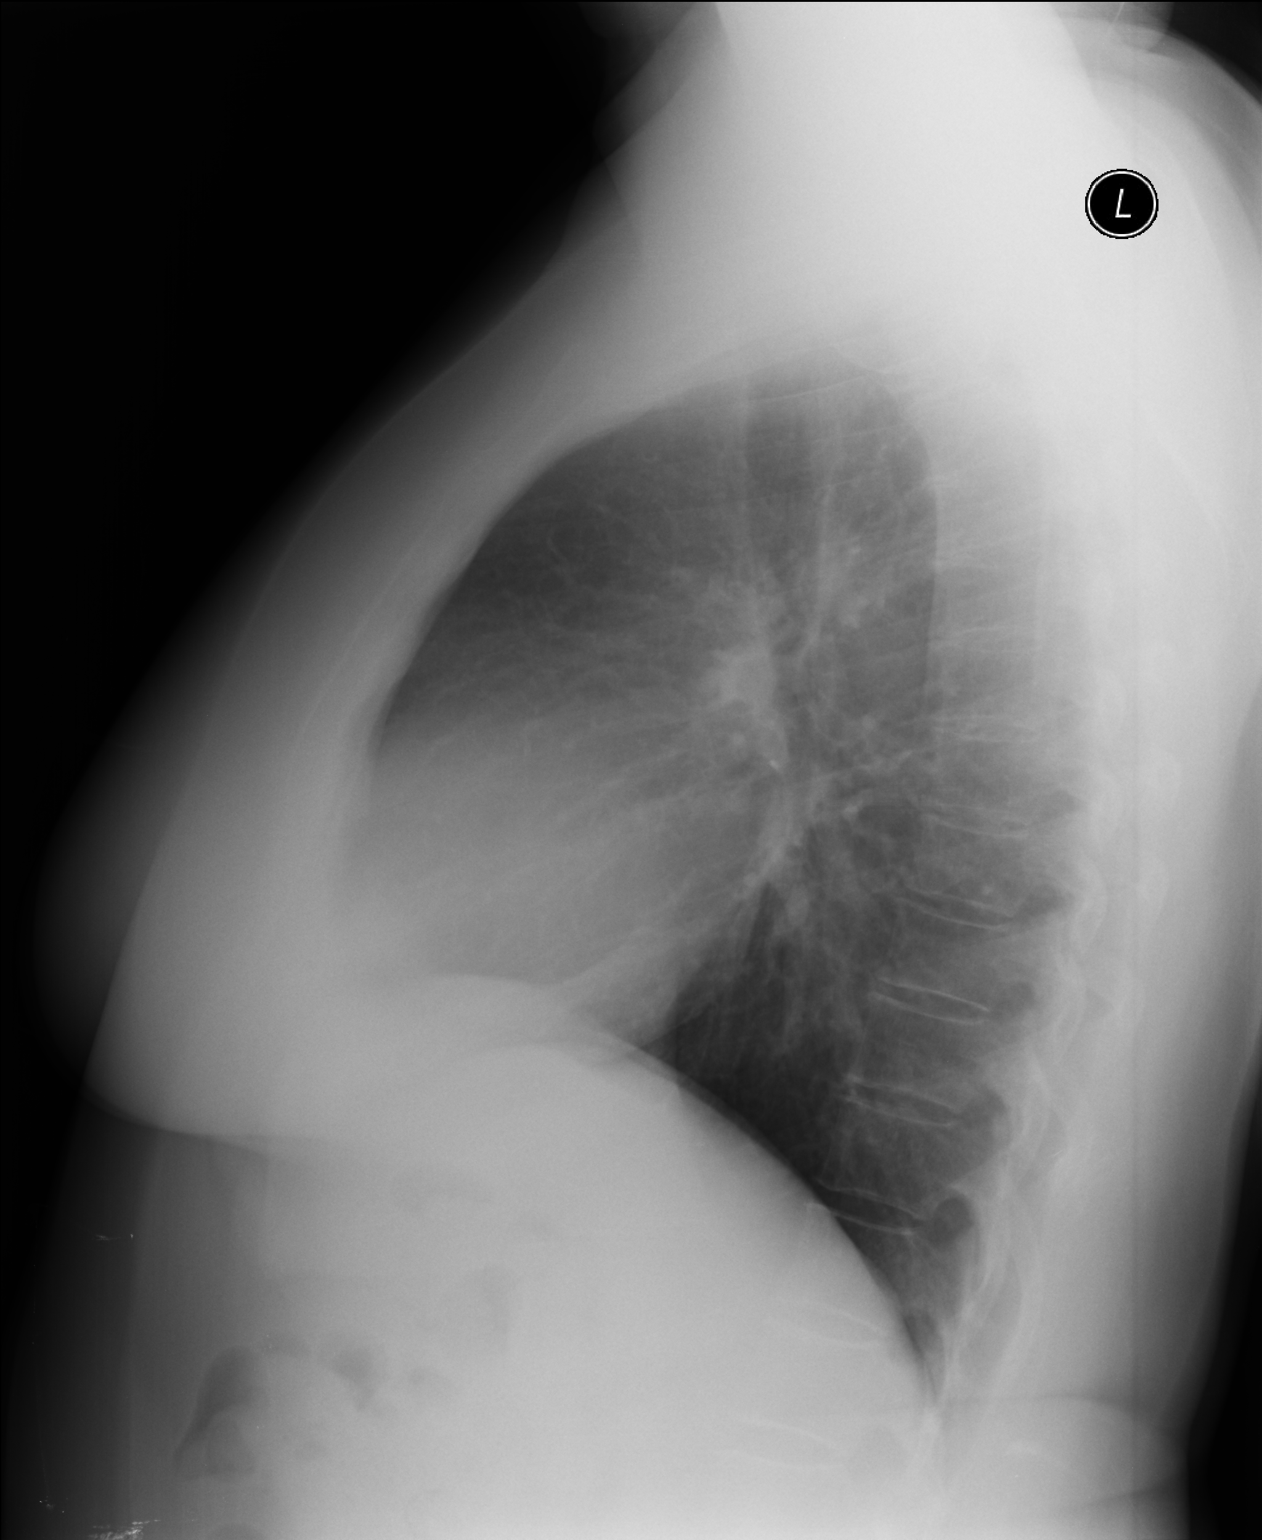

[2 of 2 positions shown; findings below may reference images not displayed]

FINDINGS: Mediastinum hilar structures normal. Lungs are clear. Heart size
normal. No pleural effusion or pneumothorax.
IMPRESSION: No acute cardiopulmonary disease.

## 2017-03-13 ENCOUNTER — Encounter: Payer: Self-pay | Admitting: Internal Medicine

## 2017-03-13 DIAGNOSIS — K760 Fatty (change of) liver, not elsewhere classified: Secondary | ICD-10-CM

## 2017-03-13 DIAGNOSIS — E034 Atrophy of thyroid (acquired): Secondary | ICD-10-CM

## 2017-03-13 DIAGNOSIS — R7301 Impaired fasting glucose: Secondary | ICD-10-CM

## 2017-03-16 NOTE — Telephone Encounter (Signed)
Lab Results  Component Value Date   TSH 0.72 10/05/2016

## 2017-03-16 NOTE — Telephone Encounter (Signed)
Refilled: 02/28/2016 Last OV: 10/27/2016 Next OV: not scheduled Last Labs: 10/05/2016

## 2017-03-19 ENCOUNTER — Ambulatory Visit: Payer: Self-pay

## 2017-03-19 ENCOUNTER — Other Ambulatory Visit: Payer: Self-pay | Admitting: *Deleted

## 2017-03-19 DIAGNOSIS — K760 Fatty (change of) liver, not elsewhere classified: Secondary | ICD-10-CM

## 2017-03-19 DIAGNOSIS — E034 Atrophy of thyroid (acquired): Secondary | ICD-10-CM

## 2017-03-19 DIAGNOSIS — R7301 Impaired fasting glucose: Secondary | ICD-10-CM

## 2017-03-20 LAB — COMPREHENSIVE METABOLIC PANEL
ALK PHOS: 88 IU/L (ref 39–117)
ALT: 21 IU/L (ref 0–32)
AST: 24 IU/L (ref 0–40)
Albumin/Globulin Ratio: 2.2 (ref 1.2–2.2)
Albumin: 4.6 g/dL (ref 3.5–5.5)
BUN/Creatinine Ratio: 10 (ref 9–23)
BUN: 8 mg/dL (ref 6–24)
Bilirubin Total: 0.3 mg/dL (ref 0.0–1.2)
CO2: 26 mmol/L (ref 20–29)
Calcium: 9.6 mg/dL (ref 8.7–10.2)
Chloride: 105 mmol/L (ref 96–106)
Creatinine, Ser: 0.81 mg/dL (ref 0.57–1.00)
GFR calc Af Amer: 93 mL/min/{1.73_m2} (ref >59)
GFR calc non Af Amer: 81 mL/min/{1.73_m2} (ref >59)
GLOBULIN, TOTAL: 2.1 g/dL (ref 1.5–4.5)
Glucose: 99 mg/dL (ref 65–99)
POTASSIUM: 4.2 mmol/L (ref 3.5–5.2)
SODIUM: 144 mmol/L (ref 134–144)
Total Protein: 6.7 g/dL (ref 6.0–8.5)

## 2017-03-20 LAB — HEMOGLOBIN A1C
ESTIMATED AVERAGE GLUCOSE: 120 mg/dL
Hgb A1c MFr Bld: 5.8 % — ABNORMAL HIGH (ref 4.8–5.6)

## 2017-03-20 LAB — TSH: TSH: 0.452 u[IU]/mL (ref 0.450–4.500)

## 2017-03-21 ENCOUNTER — Encounter: Payer: Self-pay | Admitting: Internal Medicine

## 2017-03-26 ENCOUNTER — Other Ambulatory Visit: Payer: Self-pay | Admitting: Internal Medicine

## 2017-03-26 MED FILL — LEVOTHYROXINE 25 MCG TABLET: 25 | 90 days supply | Qty: 90 | Fill #0

## 2017-04-01 ENCOUNTER — Encounter: Payer: Self-pay | Admitting: Internal Medicine

## 2017-04-01 NOTE — Telephone Encounter (Signed)
Printed form and filled out what I could placed the form in the red folder.

## 2017-04-06 ENCOUNTER — Encounter: Payer: Self-pay | Admitting: Physician Assistant

## 2017-04-06 ENCOUNTER — Other Ambulatory Visit: Payer: Self-pay

## 2017-04-06 ENCOUNTER — Ambulatory Visit (INDEPENDENT_AMBULATORY_CARE_PROVIDER_SITE_OTHER): Payer: Self-pay | Admitting: Physician Assistant

## 2017-04-06 VITALS — BP 110/68 | HR 80 | Temp 98.4°F | Resp 16 | Ht 63.0 in | Wt 191.6 lb

## 2017-04-06 DIAGNOSIS — R519 Headache, unspecified: Secondary | ICD-10-CM

## 2017-04-06 DIAGNOSIS — R110 Nausea: Secondary | ICD-10-CM

## 2017-04-06 DIAGNOSIS — R51 Headache: Secondary | ICD-10-CM

## 2017-04-06 MED ORDER — KETOROLAC TROMETHAMINE 60 MG/2ML IM SOLN
60.0000 mg | Freq: Once | INTRAMUSCULAR | Status: AC
  Administered 2017-04-06: 60 mg via INTRAMUSCULAR

## 2017-04-06 MED ORDER — ONDANSETRON 4 MG PO TBDP: 4.0000 mg | ORAL_TABLET | Freq: Three times a day (TID) | ORAL | 0 refills | Status: DC | PRN

## 2017-04-06 MED ORDER — ONDANSETRON 4 MG PO TBDP
4.0000 mg | ORAL_TABLET | Freq: Once | ORAL | Status: AC
  Administered 2017-04-06: 4 mg via ORAL

## 2017-04-06 MED FILL — ONDANSETRON ODT 4 MG TABLET: 4 | 6 days supply | Qty: 20 | Fill #0

## 2017-04-06 NOTE — Patient Instructions (Signed)
     IF you received an x-ray today, you will receive an invoice from [facility]. Please contact [facility] at [phone] with questions or concerns regarding your invoice.   IF you received labwork today, you will receive an invoice from [facility]. Please contact [facility] at [phone] with questions or concerns regarding your invoice.   Our billing staff will not be able to assist you with questions regarding bills from these companies.  You will be contacted with the lab results as soon as they are available. The fastest way to get your results is to activate your My Chart account. Instructions are located on the last page of this paperwork. If you have not heard from us regarding the results in 2 weeks, please contact this office.     

## 2017-04-06 NOTE — Progress Notes (Signed)
Samantha Raymond  MRN: [id number] DOB: Oct 01, 1960  PCP: Crecencio Mc, MD  Chief Complaint  Patient presents with  . office visit    Subjective:  Pt presents to clinic for headache that she has had for 3 days that is just not being relieved by anything.  The headache is in the back of her head and the base and into her left ear and behind it.  She is getting sensations of flushing that seems different than a hot flash  But when she gets these she gets slightly nauseated and a clammy feeling.  She has had some nausea and loose stools.  She does not feels sick but she also does not feel well.  Last night she went to bed really early which is odd for her and this am she forced herself to go to work. She has tried medications for the headaches and she has not really gotten relief.  She has h/o migraines but this feels different than her typical migraines which she has not had in a really long time.  She has no myalgias and no different congestion than her normal PND.  No photophobia or vision changes.  No vertigo.  Increase stress recently.  Does not typically get headaches.  History is obtained by patient.  Review of Systems  Constitutional: Positive for diaphoresis (with flushes). Negative for chills and fever.  HENT: Positive for ear pain (left). Negative for congestion and sinus pressure.   Respiratory: Negative for cough.   Gastrointestinal: Positive for nausea. Negative for diarrhea (loose more frequent stool).  Musculoskeletal: Negative for myalgias.  Neurological: Positive for headaches. Negative for dizziness.    Patient Active Problem List   Diagnosis Date Noted  . History of heat stroke 10/09/2014  . Family history of breast cancer in first degree relative 10/08/2014  . Fatty liver disease, nonalcoholic 08/09/7626  . 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 Unspecified vitamin D deficiency 08/24/2012  . Visit for preventive health examination 08/20/2012  . Menopause  08/17/2012  . Obesity (BMI 30-39.9) 08/09/2011  . Hypothyroidism 08/09/2011  . Recurrent urinary tract infection   . Neutropenia ([place]) 08/07/2011    Current Outpatient Medications on File Prior to Visit  Medication Sig Dispense Refill  . albuterol (PROVENTIL HFA;VENTOLIN HFA) 108 (90 Base) MCG/ACT inhaler Inhale 2 puffs into the lungs every 6 (six) hours as needed for wheezing or shortness of breath. 1 Inhaler 3  . albuterol (PROVENTIL) (2.5 MG/3ML) 0.083% nebulizer solution Take 3 mLs (2.5 mg total) by nebulization every 6 (six) hours as needed for wheezing or shortness of breath. 150 mL 1  . Calcium-Vitamin D-Vitamin K (VIACTIV PO) Take by mouth.    . Cholecalciferol (VITAMIN D3) 1000 UNITS CAPS Take 1 capsule by mouth daily.    Marland Kitchen escitalopram (LEXAPRO) 10 MG tablet Take 1 tablet (10 mg total) by mouth daily. 90 tablet 1  . Fluticasone-Salmeterol (ADVAIR DISKUS) 500-50 MCG/DOSE AEPB Inhale 1 puff 2 (two) times daily into the lungs. RINSE MOUTH AFTER EACH USE TO PREVENT THRUSH 60 each 5  . levothyroxine (SYNTHROID, LEVOTHROID) 25 MCG tablet TAKE 1 TABLET BY MOUTH DAILY BEFORE BREAKFAST. 90 tablet 1  . Multiple Vitamin (MULTIVITAMIN) tablet Take 1 tablet by mouth daily.    . phentermine (ADIPEX-P) 37.5 MG tablet TAKE ONE-HALF TABLET BY MOUTH IN THE MORNING AND EARLY AFTERNOON 30 tablet 0  . triamcinolone (NASACORT) 55 MCG/ACT AERO nasal inhaler Place 2 sprays into the nose daily.     No current  facility-administered medications on file prior to visit.     Allergies  Allergen Reactions  . Sulfa Antibiotics Nausea And Vomiting    Past Medical History:  Diagnosis Date  . Asthma   . hypothyroid   . Recurrent urinary tract infection    Social History   Social History Narrative  . Not on file   Social History   Tobacco Use  . Smoking status: Never Smoker  . Smokeless tobacco: Never Used  Substance Use Topics  . Alcohol use: Yes    Alcohol/week: 0.0 oz  . Drug use: No    family history includes Arthritis in her sister; Asthma in her brother and mother; Cancer in her maternal aunt; Cancer (age of onset: 76) in her sister; Diabetes in her father; Heart disease in her father; Hyperlipidemia in her father; Stroke in her maternal aunt.     Objective:  BP 110/68 (BP Location: Left Arm, Patient Position: Sitting, Cuff Size: Normal)   Pulse 80   Temp 98.4 F (36.9 C) (Oral)   Resp 16   Ht 5\' 3"  (1.6 m)   Wt 191 lb 9.6 oz (86.9 kg)   SpO2 98%   BMI 33.94 kg/m  Body mass index is 33.94 kg/m.  Physical Exam  Constitutional: She is oriented to person, place, and time and well-developed, well-nourished, and in no distress.  HENT:  Head: Normocephalic and atraumatic.    Right Ear: Hearing, tympanic membrane, external ear and ear canal normal.  Left Ear: Hearing, tympanic membrane, external ear and ear canal normal.  Nose: Nose normal.  Mouth/Throat: Uvula is midline, oropharynx is clear and moist and mucous membranes are normal.  Eyes: Conjunctivae and EOM are normal. Pupils are equal, round, and reactive to light.  Neck: Normal range of motion. Neck supple.  Cardiovascular: Normal rate, regular rhythm and normal heart sounds.  No murmur heard. Pulmonary/Chest: Effort normal and breath sounds normal.  Musculoskeletal:       Cervical back: She exhibits normal range of motion, no tenderness and no spasm.  Lymphadenopathy:       Head (right side): No tonsillar and no occipital adenopathy present.       Head (left side): No tonsillar and no occipital adenopathy present.    She has no cervical adenopathy.  Neurological: She is alert and oriented to person, place, and time. She has normal motor skills, normal sensation, normal strength, normal reflexes and intact cranial nerves. She displays no weakness and facial symmetry. She has a normal Straight Leg Raise Test and a normal Cerebellar Exam. Gait normal. Gait normal.  Skin: Skin is warm and dry.   Psychiatric: Mood, memory, affect and judgment normal.  Vitals reviewed.   Assessment and Plan :  Nausea without vomiting - Plan: ondansetron (ZOFRAN ODT) 4 MG disintegrating tablet, ondansetron (ZOFRAN-ODT) disintegrating tablet 4 mg  Nonintractable headache, unspecified chronicity pattern, unspecified headache type - Plan: ketorolac (TORADOL) injection 60 mg   Unsure if this is a migraine - possible tension migraine as the patient has been under more stress lately.  Also possible that patient has a virus but to late for flu testing/treatment.  She was given nausea mediations in case this was early GI illness - injection of toradol given for pain control.  Watch and wait for the left ear pain - f/u if no better.  Windell Hummingbird PA-C  [facility] 04/06/2017 5:50 PM

## 2017-04-09 ENCOUNTER — Ambulatory Visit: Payer: Self-pay | Admitting: Physician Assistant

## 2017-04-09 ENCOUNTER — Encounter: Payer: Self-pay | Admitting: Internal Medicine

## 2017-04-12 ENCOUNTER — Encounter: Payer: Self-pay | Admitting: Internal Medicine

## 2017-04-13 ENCOUNTER — Other Ambulatory Visit: Payer: Self-pay | Admitting: Internal Medicine

## 2017-05-14 NOTE — Progress Notes (Unsigned)
Lab visit only. 

## 2017-05-25 ENCOUNTER — Encounter: Payer: Self-pay | Admitting: Internal Medicine

## 2017-06-10 ENCOUNTER — Other Ambulatory Visit: Payer: Self-pay | Admitting: Internal Medicine

## 2017-06-10 MED FILL — ESCITALOPRAM 10 MG TABLET: 10 | 90 days supply | Qty: 90 | Fill #0

## 2017-06-14 ENCOUNTER — Encounter: Payer: Self-pay | Admitting: Internal Medicine

## 2017-06-15 MED ORDER — PHENTERMINE HCL 37.5 MG PO TABS: ORAL_TABLET | ORAL | 2 refills | Status: DC

## 2017-06-15 NOTE — Telephone Encounter (Signed)
Phentermine requested and refill given for 3 month trial  OV needed prior to refills.

## 2017-06-16 MED FILL — PHENTERMINE 37.5 MG TABLET: 37.5 | 30 days supply | Qty: 30 | Fill #0

## 2017-07-12 ENCOUNTER — Encounter: Payer: Self-pay | Admitting: Family Medicine

## 2017-07-22 MED FILL — PHENTERMINE 37.5 MG TABLET: 37.5 | 30 days supply | Qty: 30 | Fill #1

## 2017-09-23 MED FILL — ESCITALOPRAM 10 MG TABLET: 10 | 90 days supply | Qty: 90 | Fill #1

## 2017-09-23 MED FILL — PHENTERMINE 37.5 MG TABLET: 37.5 | 30 days supply | Qty: 30 | Fill #2

## 2017-10-22 ENCOUNTER — Other Ambulatory Visit: Payer: Self-pay | Admitting: Internal Medicine

## 2017-10-22 DIAGNOSIS — Z1231 Encounter for screening mammogram for malignant neoplasm of breast: Secondary | ICD-10-CM

## 2017-10-28 ENCOUNTER — Encounter: Payer: Self-pay | Admitting: Internal Medicine

## 2017-10-28 ENCOUNTER — Ambulatory Visit (INDEPENDENT_AMBULATORY_CARE_PROVIDER_SITE_OTHER): Payer: No Typology Code available for payment source | Admitting: Internal Medicine

## 2017-10-28 VITALS — BP 110/72 | HR 58 | Temp 98.4°F | Resp 14 | Ht 63.0 in | Wt 194.1 lb

## 2017-10-28 DIAGNOSIS — Z23 Encounter for immunization: Secondary | ICD-10-CM

## 2017-10-28 DIAGNOSIS — K760 Fatty (change of) liver, not elsewhere classified: Secondary | ICD-10-CM | POA: Diagnosis not present

## 2017-10-28 DIAGNOSIS — R7303 Prediabetes: Secondary | ICD-10-CM

## 2017-10-28 DIAGNOSIS — E034 Atrophy of thyroid (acquired): Secondary | ICD-10-CM

## 2017-10-28 DIAGNOSIS — Z0000 Encounter for general adult medical examination without abnormal findings: Secondary | ICD-10-CM

## 2017-10-28 DIAGNOSIS — E669 Obesity, unspecified: Secondary | ICD-10-CM | POA: Diagnosis not present

## 2017-10-28 NOTE — Patient Instructions (Signed)
Health Maintenance for Postmenopausal Women Menopause is a normal process in which your reproductive ability comes to an end. This process happens gradually over a span of months to years, usually between the ages of 22 and 9. Menopause is complete when you have missed 12 consecutive menstrual periods. It is important to talk with your health care provider about some of the most common conditions that affect postmenopausal women, such as heart disease, cancer, and bone loss (osteoporosis). Adopting a healthy lifestyle and getting preventive care can help to promote your health and wellness. Those actions can also lower your chances of developing some of these common conditions. What should I know about menopause? During menopause, you may experience a number of symptoms, such as:  Moderate-to-severe hot flashes.  Night sweats.  Decrease in sex drive.  Mood swings.  Headaches.  Tiredness.  Irritability.  Memory problems.  Insomnia.  Choosing to treat or not to treat menopausal changes is an individual decision that you make with your health care provider. What should I know about hormone replacement therapy and supplements? Hormone therapy products are effective for treating symptoms that are associated with menopause, such as hot flashes and night sweats. Hormone replacement carries certain risks, especially as you become older. If you are thinking about using estrogen or estrogen with progestin treatments, discuss the benefits and risks with your health care provider. What should I know about heart disease and stroke? Heart disease, heart attack, and stroke become more likely as you age. This may be due, in part, to the hormonal changes that your body experiences during menopause. These can affect how your body processes dietary fats, triglycerides, and cholesterol. Heart attack and stroke are both medical emergencies. There are many things that you can do to help prevent heart disease  and stroke:  Have your blood pressure checked at least every 1-2 years. High blood pressure causes heart disease and increases the risk of stroke.  If you are 53-22 years old, ask your health care provider if you should take aspirin to prevent a heart attack or a stroke.  Do not use any tobacco products, including cigarettes, chewing tobacco, or electronic cigarettes. If you need help quitting, ask your health care provider.  It is important to eat a healthy diet and maintain a healthy weight. ? Be sure to include plenty of vegetables, fruits, low-fat dairy products, and lean protein. ? Avoid eating foods that are high in solid fats, added sugars, or salt (sodium).  Get regular exercise. This is one of the most important things that you can do for your health. ? Try to exercise for at least 150 minutes each week. The type of exercise that you do should increase your heart rate and make you sweat. This is known as moderate-intensity exercise. ? Try to do strengthening exercises at least twice each week. Do these in addition to the moderate-intensity exercise.  Know your numbers.Ask your health care provider to check your cholesterol and your blood glucose. Continue to have your blood tested as directed by your health care provider.  What should I know about cancer screening? There are several types of cancer. Take the following steps to reduce your risk and to catch any cancer development as early as possible. Breast Cancer  Practice breast self-awareness. ? This means understanding how your breasts normally appear and feel. ? It also means doing regular breast self-exams. Let your health care provider know about any changes, no matter how small.  If you are 40  or older, have a clinician do a breast exam (clinical breast exam or CBE) every year. Depending on your age, family history, and medical history, it may be recommended that you also have a yearly breast X-ray (mammogram).  If you  have a family history of breast cancer, talk with your health care provider about genetic screening.  If you are at high risk for breast cancer, talk with your health care provider about having an MRI and a mammogram every year.  Breast cancer (BRCA) gene test is recommended for women who have family members with BRCA-related cancers. Results of the assessment will determine the need for genetic counseling and BRCA1 and for BRCA2 testing. BRCA-related cancers include these types: ? Breast. This occurs in males or females. ? Ovarian. ? Tubal. This may also be called fallopian tube cancer. ? Cancer of the abdominal or pelvic lining (peritoneal cancer). ? Prostate. ? Pancreatic.  Cervical, Uterine, and Ovarian Cancer Your health care provider may recommend that you be screened regularly for cancer of the pelvic organs. These include your ovaries, uterus, and vagina. This screening involves a pelvic exam, which includes checking for microscopic changes to the surface of your cervix (Pap test).  For women ages 21-65, health care providers may recommend a pelvic exam and a Pap test every three years. For women ages 79-65, they may recommend the Pap test and pelvic exam, combined with testing for human papilloma virus (HPV), every five years. Some types of HPV increase your risk of cervical cancer. Testing for HPV may also be done on women of any age who have unclear Pap test results.  Other health care providers may not recommend any screening for nonpregnant women who are considered low risk for pelvic cancer and have no symptoms. Ask your health care provider if a screening pelvic exam is right for you.  If you have had past treatment for cervical cancer or a condition that could lead to cancer, you need Pap tests and screening for cancer for at least 20 years after your treatment. If Pap tests have been discontinued for you, your risk factors (such as having a new sexual partner) need to be  reassessed to determine if you should start having screenings again. Some women have medical problems that increase the chance of getting cervical cancer. In these cases, your health care provider may recommend that you have screening and Pap tests more often.  If you have a family history of uterine cancer or ovarian cancer, talk with your health care provider about genetic screening.  If you have vaginal bleeding after reaching menopause, tell your health care provider.  There are currently no reliable tests available to screen for ovarian cancer.  Lung Cancer Lung cancer screening is recommended for adults 69-62 years old who are at high risk for lung cancer because of a history of smoking. A yearly low-dose CT scan of the lungs is recommended if you:  Currently smoke.  Have a history of at least 30 pack-years of smoking and you currently smoke or have quit within the past 15 years. A pack-year is smoking an average of one pack of cigarettes per day for one year.  Yearly screening should:  Continue until it has been 15 years since you quit.  Stop if you develop a health problem that would prevent you from having lung cancer treatment.  Colorectal Cancer  This type of cancer can be detected and can often be prevented.  Routine colorectal cancer screening usually begins at  age 42 and continues through age 45.  If you have risk factors for colon cancer, your health care provider may recommend that you be screened at an earlier age.  If you have a family history of colorectal cancer, talk with your health care provider about genetic screening.  Your health care provider may also recommend using home test kits to check for hidden blood in your stool.  A small camera at the end of a tube can be used to examine your colon directly (sigmoidoscopy or colonoscopy). This is done to check for the earliest forms of colorectal cancer.  Direct examination of the colon should be repeated every  5-10 years until age 71. However, if early forms of precancerous polyps or small growths are found or if you have a family history or genetic risk for colorectal cancer, you may need to be screened more often.  Skin Cancer  Check your skin from head to toe regularly.  Monitor any moles. Be sure to tell your health care provider: ? About any new moles or changes in moles, especially if there is a change in a mole's shape or color. ? If you have a mole that is larger than the size of a pencil eraser.  If any of your family members has a history of skin cancer, especially at a young age, talk with your health care provider about genetic screening.  Always use sunscreen. Apply sunscreen liberally and repeatedly throughout the day.  Whenever you are outside, protect yourself by wearing long sleeves, pants, a wide-brimmed hat, and sunglasses.  What should I know about osteoporosis? Osteoporosis is a condition in which bone destruction happens more quickly than new bone creation. After menopause, you may be at an increased risk for osteoporosis. To help prevent osteoporosis or the bone fractures that can happen because of osteoporosis, the following is recommended:  If you are 46-71 years old, get at least 1,000 mg of calcium and at least 600 mg of vitamin D per day.  If you are older than age 55 but younger than age 65, get at least 1,200 mg of calcium and at least 600 mg of vitamin D per day.  If you are older than age 54, get at least 1,200 mg of calcium and at least 800 mg of vitamin D per day.  Smoking and excessive alcohol intake increase the risk of osteoporosis. Eat foods that are rich in calcium and vitamin D, and do weight-bearing exercises several times each week as directed by your health care provider. What should I know about how menopause affects my mental health? Depression may occur at any age, but it is more common as you become older. Common symptoms of depression  include:  Low or sad mood.  Changes in sleep patterns.  Changes in appetite or eating patterns.  Feeling an overall lack of motivation or enjoyment of activities that you previously enjoyed.  Frequent crying spells.  Talk with your health care provider if you think that you are experiencing depression. What should I know about immunizations? It is important that you get and maintain your immunizations. These include:  Tetanus, diphtheria, and pertussis (Tdap) booster vaccine.  Influenza every year before the flu season begins.  Pneumonia vaccine.  Shingles vaccine.  Your health care provider may also recommend other immunizations. This information is not intended to replace advice given to you by your health care provider. Make sure you discuss any questions you have with your health care provider. Document Released: 03/27/2005  Document Revised: 08/23/2015 Document Reviewed: 11/06/2014 Elsevier Interactive Patient Education  2018 [employer] Inc.  

## 2017-10-28 NOTE — Progress Notes (Signed)
Patient ID: Samantha Raymond, female    DOB: 05/03/1960  Age: 57 y.o. MRN: [id number]  The patient is here for annual preventive examination and management of other chronic and acute problems.   The risk factors are reflected in the social history.  The roster of all physicians providing medical care to patient - is listed in the Snapshot section of the chart.  Activities of daily living:  The patient is 100% independent in all ADLs: dressing, toileting, feeding as well as independent mobilityHome safety : The patient has smoke detectors in the home. They wear seatbelts.    There are no firearms at home. There is no violence in the home.   There is no risks for hepatitis, STDs or HIV. There is no   history of blood transfusion. They have no travel history to infectious disease endemic areas of the world.  The patient has seen their dentist in the last six month. They have seen their eye doctor in the last year. They deny any  hearing difficulty .  They do not  have excessive sun exposure. Discussed the need for sun protection: hats, long sleeves and use of sunscreen if there is significant sun exposure.   Diet: the importance of a healthy diet is discussed. They do have a healthy diet.  The benefits of regular aerobic exercise were discussed. She  exercises  3 times per week ,  60 minutes.   Depression screen: there are no signs or vegative symptoms of depression- irritability, change in appetite, anhedonia, sadness/tearfullness.  The following portions of the patient's history were reviewed and updated as appropriate: allergies, current medications, past family history, past medical history,  past surgical history, past social history  and problem list.  Visual acuity was not assessed per patient preference since she has regular follow up with her ophthalmologist. Hearing and body mass index were assessed and reviewed.   During the course of the visit the patient was educated and counseled  about appropriate screening and preventive services including : fall prevention , diabetes screening, nutrition counseling, colorectal cancer screening, and recommended immunizations.    CC: The primary encounter diagnosis was Hypothyroidism due to acquired atrophy of thyroid. Diagnoses of Prediabetes, Encounter for preventive health examination, Need for influenza vaccination, Fatty liver disease, nonalcoholic, Visit for preventive health examination, and Obesity (BMI 30-39.9) were also pertinent to this visit.  History Samantha Raymond has a past medical history of Asthma, hypothyroid, and Recurrent urinary tract infection.   She has a past surgical history that includes Cesarean section.   Her family history includes Arthritis in her sister; Asthma in her brother and mother; Cancer in her maternal aunt; Cancer (age of onset: 38) in her sister; Diabetes in her father; Heart disease in her father; Hyperlipidemia in her father; Stroke in her maternal aunt.She reports that she has never smoked. She has never used smokeless tobacco. She reports that she drinks alcohol. She reports that she does not use drugs.  Outpatient Medications Prior to Visit  Medication Sig Dispense Refill  . albuterol (PROVENTIL HFA;VENTOLIN HFA) 108 (90 Base) MCG/ACT inhaler Inhale 2 puffs into the lungs every 6 (six) hours as needed for wheezing or shortness of breath. 1 Inhaler 3  . albuterol (PROVENTIL) (2.5 MG/3ML) 0.083% nebulizer solution Take 3 mLs (2.5 mg total) by nebulization every 6 (six) hours as needed for wheezing or shortness of breath. 150 mL 1  . Calcium-Vitamin D-Vitamin K (VIACTIV PO) Take by mouth.    . escitalopram (LEXAPRO)  10 MG tablet TAKE 1 TABLET (10 MG TOTAL) BY MOUTH DAILY. 90 tablet 1  . fluticasone (FLONASE) 50 MCG/ACT nasal spray Place 1 spray into both nostrils daily.    . Fluticasone-Salmeterol (ADVAIR DISKUS) 500-50 MCG/DOSE AEPB Inhale 1 puff 2 (two) times daily into the lungs. RINSE MOUTH AFTER EACH  USE TO PREVENT THRUSH 60 each 5  . levothyroxine (SYNTHROID, LEVOTHROID) 25 MCG tablet TAKE 1 TABLET BY MOUTH DAILY BEFORE BREAKFAST. 90 tablet 1  . Multiple Vitamin (MULTIVITAMIN) tablet Take 1 tablet by mouth daily.    . ondansetron (ZOFRAN ODT) 4 MG disintegrating tablet Take 1 tablet (4 mg total) by mouth every 8 (eight) hours as needed for nausea. 20 tablet 0  . phentermine (ADIPEX-P) 37.5 MG tablet TAKE ONE-HALF TABLET BY MOUTH IN THE MORNING AND EARLY AFTERNOON 30 tablet 2  . Cholecalciferol (VITAMIN D3) 1000 UNITS CAPS Take 1 capsule by mouth daily.    Marland Kitchen triamcinolone (NASACORT) 55 MCG/ACT AERO nasal inhaler Place 2 sprays into the nose daily.     No facility-administered medications prior to visit.     Review of Systems   Patient denies headache, fevers, malaise, unintentional weight loss, skin rash, eye pain, sinus congestion and sinus pain, sore throat, dysphagia,  hemoptysis , cough, dyspnea, wheezing, chest pain, palpitations, orthopnea, edema, abdominal pain, nausea, melena, diarrhea, constipation, flank pain, dysuria, hematuria, urinary  Frequency, nocturia, numbness, tingling, seizures,  Focal weakness, Loss of consciousness,  Tremor, insomnia, depression, anxiety, and suicidal ideation.      Objective:  BP 110/72 (BP Location: Left Arm, Patient Position: Sitting, Cuff Size: Normal)   Pulse (!) 58   Temp 98.4 F (36.9 C) (Oral)   Resp 14   Ht _0  (1.6 m)   Wt 194 lb 1.9 oz (88.1 kg)   SpO2 98%   BMI 34.39 kg/m   Physical Exam   General appearance: alert, cooperative and appears stated age Head: Normocephalic, without obvious abnormality, atraumatic Eyes: conjunctivae/corneas clear. PERRL, EOM's intact. Fundi benign. Ears: normal TM's and external ear canals both ears Nose: Nares normal. Septum midline. Mucosa normal. No drainage or sinus tenderness. Throat: lips, mucosa, and tongue normal; teeth and gums normal Neck: no adenopathy, no carotid bruit, no JVD,  supple, symmetrical, trachea midline and thyroid not enlarged, symmetric, no tenderness/mass/nodules Lungs: clear to auscultation bilaterally Breasts: normal appearance, no masses or tenderness Heart: regular rate and rhythm, S1, S2 normal, no murmur, click, rub or gallop Abdomen: soft, non-tender; bowel sounds normal; no masses,  no organomegaly Extremities: extremities normal, atraumatic, no cyanosis or edema Pulses: 2+ and symmetric Skin: Skin color, texture, turgor normal. No rashes or lesions Neurologic: Alert and oriented X 3, normal strength and tone. Normal symmetric reflexes. Normal coordination and gait.      Assessment & Plan:   Problem List Items Addressed This Visit    Fatty liver disease, nonalcoholic    Presumed by ultrasound changes and serologies negative for autoimmune causes of hepatitis.  Current liver enzymes have normalized and all modifiable risk factors including obesity  and hyperlipidemia have been addressed . Hepatitis A/B vaccine status will be evaluated.   Lab Results  Component Value Date   ALT 32 10/28/2017   AST 35 10/28/2017   ALKPHOS 86 10/28/2017   BILITOT 0.4 10/28/2017            Hypothyroidism - Primary    Thyroid function is WNL on current dose.  No current changes needed.   Lab Results  Component Value Date   TSH 0.581 10/28/2017         Relevant Orders   TSH (Completed)   Obesity (BMI 30-39.9)    She has been inconsistent with use of phentermine , but when using it has lost 11 lbs in 3 weeks with Low GI diet and exercise. She has no side effects from the medication.  Risks and benefits of medication use discussed and refilled for prn use       Prediabetes    Her random glucose is again elevated but not diagnostic of diabetes .  I recommend she follow a low glycemic index diet and particpate regularly in an aerobic  exercise activity.  We should check an A1c in 6 months.   Lab Results  Component Value Date   HGBA1C 5.8 (H)  10/28/2017         Relevant Orders   Hemoglobin A1C (Completed)   Comp Met (CMET) (Completed)   Visit for preventive health examination    Annual comprehensive preventive exam was done as well as an evaluation and management of chronic conditions .  During the course of the visit the patient was educated and counseled about appropriate screening and preventive services including :  diabetes screening, lipid analysis with projected  10 year  risk for CAD  Which is 4.7 % using the Framingham risk calculator for women, , nutrition counseling, colorectal cancer screening, and recommended immunizations.  Printed recommendations for health maintenance screenings was given.        Other Visit Diagnoses    Encounter for preventive health examination       Need for influenza vaccination       Relevant Orders   Flu Vaccine QUAD 6+ mos PF IM (Fluarix Quad PF) (Completed)      I have discontinued Chester Holstein Harris's Vitamin D3 and triamcinolone. I am also having her maintain her Calcium-Vitamin D-Vitamin K (VIACTIV PO), multivitamin, albuterol, albuterol, Fluticasone-Salmeterol, levothyroxine, ondansetron, escitalopram, phentermine, and fluticasone.  No orders of the defined types were placed in this encounter.   Medications Discontinued During This Encounter  Medication Reason  . Cholecalciferol (VITAMIN D3) 1000 UNITS CAPS Patient has not taken in last 30 days  . triamcinolone (NASACORT) 55 MCG/ACT AERO nasal inhaler Change in therapy    Follow-up: No follow-ups on file.   Crecencio Mc, MD

## 2017-10-29 LAB — HEMOGLOBIN A1C
Est. average glucose Bld gHb Est-mCnc: 120 mg/dL
Hgb A1c MFr Bld: 5.8 % — ABNORMAL HIGH (ref 4.8–5.6)

## 2017-10-29 LAB — COMPREHENSIVE METABOLIC PANEL
A/G RATIO: 1.9 (ref 1.2–2.2)
ALT: 32 IU/L (ref 0–32)
AST: 35 IU/L (ref 0–40)
Albumin: 4.4 g/dL (ref 3.5–5.5)
Alkaline Phosphatase: 86 IU/L (ref 39–117)
BUN/Creatinine Ratio: 12 (ref 9–23)
BUN: 10 mg/dL (ref 6–24)
Bilirubin Total: 0.4 mg/dL (ref 0.0–1.2)
CALCIUM: 9.7 mg/dL (ref 8.7–10.2)
CO2: 21 mmol/L (ref 20–29)
Chloride: 104 mmol/L (ref 96–106)
Creatinine, Ser: 0.86 mg/dL (ref 0.57–1.00)
GFR, EST AFRICAN AMERICAN: 86 mL/min/{1.73_m2} (ref >59)
GFR, EST NON AFRICAN AMERICAN: 75 mL/min/{1.73_m2} (ref >59)
GLOBULIN, TOTAL: 2.3 g/dL (ref 1.5–4.5)
Glucose: 103 mg/dL — ABNORMAL HIGH (ref 65–99)
POTASSIUM: 4.8 mmol/L (ref 3.5–5.2)
SODIUM: 143 mmol/L (ref 134–144)
TOTAL PROTEIN: 6.7 g/dL (ref 6.0–8.5)

## 2017-10-29 LAB — TSH: TSH: 0.581 u[IU]/mL (ref 0.450–4.500)

## 2017-10-30 DIAGNOSIS — R7303 Prediabetes: Secondary | ICD-10-CM | POA: Insufficient documentation

## 2017-10-30 NOTE — Assessment & Plan Note (Signed)
Presumed by ultrasound changes and serologies negative for autoimmune causes of hepatitis.  Current liver enzymes have normalized and all modifiable risk factors including obesity  and hyperlipidemia have been addressed . Hepatitis A/B vaccine status will be evaluated.   Lab Results  Component Value Date   ALT 32 10/28/2017   AST 35 10/28/2017   ALKPHOS 86 10/28/2017   BILITOT 0.4 10/28/2017

## 2017-10-30 NOTE — Assessment & Plan Note (Signed)
She has been inconsistent with use of phentermine , but when using it has lost 11 lbs in 3 weeks with Low GI diet and exercise. She has no side effects from the medication.  Risks and benefits of medication use discussed and refilled for prn use

## 2017-10-30 NOTE — Assessment & Plan Note (Signed)
Annual comprehensive preventive exam was done as well as an evaluation and management of chronic conditions .  During the course of the visit the patient was educated and counseled about appropriate screening and preventive services including :  diabetes screening, lipid analysis with projected  10 year  risk for CAD  Which is 4.7 % using the Framingham risk calculator for women, , nutrition counseling, colorectal cancer screening, and recommended immunizations.  Printed recommendations for health maintenance screenings was given.  

## 2017-10-30 NOTE — Assessment & Plan Note (Signed)
Her random glucose is again elevated but not diagnostic of diabetes .  I recommend she follow a low glycemic index diet and particpate regularly in an aerobic  exercise activity.  We should check an A1c in 6 months.   Lab Results  Component Value Date   HGBA1C 5.8 (H) 10/28/2017

## 2017-10-30 NOTE — Assessment & Plan Note (Signed)
Thyroid function is WNL on current dose.  No current changes needed.   Lab Results  Component Value Date   TSH 0.581 10/28/2017

## 2017-11-16 ENCOUNTER — Telehealth: Payer: No Typology Code available for payment source | Admitting: Physician Assistant

## 2017-11-16 DIAGNOSIS — Z8709 Personal history of other diseases of the respiratory system: Secondary | ICD-10-CM

## 2017-11-16 DIAGNOSIS — J22 Unspecified acute lower respiratory infection: Secondary | ICD-10-CM

## 2017-11-16 MED ORDER — AZITHROMYCIN 250 MG PO TABS: ORAL_TABLET | ORAL | 0 refills | Status: AC

## 2017-11-16 MED ORDER — PREDNISONE 20 MG PO TABS: 40.0000 mg | ORAL_TABLET | Freq: Every day | ORAL | 0 refills | Status: DC

## 2017-11-16 MED ORDER — BENZONATATE 100 MG PO CAPS: 100.0000 mg | ORAL_CAPSULE | Freq: Three times a day (TID) | ORAL | 0 refills | Status: DC

## 2017-11-16 MED FILL — predniSONE 20 MG TABS: 20 | 5 days supply | Qty: 10 | Fill #0

## 2017-11-16 MED FILL — BENZONATATE 100 MG CAP: 100 | 5 days supply | Qty: 30 | Fill #0

## 2017-11-16 MED FILL — AZITHROMYCIN 250 MG TABLET: 250 | 5 days supply | Qty: 6 | Fill #0

## 2017-11-16 NOTE — Progress Notes (Signed)
We are sorry that you are not feeling well.  Here is how we plan to help!  Based on your presentation I believe you most likely have A cough due to bacteria.  When patients have a fever and a productive cough with a change in color or increased sputum production, we are concerned about bacterial bronchitis.  If left untreated it can progress to pneumonia.  If your symptoms do not improve with your treatment plan it is important that you contact your provider.   I have prescribed Azithromyin 250 mg: two tablets now and then one tablet daily for 4 additonal days    In addition you may use A prescription cough medication called Tessalon Perles 100mg . You may take 1-2 capsules every 8 hours as needed for your cough.  I am also prescribed a medium dose of steroids given your history of asthma.  Please use nebulizer therapy as needed every 4-6 hours.   From your responses in the eVisit questionnaire you describe inflammation in the upper respiratory tract which is causing a significant cough.  This is commonly called Bronchitis and has four common causes:   Allergies Viral Infections Acid Reflux Bacterial Infection Allergies, viruses and acid reflux are treated by controlling symptoms or eliminating the cause. An example might be a cough caused by taking certain blood pressure medications. You stop the cough by changing the medication. Another example might be a cough caused by acid reflux. Controlling the reflux helps control the cough.  USE OF BRONCHODILATOR ("RESCUE") INHALERS: There is a risk from using your bronchodilator too frequently.  The risk is that over-reliance on a medication which only relaxes the muscles surrounding the breathing tubes can reduce the effectiveness of medications prescribed to reduce swelling and congestion of the tubes themselves.  Although you feel brief relief from the bronchodilator inhaler, your asthma may actually be worsening with the tubes becoming more swollen and  filled with mucus.  This can delay other crucial treatments, such as oral steroid medications. If you need to use a bronchodilator inhaler daily, several times per day, you should discuss this with your provider.  There are probably better treatments that could be used to keep your asthma under control.     HOME CARE Only take medications as instructed by your medical team. Complete the entire course of an antibiotic. Drink plenty of fluids and get plenty of rest. Avoid close contacts especially the very young and the elderly Cover your mouth if you cough or cough into your sleeve. Always remember to wash your hands A steam or ultrasonic humidifier can help congestion.   GET HELP RIGHT AWAY IF: You develop worsening fever. You become short of breath You cough up blood. Your symptoms persist after you have completed your treatment plan MAKE SURE YOU  Understand these instructions. Will watch your condition. Will get help right away if you are not doing well or get worse.  Your e-visit answers were reviewed by a board certified advanced clinical practitioner to complete your personal care plan.  Depending on the condition, your plan could have included both over the counter or prescription medications. If there is a problem please reply once you have received a response from your provider. Your safety is important to [place].  If you have drug allergies check your prescription carefully.    You can use MyChart to ask questions about today's visit, request a non-urgent call back, or ask for a work or school excuse for 24 hours related to  this e-Visit. If it has been greater than 24 hours you will need to follow up with your provider, or enter a new e-Visit to address those concerns. You will get an e-mail in the next two days asking about your experience.  I hope that your e-visit has been valuable and will speed your recovery. Thank you for using e-visits.

## 2017-11-22 ENCOUNTER — Other Ambulatory Visit: Payer: Self-pay | Admitting: Internal Medicine

## 2017-11-22 ENCOUNTER — Ambulatory Visit
Admission: RE | Admit: 2017-11-22 | Discharge: 2017-11-22 | Disposition: A | Discharge status: Home / Self Care | Payer: No Typology Code available for payment source | Source: Ambulatory Visit | Attending: Internal Medicine | Admitting: Internal Medicine

## 2017-11-22 ENCOUNTER — Encounter: Payer: Self-pay | Admitting: Radiology

## 2017-11-22 DIAGNOSIS — Z1231 Encounter for screening mammogram for malignant neoplasm of breast: Secondary | ICD-10-CM

## 2017-11-22 MED FILL — PHENTERMINE 37.5 MG TABLET: 37.5 | 30 days supply | Qty: 30 | Fill #0

## 2017-11-22 MED FILL — ADVAIR 500/50 DISKUS: 500-50 | 30 days supply | Qty: 60 | Fill #1

## 2017-11-22 NOTE — Telephone Encounter (Signed)
Refilled: 06/15/2017 Last OV: 10/28/2017 Next OV: 11/03/2018

## 2017-11-22 NOTE — Telephone Encounter (Signed)
REFILLED FOR PRN USE BUT SHE HAS TO MAKE A  6 MONTH FOLLOW UP APPOINTMENT  IN [facility]

## 2017-12-31 MED FILL — PHENTERMINE 37.5 MG TABLET: 37.5 | 30 days supply | Qty: 30 | Fill #1

## 2018-01-21 ENCOUNTER — Other Ambulatory Visit: Payer: Self-pay

## 2018-01-21 MED ORDER — ESCITALOPRAM OXALATE 10 MG PO TABS: 10.0000 mg | ORAL_TABLET | Freq: Every day | ORAL | 1 refills | Status: DC

## 2018-01-21 MED FILL — ESCITALOPRAM 10 MG TABLET: 10 | 90 days supply | Qty: 90 | Fill #0

## 2018-02-24 ENCOUNTER — Other Ambulatory Visit: Payer: Self-pay | Admitting: Internal Medicine

## 2018-02-24 MED ORDER — LEVOTHYROXINE SODIUM 25 MCG PO TABS: ORAL_TABLET | ORAL | 0 refills | Status: DC

## 2018-02-24 MED FILL — LEVOTHYROXINE 25 MCG TABLET: 25 | 90 days supply | Qty: 90 | Fill #0

## 2018-02-28 ENCOUNTER — Encounter: Payer: Self-pay | Admitting: Family Medicine

## 2018-02-28 ENCOUNTER — Other Ambulatory Visit: Payer: Self-pay | Admitting: Family Medicine

## 2018-02-28 DIAGNOSIS — M25562 Pain in left knee: Secondary | ICD-10-CM

## 2018-02-28 MED ORDER — TRAMADOL HCL 50 MG PO TABS: 50.0000 mg | ORAL_TABLET | Freq: Four times a day (QID) | ORAL | 0 refills | Status: DC | PRN

## 2018-02-28 MED FILL — traMADol HCL 50 MG TABS: 50 | 3 days supply | Qty: 10 | Fill #0

## 2018-02-28 NOTE — Progress Notes (Signed)
trauu

## 2018-03-01 ENCOUNTER — Encounter: Payer: Self-pay | Admitting: Family Medicine

## 2018-03-01 ENCOUNTER — Ambulatory Visit (INDEPENDENT_AMBULATORY_CARE_PROVIDER_SITE_OTHER): Payer: No Typology Code available for payment source | Admitting: Family Medicine

## 2018-03-01 ENCOUNTER — Other Ambulatory Visit: Payer: Self-pay

## 2018-03-01 ENCOUNTER — Ambulatory Visit (INDEPENDENT_AMBULATORY_CARE_PROVIDER_SITE_OTHER): Payer: No Typology Code available for payment source

## 2018-03-01 VITALS — BP 148/83 | HR 82 | Temp 98.1°F | Resp 14 | Ht 64.0 in | Wt 202.6 lb

## 2018-03-01 DIAGNOSIS — M25462 Effusion, left knee: Secondary | ICD-10-CM | POA: Diagnosis not present

## 2018-03-01 DIAGNOSIS — M25562 Pain in left knee: Secondary | ICD-10-CM | POA: Diagnosis not present

## 2018-03-01 NOTE — Progress Notes (Signed)
Subjective:    Patient ID: Samantha Raymond, female    DOB: 18-Oct-1960, 58 y.o.   MRN: [id number]  HPI Samantha Raymond is a 58 y.o. female Presents today for: Chief Complaint  Patient presents with  . Knee Pain    left knee been hurting off/on for months but sun started hurting more and not sure what I did to knee no know injury   L knee pain: Started  10/2017 at "[employer]", sore after move with kicking legs to side, but sore all over. Unknown swelling.Treated with Epsom salt salts. Worked through it - thought was more muscle. Some persistent soreness, but worse 2 nights ago.  Does feel like swollen past 2 days.  No locking or giving way.  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  No running. No significant change in level of activity recently.  1 week ago did lunges for first time in awhile. May have worsened since lunges.    Tx: ice, ibuprofen. Ultram rx but has not yet started.   No night sweats/fevers/unexplained weight loss.   Knee felt better on prednisone when taken for bronchitis few months ago.    Patient Active Problem List   Diagnosis Date Noted  . Prediabetes 10/30/2017  . History of heat stroke 10/09/2014  . Family history of breast cancer in first degree relative 10/08/2014  . Fatty liver disease, nonalcoholic 25/00/3704  . 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 Unspecified vitamin D deficiency 08/24/2012  . Visit for preventive health examination 08/20/2012  . Menopause 08/17/2012  . Obesity (BMI 30-39.9) 08/09/2011  . Hypothyroidism 08/09/2011  . Recurrent urinary tract infection   . Neutropenia ([place]) 08/07/2011   Past Medical History:  Diagnosis Date  . Asthma   . hypothyroid   . Recurrent urinary tract infection    Past Surgical History:  Procedure Laterality Date  . CESAREAN SECTION     Allergies  Allergen Reactions  . Sulfa Antibiotics Nausea And Vomiting   Prior to  Admission medications   Medication Sig Start Date End Date Taking? Authorizing Provider  albuterol (PROVENTIL HFA;VENTOLIN HFA) 108 (90 Base) MCG/ACT inhaler Inhale 2 puffs into the lungs every 6 (six) hours as needed for wheezing or shortness of breath. 01/24/16  Yes Stallings, Zoe A, MD  albuterol (PROVENTIL) (2.5 MG/3ML) 0.083% nebulizer solution Take 3 mLs (2.5 mg total) by nebulization every 6 (six) hours as needed for wheezing or shortness of breath. 04/08/15  Yes Shawnee Knapp, MD  Calcium-Vitamin D-Vitamin K (VIACTIV PO) Take by mouth.   Yes [provider]  escitalopram (LEXAPRO) 10 MG tablet Take 1 tablet (10 mg total) by mouth daily. 01/21/18  Yes Crecencio Mc, MD  fluticasone (FLONASE) 50 MCG/ACT nasal spray Place 1 spray into both nostrils daily.   Yes [provider]  Fluticasone-Salmeterol (ADVAIR DISKUS) 500-50 MCG/DOSE AEPB Inhale 1 puff 2 (two) times daily into the lungs. RINSE MOUTH AFTER EACH USE TO PREVENT THRUSH 12/28/16  Yes Crecencio Mc, MD  levothyroxine (SYNTHROID, LEVOTHROID) 25 MCG tablet TAKE 1 TABLET BY MOUTH DAILY BEFORE BREAKFAST. 02/24/18  Yes Crecencio Mc, MD  Multiple Vitamin (MULTIVITAMIN) tablet Take 1 tablet by mouth daily.   Yes [provider]  ondansetron (ZOFRAN ODT) 4 MG disintegrating tablet Take 1 tablet (4 mg total) by mouth every 8 (eight) hours as needed for nausea. 04/06/17  Yes Weber, Sarah L, PA-C  phentermine (ADIPEX-P) 37.5 MG tablet TAKE 1/2  TABLET BY MOUTH IN THE MORNING AND EARLY AFTERNOON 11/22/17  Yes Crecencio Mc, MD  traMADol (ULTRAM) 50 MG tablet Take 1 tablet (50 mg total) by mouth every 6 (six) hours as needed. 02/28/18  Yes Wendie Agreste, MD  benzonatate (TESSALON) 100 MG capsule Take 1-2 capsules (100-200 mg total) by mouth 3 (three) times daily. Patient not taking: Reported on 03/01/2018 11/16/17   Tereasa Coop, PA-C   Social History   Socioeconomic History  . Marital status: Married    Spouse  name: Not on file  . Number of children: Not on file  . Years of education: Not on file  . Highest education level: Not on file  Occupational History  . Not on file  Social Needs  . Financial resource strain: Not on file  . Food insecurity:    Worry: Not on file    Inability: Not on file  . Transportation needs:    Medical: Not on file    Non-medical: Not on file  Tobacco Use  . Smoking status: Never Smoker  . Smokeless tobacco: Never Used  Substance and Sexual Activity  . Alcohol use: Yes    Alcohol/week: 0.0 standard drinks  . Drug use: No  . Sexual activity: Not on file  Lifestyle  . Physical activity:    Days per week: Not on file    Minutes per session: Not on file  . Stress: Not on file  Relationships  . Social connections:    Talks on phone: Not on file    Gets together: Not on file    Attends religious service: Not on file    Active member of club or organization: Not on file    Attends meetings of clubs or organizations: Not on file    Relationship status: Not on file  . Intimate partner violence:    Fear of current or ex partner: Not on file    Emotionally abused: Not on file    Physically abused: Not on file    Forced sexual activity: Not on file  Other Topics Concern  . Not on file  Social History Narrative  . Not on file    Review of Systems  Constitutional: Negative for fever and unexpected weight change.  Musculoskeletal: Positive for arthralgias and joint swelling.  Skin: Negative for rash and wound.        Objective:   Physical Exam Vitals signs reviewed.  Constitutional:      General: She is not in acute distress.    Appearance: She is well-developed.  HENT:     Head: Normocephalic and atraumatic.  Cardiovascular:     Rate and Rhythm: Normal rate.  Pulmonary:     Effort: Pulmonary effort is normal.  Musculoskeletal:     Left knee: She exhibits decreased range of motion (lacking about 10degrees ext, able to flex to 100 only. ),  effusion (suspected effusion, ) and abnormal meniscus (guarded mcmurray with pain at medial jt line. ). She exhibits no LCL laxity and no MCL laxity. Tenderness found. Medial joint line tenderness noted.     Left lower leg: She exhibits no tenderness.  Neurological:     General: No focal deficit present.     Mental Status: She is alert and oriented to person, place, and time.     Sensory: No sensory deficit.    Vitals:   03/01/18 0836  BP: (!) 148/83  Pulse: 82  Resp: 14  Temp: 98.1 F (36.7 C)  TempSrc: Oral  SpO2: 100%  Weight: 202 lb 9.6 oz (91.9 kg)  Height: 5\' 4"  (1.626 m)    Dg Knee Complete 4 Views Left  Result Date: 03/01/2018 CLINICAL DATA:  Left knee pain EXAM: LEFT KNEE - COMPLETE 4+ VIEW COMPARISON:  None. FINDINGS: Tricompartment degenerative changes with joint space narrowing and spurring. Small joint effusion. No acute bony abnormality. Specifically, no fracture, subluxation, or dislocation. IMPRESSION: Mild tricompartment degenerative changes. No acute bony abnormality. Small joint effusion. Electronically Signed   By: Rolm Baptise M.D.   On: 03/01/2018 09:20   Risks (including but not limited to bleeding and infection), benefits, and alternatives discussed for L knee injection, superolateral approach.   Verbal consent obtained after any questions were answered. Landmarks noted, and marked as needed. Area cleansed with Betadine x3, ethyl chloride spray for topical anesthesia, followed by alcohol swab. aspirated 12cc clear yellow fluid,   Injected with 3cc lidocaine 1%, 1cc kenalog 40mg /ml. No complications. Bandage applied.  RTC precautions discussed in regards to injection.    Assessment & Plan:   AMYRAH PINKHASOV is a 58 y.o. female Left knee pain, unspecified chronicity - Plan: DG Knee Complete 4 Views Left, Apply knee sleeve, Crutches  Knee effusion, left - Plan: Apply knee sleeve, Crutches  -Intermittent left knee pain for approximately 4 months, not  sufficiently improved with over-the-counter treatments, now with acute worsening past few days.  Differential includes degenerative meniscal disease given degenerative changes seen on x-ray.   - Treatment options were discussed, ultimately decided on trial of injection.  Performed as above without difficulty/complications.  -Crutches, off loader brace given.  Update on status with the next 2 weeks to determine if orthopedic evaluation indicated.  RTC precautions if worse sooner.   -Has tramadol prescription if needed, okay to refill if needed.  No orders of the defined types were placed in this encounter.  Patient Instructions   Knee pain could be due to degenerative meniscus issue.  I did aspirate some fluid, but the injection of steroid and lidocaine should help.  Crutches for now, hinged knee brace for now, ibuprofen if needed, tramadol if needed, let me know if refill is needed.  Continue ice and elevation, and give me an update in the next week to 2 weeks at the most.  If not improving I can refer you to orthopedics.  Thanks for coming in today.   Knee Injection A knee injection is a procedure to get medicine into your knee joint to relieve the pain, swelling, and stiffness of arthritis. Your health care provider uses a needle to inject medicine, which may also help to lubricate and cushion your knee joint. You may need more than one injection. Tell a health care provider about:  Any allergies you have.  All medicines you are taking, including vitamins, herbs, eye drops, creams, and over-the-counter medicines.  Any problems you or family members have had with anesthetic medicines.  Any blood disorders you have.  Any surgeries you have had.  Any medical conditions you have.  Whether you are pregnant or may be pregnant. What are the risks? Generally, this is a safe procedure. However, problems may occur, including:  Infection.  Bleeding.  Symptoms that get worse.  Damage to  the area around your knee.  Allergic reaction to any of the medicines.  Skin reactions from repeated injections. What happens before the procedure?  Ask your health care provider about changing or stopping your regular medicines. This is especially important if  you are taking diabetes medicines or blood thinners.  Plan to have someone take you home from the hospital or clinic. What happens during the procedure?   You will sit or lie down in a position for your knee to be treated.  The skin over your kneecap will be cleaned with a germ-killing soap.  You will be given a medicine that numbs the area (local anesthetic). You may feel some stinging.  The medicine will be injected into your knee. The needle is carefully placed between your kneecap and your knee. The medicine is injected into the joint space.  The needle will be removed at the end of the procedure.  A bandage (dressing) may be placed over the injection site. The procedure may vary among health care providers and hospitals. What can I expect after the procedure?  Your blood pressure, heart rate, breathing rate, and blood oxygen level will be monitored until you leave the hospital or clinic.  You may have to move your knee through its full range of motion. This helps to get all the medicine into your joint space.  You will be watched to make sure that you do not have a reaction to the injected medicine.  You may feel more pain, swelling, and warmth than you did before the injection. This reaction may last about 1-2 days. Follow these instructions at home: Medicines  Take over-the-counter and prescription medicines only as told by your doctor.  Do not drive or use heavy machinery while taking prescription pain medicine.  Do not take medicines such as aspirin and ibuprofen unless your health care provider tells you to take them. Injection site care  Follow instructions from your health care provider about: ? How to  take care of your puncture site. ? When and how you should change your dressing. ? When you should remove your dressing.  Check your injection area every day for signs of infection. Check for: ? More redness, swelling, or pain after 2 days. ? Fluid or blood. ? Pus or a bad smell. ? Warmth. Managing pain, stiffness, and swelling   If directed, put ice on the injection area: ? Put ice in a plastic bag. ? Place a towel between your skin and the bag. ? Leave the ice on for 20 minutes, 2-3 times per day.  Do not apply heat to your knee.  Raise (elevate) the injection area above the level of your heart while you are sitting or lying down. General instructions  If you were given a dressing, keep it dry until your health care provider says it can be removed. Ask your health care provider when you can start showering or taking a bath.  Avoid strenuous activities for as long as directed by your health care provider. Ask your health care provider when you can return to your normal activities.  Keep all follow-up visits as told by your health care provider. This is important. You may need more injections. Contact a health care provider if you have:  A fever.  Warmth in your injection area.  Fluid, blood, or pus coming from your injection site.  Symptoms at your injection site that last longer than 2 days after your procedure. Get help right away if:  Your knee: ? Turns very red. ? Becomes very swollen. ? Is in severe pain. Summary  A knee injection is a procedure to get medicine into your knee joint to relieve the pain, swelling, and stiffness of arthritis.  A needle is carefully placed  between your kneecap and your knee to inject medicine into the joint space.  Before the procedure, ask your health care provider about changing or stopping your regular medicines, especially if you are taking diabetes medicines or blood thinners.  Contact your health care provider if you have any  problems or questions after your procedure. This information is not intended to replace advice given to you by your health care provider. Make sure you discuss any questions you have with your health care provider. Document Released: 04/26/2006 Document Revised: 02/22/2017 Document Reviewed: 02/22/2017 Elsevier Interactive Patient Education  2019 [employer].  Acute Knee Pain, Adult Acute knee pain is sudden and may be caused by damage, swelling, or irritation of the muscles and tissues that support your knee. The injury may result from:  A fall.  An injury to your knee from twisting motions.  A hit to the knee.  Infection. Acute knee pain may go away on its own with time and rest. If it does not, your health care provider may order tests to find the cause of the pain. These may include:  Imaging tests, such as an X-ray, MRI, or ultrasound.  Joint aspiration. In this test, fluid is removed from the knee.  Arthroscopy. In this test, a lighted tube is inserted into the knee and an image is projected onto a TV screen.  Biopsy. In this test, a sample of tissue is removed from the body and studied under a microscope. Follow these instructions at home: Pay attention to any changes in your symptoms. Take these actions to relieve your pain. If you have a knee sleeve or brace:   Wear the sleeve or brace as told by your health care provider. Remove it only as told by your health care provider.  Loosen the sleeve or brace if your toes tingle, become numb, or turn cold and blue.  Keep the sleeve or brace clean.  If the sleeve or brace is not waterproof: ? Do not let it get wet. ? Cover it with a watertight covering when you take a bath or shower. Activity  Rest your knee.  Do not do things that cause pain or make pain worse.  Avoid high-impact activities or exercises, such as running, jumping rope, or doing jumping jacks.  Work with a physical therapist to make a safe exercise  program, as recommended by your health care provider. Do exercises as told by your physical therapist. Managing pain, stiffness, and swelling   If directed, put ice on the knee: ? Put ice in a plastic bag. ? Place a towel between your skin and the bag. ? Leave the ice on for 20 minutes, 2-3 times a day.  If directed, use an elastic bandage to put pressure (compression) on your injured knee. This may control swelling, give support, and help with discomfort. General instructions  Take over-the-counter and prescription medicines only as told by your health care provider.  Raise (elevate) your knee above the level of your heart when you are sitting or lying down.  Sleep with a pillow under your knee.  Do not use any products that contain nicotine or tobacco, such as cigarettes, e-cigarettes, and chewing tobacco. These can delay healing. If you need help quitting, ask your health care provider.  If you are overweight, work with your health care provider and a dietitian to set a weight-loss goal that is healthy and reasonable for you. Extra weight can put pressure on your knee.  Keep all follow-up visits as  told by your health care provider. This is important. Contact a health care provider if:  Your knee pain continues, changes, or gets worse.  You have a fever along with knee pain.  Your knee feels warm to the touch.  Your knee buckles or locks up. Get help right away if:  Your knee swells, and the swelling becomes worse.  You cannot move your knee.  You have severe pain in your knee. Summary  Acute knee pain can be caused by a fall, an injury, an infection, or damage, swelling, or irritation of the tissues that support your knee.  Your health care provider may perform tests to find out the cause of the pain.  Pay attention to any changes in your symptoms. Relieve your pain with rest, medicines, light activity, and use of ice.  Get help if your pain continues or becomes  worse, your knee swells, or you cannot move your knee. This information is not intended to replace advice given to you by your health care provider. Make sure you discuss any questions you have with your health care provider. Document Released: 11/30/2006 Document Revised: 07/15/2017 Document Reviewed: 07/15/2017 Elsevier Interactive Patient Education  [employer].     If you have lab work done today you will be contacted with your lab results within the next 2 weeks.  If you have not heard from [place] then please contact us. The fastest way to get your results is to register for My Chart.   IF you received an x-ray today, you will receive an invoice from [facility]. Please contact [facility] at [phone] with questions or concerns regarding your invoice.   IF you received labwork today, you will receive an invoice from [facility]. Please contact [facility] at [phone] with questions or concerns regarding your invoice.   Our billing staff will not be able to assist you with questions regarding bills from these companies.  You will be contacted with the lab results as soon as they are available. The fastest way to get your results is to activate your My Chart account. Instructions are located on the last page of this paperwork. If you have not heard from [place] regarding the results in 2 weeks, please contact this office.       Signed,   [name], MD [facility] [place].  03/01/18 12:57 PM

## 2018-03-01 NOTE — Patient Instructions (Addendum)
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  I did aspirate some fluid, but the injection of steroid and lidocaine should help.  Crutches for now, hinged knee brace for now, ibuprofen if needed, tramadol if needed, let me know if refill is needed.  Continue ice and elevation, and give me an update in the next week to 2 weeks at the most.  If not improving I can refer you to orthopedics.  Thanks for coming in today.   Knee Injection A knee injection is a procedure to get medicine into your knee joint to relieve the pain, swelling, and stiffness of arthritis. Your health care provider uses a needle to inject medicine, which may also help to lubricate and cushion your knee joint. You may need more than one injection. Tell a health care provider about:  Any allergies you have.  All medicines you are taking, including vitamins, herbs, eye drops, creams, and over-the-counter medicines.  Any problems you or family members have had with anesthetic medicines.  Any blood disorders you have.  Any surgeries you have had.  Any medical conditions you have.  Whether you are pregnant or may be pregnant. What are the risks? Generally, this is a safe procedure. However, problems may occur, including:  Infection.  Bleeding.  Symptoms that get worse.  Damage to the area around your knee.  Allergic reaction to any of the medicines.  Skin reactions from repeated injections. What happens before the procedure?  Ask your health care provider about changing or stopping your regular medicines. This is especially important if you are taking diabetes medicines or blood thinners.  Plan to have someone take you home from the hospital or clinic. What happens during the procedure?   You will sit or lie down in a position for your knee to be treated.  The skin over your kneecap will be cleaned with a germ-killing soap.  You will be given a medicine that numbs the area (local anesthetic). You  may feel some stinging.  The medicine will be injected into your knee. The needle is carefully placed between your kneecap and your knee. The medicine is injected into the joint space.  The needle will be removed at the end of the procedure.  A bandage (dressing) may be placed over the injection site. The procedure may vary among health care providers and hospitals. What can I expect after the procedure?  Your blood pressure, heart rate, breathing rate, and blood oxygen level will be monitored until you leave the hospital or clinic.  You may have to move your knee through its full range of motion. This helps to get all the medicine into your joint space.  You will be watched to make sure that you do not have a reaction to the injected medicine.  You may feel more pain, swelling, and warmth than you did before the injection. This reaction may last about 1-2 days. Follow these instructions at home: Medicines  Take over-the-counter and prescription medicines only as told by your doctor.  Do not drive or use heavy machinery while taking prescription pain medicine.  Do not take medicines such as aspirin and ibuprofen unless your health care provider tells you to take them. Injection site care  Follow instructions from your health care provider about: ? How to take care of your puncture site. ? When and how you should change your dressing. ? When you should remove your dressing.  Check your injection area every day for signs of infection.  Check for: ? More redness, swelling, or pain after 2 days. ? Fluid or blood. ? Pus or a bad smell. ? Warmth. Managing pain, stiffness, and swelling   If directed, put ice on the injection area: ? Put ice in a plastic bag. ? Place a towel between your skin and the bag. ? Leave the ice on for 20 minutes, 2-3 times per day.  Do not apply heat to your knee.  Raise (elevate) the injection area above the level of your heart while you are sitting  or lying down. General instructions  If you were given a dressing, keep it dry until your health care provider says it can be removed. Ask your health care provider when you can start showering or taking a bath.  Avoid strenuous activities for as long as directed by your health care provider. Ask your health care provider when you can return to your normal activities.  Keep all follow-up visits as told by your health care provider. This is important. You may need more injections. Contact a health care provider if you have:  A fever.  Warmth in your injection area.  Fluid, blood, or pus coming from your injection site.  Symptoms at your injection site that last longer than 2 days after your procedure. Get help right away if:  Your knee: ? Turns very red. ? Becomes very swollen. ? Is in severe pain. Summary  A knee injection is a procedure to get medicine into your knee joint to relieve the pain, swelling, and stiffness of arthritis.  A needle is carefully placed between your kneecap and your knee to inject medicine into the joint space.  Before the procedure, ask your health care provider about changing or stopping your regular medicines, especially if you are taking diabetes medicines or blood thinners.  Contact your health care provider if you have any problems or questions after your procedure. This information is not intended to replace advice given to you by your health care provider. Make sure you discuss any questions you have with your health care provider. Document Released: 04/26/2006 Document Revised: 02/22/2017 Document Reviewed: 02/22/2017 Elsevier Interactive Patient Education  2019 [place].  Acute Knee Pain, Adult Acute knee pain is sudden and may be caused by damage, swelling, or irritation of the muscles and tissues that support your knee. The injury may result from:  A fall.  An injury to your knee from twisting motions.  A hit to the  knee.  Infection. Acute knee pain may go away on its own with time and rest. If it does not, your health care provider may order tests to find the cause of the pain. These may include:  Imaging tests, such as an X-ray, MRI, or ultrasound.  Joint aspiration. In this test, fluid is removed from the knee.  Arthroscopy. In this test, a lighted tube is inserted into the knee and an image is projected onto a TV screen.  Biopsy. In this test, a sample of tissue is removed from the body and studied under a microscope. Follow these instructions at home: Pay attention to any changes in your symptoms. Take these actions to relieve your pain. If you have a knee sleeve or brace:   Wear the sleeve or brace as told by your health care provider. Remove it only as told by your health care provider.  Loosen the sleeve or brace if your toes tingle, become numb, or turn cold and blue.  Keep the sleeve or brace clean.  If the sleeve or brace is not waterproof: ? Do not let it get wet. ? Cover it with a watertight covering when you take a bath or shower. Activity  Rest your knee.  Do not do things that cause pain or make pain worse.  Avoid high-impact activities or exercises, such as running, jumping rope, or doing jumping jacks.  Work with a physical therapist to make a safe exercise program, as recommended by your health care provider. Do exercises as told by your physical therapist. Managing pain, stiffness, and swelling   If directed, put ice on the knee: ? Put ice in a plastic bag. ? Place a towel between your skin and the bag. ? Leave the ice on for 20 minutes, 2-3 times a day.  If directed, use an elastic bandage to put pressure (compression) on your injured knee. This may control swelling, give support, and help with discomfort. General instructions  Take over-the-counter and prescription medicines only as told by your health care provider.  Raise (elevate) your knee above the level  of your heart when you are sitting or lying down.  Sleep with a pillow under your knee.  Do not use any products that contain nicotine or tobacco, such as cigarettes, e-cigarettes, and chewing tobacco. These can delay healing. If you need help quitting, ask your health care provider.  If you are overweight, work with your health care provider and a dietitian to set a weight-loss goal that is healthy and reasonable for you. Extra weight can put pressure on your knee.  Keep all follow-up visits as told by your health care provider. This is important. Contact a health care provider if:  Your knee pain continues, changes, or gets worse.  You have a fever along with knee pain.  Your knee feels warm to the touch.  Your knee buckles or locks up. Get help right away if:  Your knee swells, and the swelling becomes worse.  You cannot move your knee.  You have severe pain in your knee. Summary  Acute knee pain can be caused by a fall, an injury, an infection, or damage, swelling, or irritation of the tissues that support your knee.  Your health care provider may perform tests to find out the cause of the pain.  Pay attention to any changes in your symptoms. Relieve your pain with rest, medicines, light activity, and use of ice.  Get help if your pain continues or becomes worse, your knee swells, or you cannot move your knee. This information is not intended to replace advice given to you by your health care provider. Make sure you discuss any questions you have with your health care provider. Document Released: 11/30/2006 Document Revised: 07/15/2017 Document Reviewed: 07/15/2017 Elsevier Interactive Patient Education  [employer].     If you have lab work done today you will be contacted with your lab results within the next 2 weeks.  If you have not heard from [place] then please contact us. The fastest way to get your results is to register for My Chart.   IF you received an  x-ray today, you will receive an invoice from [facility]. Please contact [facility] at [phone] with questions or concerns regarding your invoice.   IF you received labwork today, you will receive an invoice from [facility]. Please contact [facility] at [phone] with questions or concerns regarding your invoice.   Our billing staff will not be able to assist you with questions regarding bills from these companies.  You will be contacted with the lab results as soon as they are available. The fastest way to get your results is to activate your My Chart account. Instructions are located on the last page of this paperwork. If you have not heard from [place] regarding the results in 2 weeks, please contact this office.

## 2018-03-14 MED FILL — PHENTERMINE 37.5 MG TABLET: 37.5 | 30 days supply | Qty: 30 | Fill #2

## 2018-03-29 ENCOUNTER — Encounter: Payer: Self-pay | Admitting: Family Medicine

## 2018-03-29 ENCOUNTER — Ambulatory Visit (INDEPENDENT_AMBULATORY_CARE_PROVIDER_SITE_OTHER): Payer: No Typology Code available for payment source | Admitting: Family Medicine

## 2018-03-29 VITALS — BP 122/68 | HR 90 | Temp 98.4°F | Resp 18 | Ht 64.0 in | Wt 200.6 lb

## 2018-03-29 DIAGNOSIS — B370 Candidal stomatitis: Secondary | ICD-10-CM | POA: Diagnosis not present

## 2018-03-29 DIAGNOSIS — J4521 Mild intermittent asthma with (acute) exacerbation: Secondary | ICD-10-CM | POA: Diagnosis not present

## 2018-03-29 DIAGNOSIS — R05 Cough: Secondary | ICD-10-CM

## 2018-03-29 DIAGNOSIS — R059 Cough, unspecified: Secondary | ICD-10-CM

## 2018-03-29 LAB — POC INFLUENZA A&B (BINAX/QUICKVUE)
Influenza A, POC: NEGATIVE
Influenza B, POC: NEGATIVE

## 2018-03-29 MED ORDER — NYSTATIN 100000 UNIT/ML MT SUSP: 5.0000 mL | Freq: Four times a day (QID) | OROMUCOSAL | 0 refills | Status: DC

## 2018-03-29 MED ORDER — BENZONATATE 100 MG PO CAPS: 100.0000 mg | ORAL_CAPSULE | Freq: Three times a day (TID) | ORAL | 1 refills | Status: DC

## 2018-03-29 MED ORDER — PREDNISONE 10 MG PO TABS: ORAL_TABLET | ORAL | 0 refills | Status: DC

## 2018-03-29 MED ORDER — ALBUTEROL SULFATE (2.5 MG/3ML) 0.083% IN NEBU: 2.5000 mg | INHALATION_SOLUTION | Freq: Four times a day (QID) | RESPIRATORY_TRACT | 1 refills | Status: DC | PRN

## 2018-03-29 MED ORDER — ALBUTEROL SULFATE HFA 108 (90 BASE) MCG/ACT IN AERS: 2.0000 | INHALATION_SPRAY | Freq: Four times a day (QID) | RESPIRATORY_TRACT | 3 refills | Status: DC | PRN

## 2018-03-29 NOTE — Progress Notes (Signed)
Subjective:    Patient ID: Samantha Raymond, female    DOB: May 26, 1960, 58 y.o.   MRN: [id number]  HPI  Presents to clinic due to cough, congestion and feeling like asthma is flaring up.  Patient states her symptoms have been present for 3 days.  She has been using her nebulizer machine 1 time per day over the past 3 days, has taken Mucinex to try and thin secretions and Tessalon Perles to help calm cough with okay affect.  Also uses her Advair every day as prescribed.  Patient feels like the congestion and tightness in chest seems to be not improving.  Also has noticed some white film on tongue and roof of mouth.  States at times when she coughs, will cough up thick or white phlegm.  Denies fever or chills.  Denies chest pain.  Denies nausea/vomiting or diarrhea.  Patient Active Problem List   Diagnosis Date Noted  . Prediabetes 10/30/2017  . History of heat stroke 10/09/2014  . Family history of breast cancer in first degree relative 10/08/2014  . Fatty liver disease, nonalcoholic 67/61/9509  . 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 Unspecified vitamin D deficiency 08/24/2012  . Visit for preventive health examination 08/20/2012  . Menopause 08/17/2012  . Obesity (BMI 30-39.9) 08/09/2011  . Hypothyroidism 08/09/2011  . Recurrent urinary tract infection   . Neutropenia ([place]) 08/07/2011   Social History   Tobacco Use  . Smoking status: Never Smoker  . Smokeless tobacco: Never Used  Substance Use Topics  . Alcohol use: Yes    Alcohol/week: 0.0 standard drinks    Review of Systems  Constitutional: Negative for chills, fatigue and fever.  HENT: Negative for congestion, ear pain, sinus pain and sore throat. +white film on tongue/roof mouth  Eyes: Negative.   Respiratory: +cough, chest congestion, shortness of breath and wheezing.   Cardiovascular: Negative for chest pain, palpitations and leg swelling.  Gastrointestinal: Negative for abdominal pain, diarrhea, nausea and  vomiting.  Genitourinary: Negative for dysuria, frequency and urgency.  Musculoskeletal: Negative for arthralgias and myalgias.  Skin: Negative for color change, pallor and rash.  Neurological: Negative for syncope, light-headedness and headaches.  Psychiatric/Behavioral: The patient is not nervous/anxious.       Objective:   Physical Exam Vitals signs and nursing note reviewed.  Constitutional:      General: She is not in acute distress.    Appearance: She is not toxic-appearing.  HENT:     Head: Normocephalic.     Right Ear: Tympanic membrane, ear canal and external ear normal.     Left Ear: Tympanic membrane, ear canal and external ear normal.     Nose: Nose normal.     Mouth/Throat:     Mouth: Mucous membranes are moist.     Pharynx: No oropharyngeal exudate or posterior oropharyngeal erythema.     Comments: +white film on tongue/roof of mouth consistent with thrush Eyes:     General: No scleral icterus.    Extraocular Movements: Extraocular movements intact.     Conjunctiva/sclera: Conjunctivae normal.  Neck:     Musculoskeletal: Neck supple. No neck rigidity.  Cardiovascular:     Rate and Rhythm: Normal rate and regular rhythm.  Pulmonary:     Effort: Pulmonary effort is normal. No respiratory distress.     Breath sounds: Wheezing (scattered rhonchi upper lobes) present. No rales.     Comments: Harsh, raspy cough Lymphadenopathy:     Cervical: No cervical adenopathy.  Skin:  General: Skin is warm and dry.     Coloration: Skin is not jaundiced or pale.  Neurological:     Mental Status: She is alert and oriented to person, place, and time.  Psychiatric:        Mood and Affect: Mood normal.        Behavior: Behavior normal.     Vitals:   03/29/18 1541  BP: 122/68  Pulse: 90  Resp: 18  Temp: 98.4 F (36.9 C)  SpO2: 97%      Assessment & Plan:   Asthma exacerbation, cough - patient will take oral steroid taper.  Advised to use nebulizer at least 2-3  times per day over the next 5 days, then resume as needed use.  Albuterol inhaler refilled to allow patient to have access to rescue inhaler if out of the home and cannot use nebulizer.  She will use Tessalon Perls as needed to calm cough.  She will also continue Advair as normally prescribed.  Advised to keep up good fluid intake, get plenty of rest and do good handwashing.  Oral thrush- thrush most likely from use of Advair.  Advised to always rinse out mouth after Advair use.  She will also do nystatin swish and swallow to treat oral thrush.  Advised patient if she is not improving by the end of the week to let us know, we can consider doing CXR & sending an antibiotic at that time to cover possible respiratory infection.

## 2018-03-30 ENCOUNTER — Telehealth: Payer: Self-pay | Admitting: Internal Medicine

## 2018-03-30 ENCOUNTER — Other Ambulatory Visit: Payer: Self-pay | Admitting: Family Medicine

## 2018-03-30 DIAGNOSIS — J4521 Mild intermittent asthma with (acute) exacerbation: Secondary | ICD-10-CM

## 2018-03-30 MED ORDER — DOXYCYCLINE HYCLATE 100 MG PO TABS: 100.0000 mg | ORAL_TABLET | Freq: Two times a day (BID) | ORAL | 0 refills | Status: DC

## 2018-03-30 MED FILL — DOXYCYCLINE HYCLATE 100 MG: 100 | 7 days supply | Qty: 14 | Fill #0

## 2018-03-30 NOTE — Telephone Encounter (Signed)
Pt confirmed that she would like RX sent to [facility]

## 2018-03-30 NOTE — Telephone Encounter (Signed)
Called Pt No answer, left VM to call office. I was calling Pt to find out which pharmacy she wanted her Rx sent to  [facility] for [facility] to talk to Pt

## 2018-03-30 NOTE — Telephone Encounter (Signed)
Please confirm the pharmacy she wants and I will send in RX  [facility]

## 2018-03-30 NOTE — Telephone Encounter (Signed)
Copied from [place] [id number]. Topic: Quick Communication - Rx Refill/Question >> Mar 30, 2018  1:41 PM Scherrie Gerlach wrote: Medication: abx  Pt saw Lauren on 2/11 and was told if she needed the abx to call back. Pt has decided that she DOES need the abx and would like it called into [place], [place] - [place] [phone] (Phone) [phone] (Fax)

## 2018-03-30 NOTE — Telephone Encounter (Signed)
Rx sent for doxycycline course to [facility]

## 2018-03-30 NOTE — Telephone Encounter (Signed)
Confirmed no new acute symptoms but she does think that she needs the abx that was offered.

## 2018-04-04 ENCOUNTER — Encounter: Payer: Self-pay | Admitting: Family Medicine

## 2018-04-04 ENCOUNTER — Ambulatory Visit (INDEPENDENT_AMBULATORY_CARE_PROVIDER_SITE_OTHER): Payer: No Typology Code available for payment source

## 2018-04-04 ENCOUNTER — Ambulatory Visit (INDEPENDENT_AMBULATORY_CARE_PROVIDER_SITE_OTHER): Payer: No Typology Code available for payment source | Admitting: Family Medicine

## 2018-04-04 VITALS — BP 138/80 | HR 87 | Temp 98.2°F | Resp 16 | Ht 64.0 in | Wt 205.0 lb

## 2018-04-04 DIAGNOSIS — J4521 Mild intermittent asthma with (acute) exacerbation: Secondary | ICD-10-CM | POA: Diagnosis not present

## 2018-04-04 DIAGNOSIS — J988 Other specified respiratory disorders: Secondary | ICD-10-CM | POA: Diagnosis not present

## 2018-04-04 MED ORDER — LEVOFLOXACIN 750 MG PO TABS: 750.0000 mg | ORAL_TABLET | Freq: Every day | ORAL | 0 refills | Status: DC

## 2018-04-04 MED ORDER — METHYLPREDNISOLONE ACETATE 80 MG/ML IJ SUSP
80.0000 mg | Freq: Once | INTRAMUSCULAR | Status: AC
  Administered 2018-04-04: 80 mg via INTRAMUSCULAR

## 2018-04-04 MED ORDER — PREDNISONE 10 MG PO TABS: ORAL_TABLET | ORAL | 0 refills | Status: DC

## 2018-04-04 NOTE — Patient Instructions (Addendum)
Stop Doxycycline  Start levaquin  Do another steroid taper  Use albuterol neb at home and inhaler when out of the house  If you are not feeling better by end of the week, please let us know!

## 2018-04-04 NOTE — Progress Notes (Signed)
Subjective:    Patient ID: Samantha Raymond, female    DOB: 1960/11/26, 58 y.o.   MRN: [id number]  HPI   Patient presents to clinic with continued cough, chest congestion, feeling short of breath.  Does feel slightly improved after taking steroid, but does not feel as good as she believes she should after taking antibiotics and steroids.  Usually in the past, steroids really work well to open her up and make her breathing much better.  States she will cough so hard, she will feel like she cannot breathe and this is scary.  She will use dose of albuterol inhaler or nebulizer and this does help to calm the shortness of breath and wheezing down.  Patient states she went to the beach this past weekend with her family, states she felt like she could not even walk around because she was so tired and coughing so much.  States she is concerned because she is feeling similar to when she had pneumonia a few years ago.  We did not do chest x-ray last visit, we will plan to do one today.  Patient Active Problem List   Diagnosis Date Noted  . Prediabetes 10/30/2017  . History of heat stroke 10/09/2014  . Family history of breast cancer in first degree relative 10/08/2014  . Fatty liver disease, nonalcoholic 56/21/3086  . 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 Unspecified vitamin D deficiency 08/24/2012  . Visit for preventive health examination 08/20/2012  . Menopause 08/17/2012  . Obesity (BMI 30-39.9) 08/09/2011  . Hypothyroidism 08/09/2011  . Recurrent urinary tract infection   . Neutropenia ([place]) 08/07/2011   Social History   Tobacco Use  . Smoking status: Never Smoker  . Smokeless tobacco: Never Used  Substance Use Topics  . Alcohol use: Yes    Alcohol/week: 0.0 standard drinks   Review of Systems   Constitutional: Negative for chills, fatigue and fever.  HENT: Negative for congestion, ear pain, sinus pain and sore throat.   Eyes: Negative.   Respiratory: +cough, chest congestion,  shortness of breath and wheezing.   Cardiovascular: Negative for chest pain, palpitations and leg swelling.  Gastrointestinal: Negative for abdominal pain, diarrhea, nausea and vomiting.  Genitourinary: Negative for dysuria, frequency and urgency.  Musculoskeletal: Negative for arthralgias and myalgias.  Skin: Negative for color change, pallor and rash.  Neurological: Negative for syncope, light-headedness and headaches.  Psychiatric/Behavioral: The patient is not nervous/anxious.       Objective:   Physical Exam Vitals signs and nursing note reviewed.  Constitutional:      General: She is not in acute distress.    Appearance: She is not toxic-appearing.  HENT:     Head: Normocephalic.     Right Ear: Tympanic membrane, ear canal and external ear normal.     Left Ear: Tympanic membrane, ear canal and external ear normal.     Nose: Nose normal.     Mouth/Throat:     Mouth: Mucous membranes are moist.     Pharynx: No oropharyngeal exudate or posterior oropharyngeal erythema.  Eyes:     General: No scleral icterus.    Extraocular Movements: Extraocular movements intact.     Conjunctiva/sclera: Conjunctivae normal.  Neck:     Musculoskeletal: Neck supple. No neck rigidity.  Cardiovascular:     Rate and Rhythm: Normal rate and regular rhythm.  Pulmonary:     Effort: Pulmonary effort is normal. No respiratory distress.     Breath sounds: Wheezing and rhonchi present. No rales.  Lymphadenopathy:     Cervical: No cervical adenopathy.  Skin:    General: Skin is warm and dry.     Coloration: Skin is not jaundiced or pale.  Neurological:     Mental Status: She is alert and oriented to person, place, and time.  Psychiatric:        Mood and Affect: Mood normal.        Behavior: Behavior normal.     Vitals:   04/04/18 1112  BP: 138/80  Pulse: 87  Resp: 16  Temp: 98.2 F (36.8 C)  SpO2: 98%      Assessment & Plan:   Asthma with exacerbation, suspected respiratory infection  - patient will stop doxycycline as it does not seem to be effective.  She will start Levaquin course and do another steroid taper.  She will continue use of bilateral her nebulizer treatment at home and inhaler when out of the house.  We will do chest x-ray in clinic today.  Methylprednisolone IM 80 mg given in clinic x1  Administrations This Visit    methylPREDNISolone acetate (DEPO-MEDROL) injection 80 mg    Admin Date 04/04/2018 Action Given Dose 80 mg Route Intramuscular Administered By Neta Ehlers, RMA         Advised that she is not feeling better by end of the week to let us know right away.  Also advised if her breathing symptoms worsen to call office and or go to emergency room right away for evaluation.  Offered patient to make follow-up, but states she has difficult time getting away with her work and will let us know if she needs to return to clinic for reevaluation.

## 2018-04-06 ENCOUNTER — Telehealth: Payer: Self-pay | Admitting: Internal Medicine

## 2018-04-06 NOTE — Telephone Encounter (Signed)
Xray is negative for a pnuemonia. Does show lower lung volumes, which could be related to asthma exacerbation. I would finish the steroids as prescribed and levaquin as prescribed.  How is she feeling? Any better?

## 2018-04-06 NOTE — Telephone Encounter (Signed)
Pt called [facility] Patient was seen by Philis Nettle on 2/17. Requesting x-ray results. Janett Billow is out of office. Routing to [place] to notify or x-ray results

## 2018-04-06 NOTE — Telephone Encounter (Signed)
Patient was seen by Philis Nettle on 2/17. Requesting x-ray results. Janett Billow is out of office. Routing to [place] to notify or x-ray results

## 2018-04-06 NOTE — Telephone Encounter (Signed)
Pt is calling for xray results. No result note found.

## 2018-04-06 NOTE — Telephone Encounter (Signed)
Called Pt with results and Pt stated she saw her results on MyChart. Pt stated today is the first day that she is started feeling a little bit better.

## 2018-05-06 ENCOUNTER — Other Ambulatory Visit: Payer: Self-pay | Admitting: Internal Medicine

## 2018-05-06 MED FILL — ESCITALOPRAM 10 MG TABLET: 10 | 90 days supply | Qty: 90 | Fill #1

## 2018-05-09 NOTE — Telephone Encounter (Signed)
Refilled: 11/22/2017 Last OV: 10/28/2017 Next OV: 11/03/2018

## 2018-05-14 IMAGING — DX DG FOOT COMPLETE 3+V*R*
3 series · 3 of 3 positions shown · non-contrast
Comparison: None.

CLINICAL DATA: Right toe pain.  Heel pain.

EXAM:
RIGHT FOOT COMPLETE - 3+ VIEW

[foot ap]
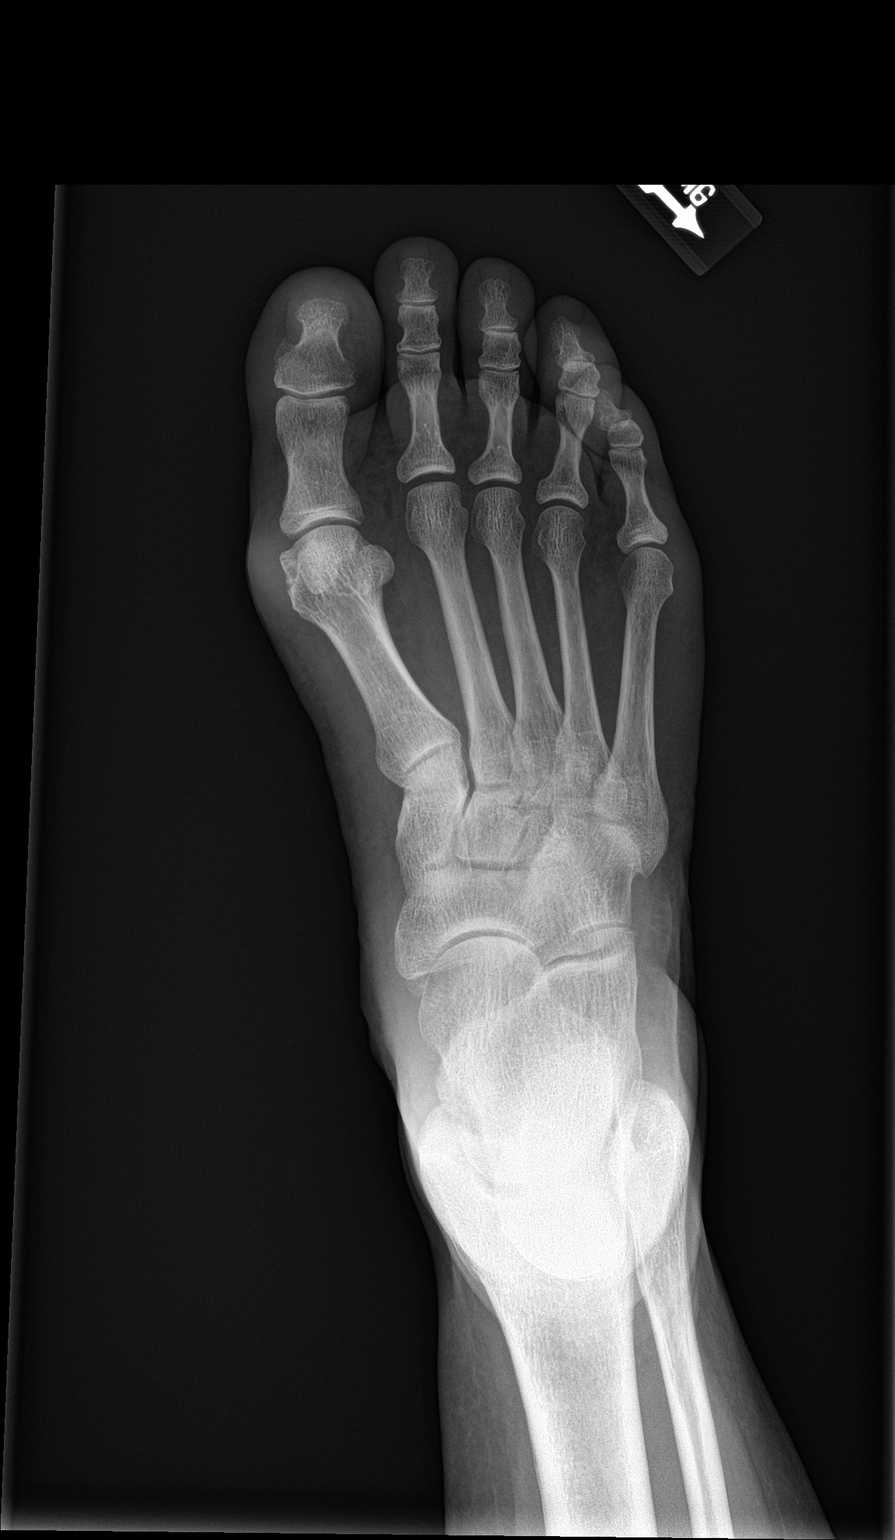

[foot obl]
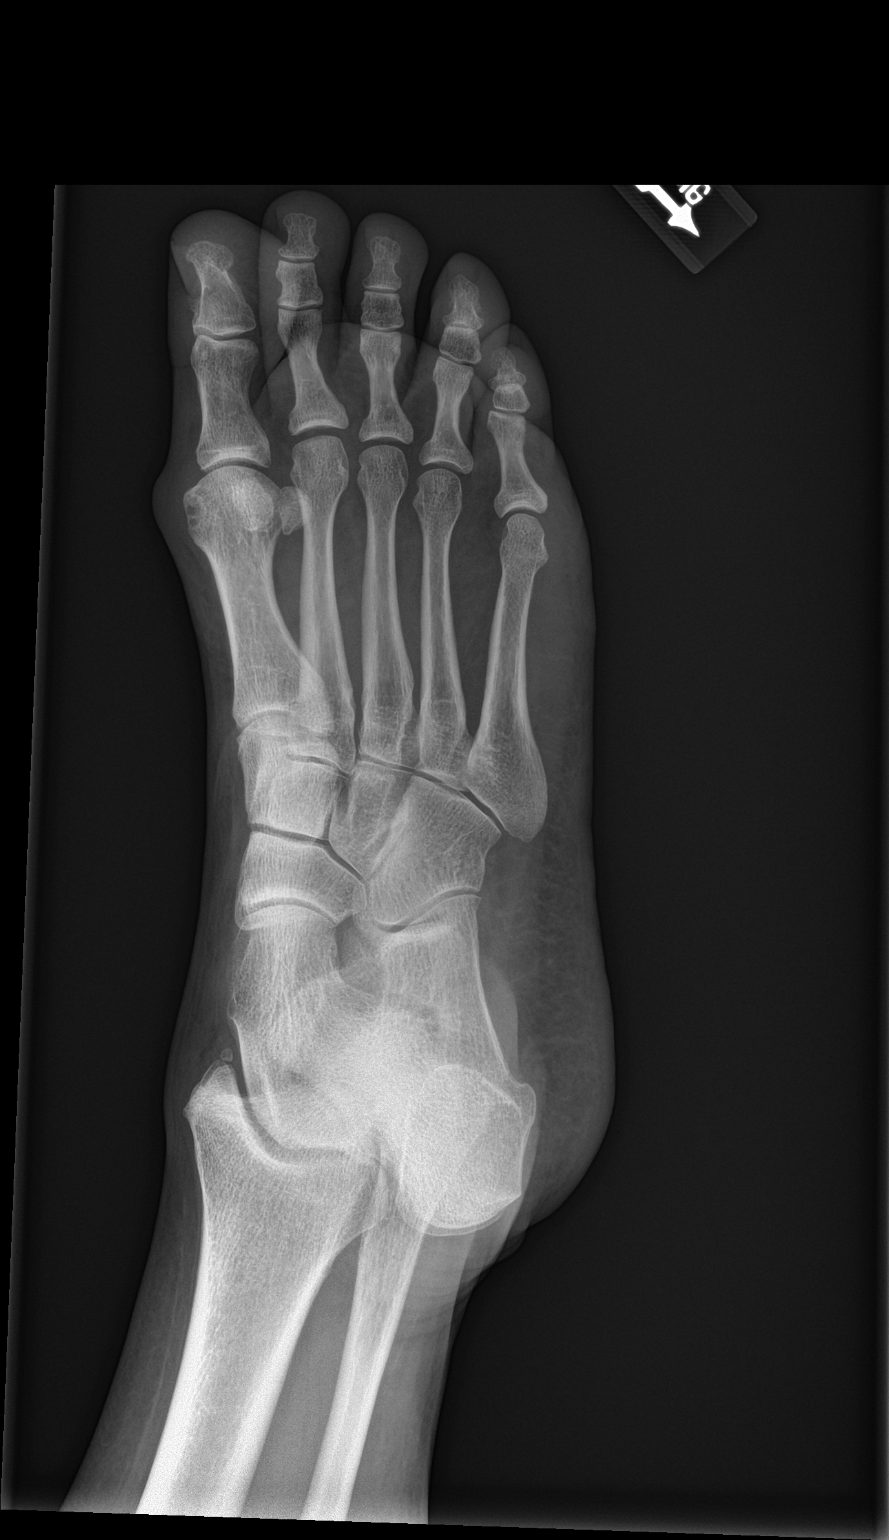

[foot lat]
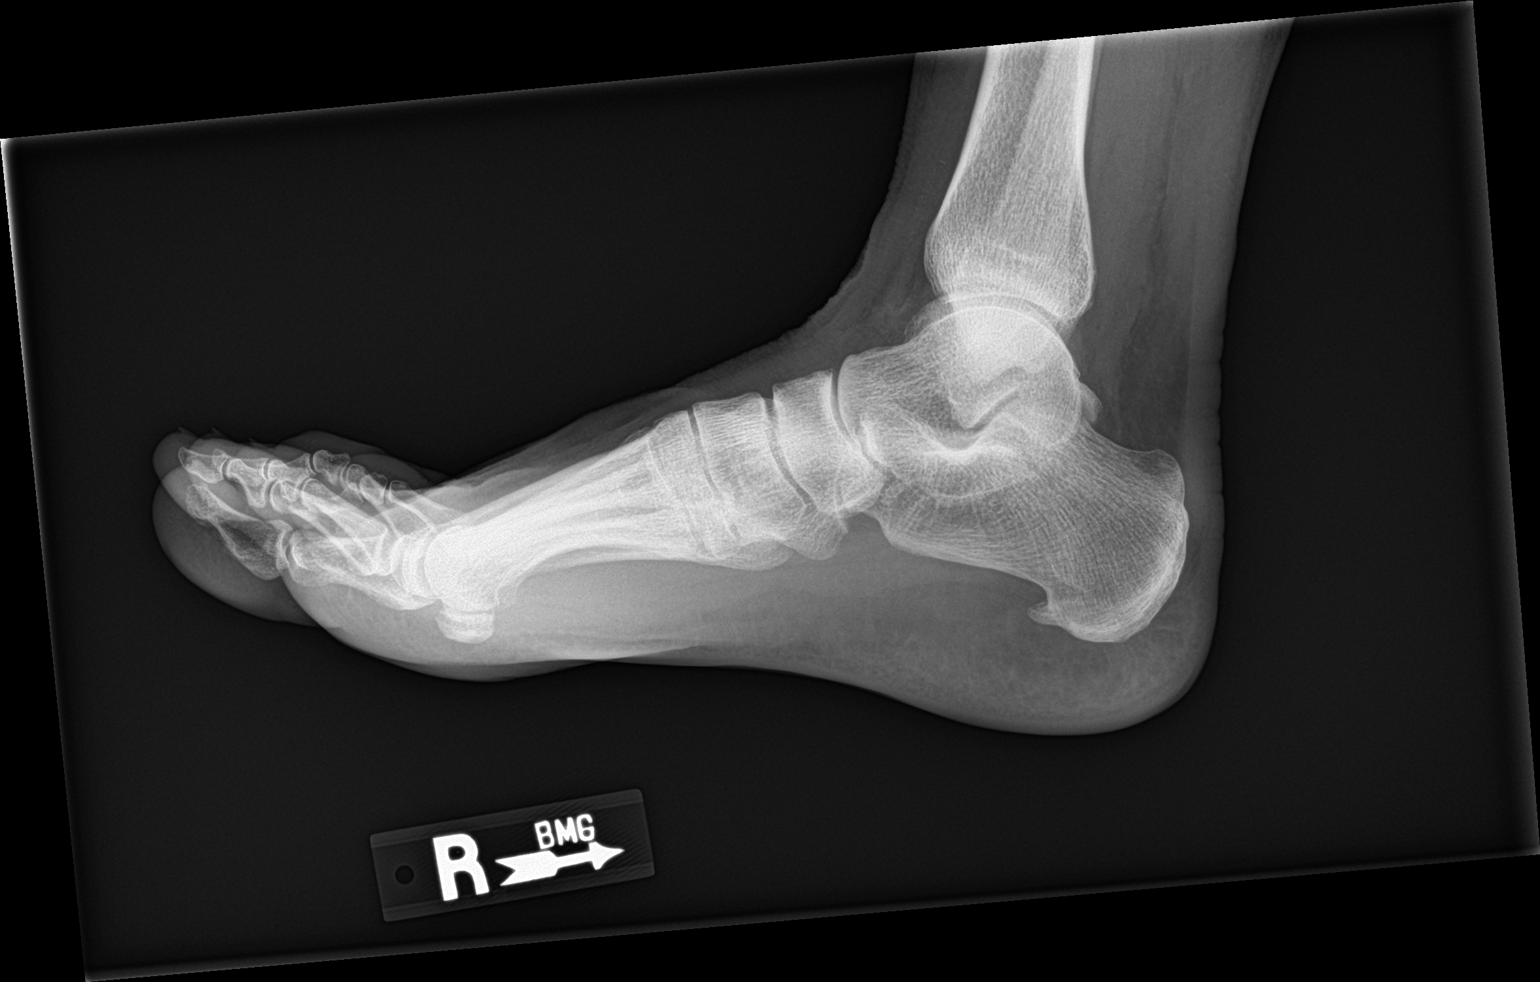

[3 of 3 positions shown; findings below may reference images not displayed]

FINDINGS: No fracture.

The joints are normally spaced and aligned.  No arthropathic change.

There is bony prominence from the medial head of the first
metatarsal consistent with a bunion.

Small plantar calcaneal spur.

Normal soft tissues.
IMPRESSION: 1. No fracture, dislocation or significant arthropathic change.
2. Bony prominence from the medial first metatarsal head reflecting
a bunion.
3. Small plantar calcaneal spur.

## 2018-06-30 ENCOUNTER — Other Ambulatory Visit: Payer: Self-pay | Admitting: Internal Medicine

## 2018-06-30 MED ORDER — PHENTERMINE HCL 37.5 MG PO TABS: ORAL_TABLET | ORAL | 0 refills | Status: DC

## 2018-06-30 MED FILL — PHENTERMINE 37.5 MG TABLET: 37.5 | 30 days supply | Qty: 30 | Fill #0

## 2018-07-08 ENCOUNTER — Ambulatory Visit: Payer: No Typology Code available for payment source | Admitting: Internal Medicine

## 2018-08-05 ENCOUNTER — Other Ambulatory Visit: Payer: Self-pay

## 2018-08-05 ENCOUNTER — Ambulatory Visit (INDEPENDENT_AMBULATORY_CARE_PROVIDER_SITE_OTHER): Payer: No Typology Code available for payment source | Admitting: Internal Medicine

## 2018-08-05 ENCOUNTER — Encounter: Payer: Self-pay | Admitting: Internal Medicine

## 2018-08-05 DIAGNOSIS — E034 Atrophy of thyroid (acquired): Secondary | ICD-10-CM | POA: Diagnosis not present

## 2018-08-05 DIAGNOSIS — K760 Fatty (change of) liver, not elsewhere classified: Secondary | ICD-10-CM

## 2018-08-05 DIAGNOSIS — E669 Obesity, unspecified: Secondary | ICD-10-CM | POA: Diagnosis not present

## 2018-08-05 MED ORDER — PHENTERMINE HCL 37.5 MG PO TABS: ORAL_TABLET | ORAL | 2 refills | Status: DC

## 2018-08-05 MED ORDER — CYANOCOBALAMIN 1000 MCG/ML IJ SOLN: 1000.0000 ug | INTRAMUSCULAR | 5 refills | Status: DC

## 2018-08-05 MED ORDER — "SYRINGE 25G X 1"" 3 ML MISC": 0 refills | Status: DC

## 2018-08-05 MED FILL — BD 3 ML SYRINGE 25GX1": 25G X 1" | 6 days supply | Qty: 6 | Fill #0

## 2018-08-05 MED FILL — CYANOCOBALAMIN 1,000 MCG/ML: 1000 | 30 days supply | Qty: 1 | Fill #0

## 2018-08-05 MED FILL — PHENTERMINE 37.5 MG TABLET: 37.5 | 30 days supply | Qty: 30 | Fill #0

## 2018-08-05 MED FILL — BD 3 ML SYRINGE 25GX1: 25G X 1" | 6 days supply | Qty: 6 | Fill #0

## 2018-08-05 NOTE — Progress Notes (Signed)
Virtual Visit via [email or web address]  This visit type was conducted due to national recommendations for restrictions regarding the COVID-19 pandemic (e.g. social distancing).  This format is felt to be most appropriate for this patient at this time.  All issues noted in this document were discussed and addressed.  No physical exam was performed (except for noted visual exam findings with Video Visits).   I connected with@ on 08/05/18 at  3:00 PM EDT by a video enabled telemedicine application or telephone and verified that I am speaking with the correct person using two identifiers. Location patient: home Location provider: work or home office Persons participating in the virtual visit: patient, provider  I discussed the limitations, risks, security and privacy concerns of performing an evaluation and management service by telephone and the availability of in person appointments. I also discussed with the patient that there may be a patient responsible charge related to this service. The patient expressed understanding and agreed to proceed.  Reason for visit: follow up on pharmacotherapy for obiesty  Has been taking phentermine for the past month and lost 8 lbs  fro m 202 to 194. Walking daily for exerise a minimum of 5 days per week   HPI:  58 yr old female history of hypothyroidism , NASH, obesity presents for follow up on recent  Repeat initiation of phentermine for pharmacotherapy. Patient has been taking 37.5 mg daily since May 15 and has lost 8 lbs.  She is walking daily with a companion for exercise.  She is alos physically laboring trying to prepare her house for sale. She has no side effects from the medication and  Would like to continue taking it for 3 more months.  She is following a low glycemic index diet.  She has been talking to a friend who is under the care of a bariatric clinic and is receiving B12 injections,  With improvements in energy level.  She would like to add B12 injections and  understands that she may have to pay out of pocket. She belliees she can ask an Therapist, sports at her office to administer monthly.   NASH: she is avoiding excessive use of tylenol and alcohol ,  following a low GI diet and actively trying to lose weight    ROS: Patient denies headache, fevers, malaise, unintentional weight loss, skin rash, eye pain, sinus congestion and sinus pain, sore throat, dysphagia,  hemoptysis , cough, dyspnea, wheezing, chest pain, palpitations, orthopnea, edema, abdominal pain, nausea, melena, diarrhea, constipation, flank pain, dysuria, hematuria, urinary  Frequency, nocturia, numbness, tingling, seizures,  Focal weakness, Loss of consciousness,  Tremor, insomnia, depression, anxiety, and suicidal ideation.      Past Medical History:  Diagnosis Date  . Asthma   . hypothyroid   . Recurrent urinary tract infection     Past Surgical History:  Procedure Laterality Date  . CESAREAN SECTION      Family History  Problem Relation Age of Onset  . Asthma Mother   . Diabetes Father   . Heart disease Father        chf,  no history of AMI  . Hyperlipidemia Father   . Arthritis Sister   . Cancer Sister 11       breast cancer , second diagnosis at 68  . Asthma Brother   . Stroke Maternal Aunt   . Cancer Maternal Aunt        breast ca    SOCIAL HX:  reports that she has never smoked. She has  never used smokeless tobacco. She reports current alcohol use. She reports that she does not use drugs.   Current Outpatient Medications:  .  albuterol (PROVENTIL HFA;VENTOLIN HFA) 108 (90 Base) MCG/ACT inhaler, Inhale 2 puffs into the lungs every 6 (six) hours as needed for wheezing or shortness of breath., Disp: 1 Inhaler, Rfl: 3 .  albuterol (PROVENTIL) (2.5 MG/3ML) 0.083% nebulizer solution, Take 3 mLs (2.5 mg total) by nebulization every 6 (six) hours as needed for wheezing or shortness of breath., Disp: 45 mL, Rfl: 1 .  Calcium-Vitamin D-Vitamin K (VIACTIV PO), Take by mouth.,  Disp: , Rfl:  .  escitalopram (LEXAPRO) 10 MG tablet, Take 1 tablet (10 mg total) by mouth daily., Disp: 90 tablet, Rfl: 1 .  fluticasone (FLONASE) 50 MCG/ACT nasal spray, Place 1 spray into both nostrils daily., Disp: , Rfl:  .  Fluticasone-Salmeterol (ADVAIR DISKUS) 500-50 MCG/DOSE AEPB, Inhale 1 puff 2 (two) times daily into the lungs. RINSE MOUTH AFTER EACH USE TO PREVENT THRUSH, Disp: 60 each, Rfl: 5 .  levothyroxine (SYNTHROID, LEVOTHROID) 25 MCG tablet, TAKE 1 TABLET BY MOUTH DAILY BEFORE BREAKFAST., Disp: 90 tablet, Rfl: 0 .  Multiple Vitamin (MULTIVITAMIN) tablet, Take 1 tablet by mouth daily., Disp: , Rfl:  .  ondansetron (ZOFRAN ODT) 4 MG disintegrating tablet, Take 1 tablet (4 mg total) by mouth every 8 (eight) hours as needed for nausea., Disp: 20 tablet, Rfl: 0 .  phentermine (ADIPEX-P) 37.5 MG tablet, TAKE 1/2 TABLET BY MOUTH IN THE MORNING AND EARLY AFTERNOON, Disp: 30 tablet, Rfl: 2 .  cyanocobalamin (,VITAMIN B-12,) 1000 MCG/ML injection, Inject 1 mL (1,000 mcg total) into the muscle every 30 (thirty) days., Disp: 1 mL, Rfl: 5 .  Syringe/Needle, Disp, (SYRINGE 3CC/25GX1") 25G X 1" 3 ML MISC, Use for b12 injections, Disp: 6 each, Rfl: 0  EXAM:  VITALS per patient if applicable:  GENERAL: alert, oriented, appears well and in no acute distress  HEENT: atraumatic, conjunttiva clear, no obvious abnormalities on inspection of external nose and ears  NECK: normal movements of the head and neck  LUNGS: on inspection no signs of respiratory distress, breathing rate appears normal, no obvious gross SOB, gasping or wheezing  CV: no obvious cyanosis  MS: moves all visible extremities without noticeable abnormality  PSYCH/NEURO: pleasant and cooperative, no obvious depression or anxiety, speech and thought processing grossly intact  ASSESSMENT AND PLAN:  Fatty liver disease, nonalcoholic Presumed by ultrasound changes and serologies negative for autoimmune causes of hepatitis.   Current liver enzymes are due and all modifiable risk factors including obesity  and hyperlipidemia have been addressed . Hepatitis A/B vaccine status needs confirmation with ab levels and have been ordered    Lab Results  Component Value Date   ALT 32 10/28/2017   AST 35 10/28/2017   ALKPHOS 86 10/28/2017   BILITOT 0.4 10/28/2017        Hypothyroidism She is taking 25 mcg daily and is due for thyroid level.  Obesity (BMI 30-39.9) She has been inconsistent with use of phentermine , but when using it has lost 8 lbs in 4 weeks with Low GI diet and exercise. She has no side effects from the medication.  Risks and benefits of medication use discussed and refilled for daily use     I discussed the assessment and treatment plan with the patient. The patient was provided an opportunity to ask questions and all were answered. The patient agreed with the plan and demonstrated an understanding of  the instructions.   The patient was advised to call back or seek an in-person evaluation if the symptoms worsen or if the condition fails to improve as anticipated.  I provided 25 minutes of non-face-to-face time during this encounter.   Crecencio Mc, MD

## 2018-08-07 NOTE — Assessment & Plan Note (Signed)
She has been inconsistent with use of phentermine , but when using it has lost 8 lbs in 4 weeks with Low GI diet and exercise. She has no side effects from the medication.  Risks and benefits of medication use discussed and refilled for daily use

## 2018-08-07 NOTE — Assessment & Plan Note (Signed)
She is taking 25 mcg daily and is due for thyroid level.

## 2018-08-07 NOTE — Assessment & Plan Note (Addendum)
Presumed by ultrasound changes and serologies negative for autoimmune causes of hepatitis.  Current liver enzymes are due and all modifiable risk factors including obesity  and hyperlipidemia have been addressed . Hepatitis A/B vaccine status needs confirmation with ab levels and have been ordered    Lab Results  Component Value Date   ALT 32 10/28/2017   AST 35 10/28/2017   ALKPHOS 86 10/28/2017   BILITOT 0.4 10/28/2017

## 2018-09-06 ENCOUNTER — Other Ambulatory Visit: Payer: Self-pay | Admitting: Internal Medicine

## 2018-09-06 MED FILL — PHENTERMINE 37.5 MG TABLET: 37.5 | 30 days supply | Qty: 30 | Fill #1

## 2018-09-06 MED FILL — ESCITALOPRAM 10 MG TABLET: 10 | 90 days supply | Qty: 90 | Fill #0

## 2018-09-06 MED FILL — CYANOCOBALAMIN 1,000 MCG/ML: 1000 | 30 days supply | Qty: 1 | Fill #1

## 2018-10-17 MED FILL — PHENTERMINE 37.5 MG TABLET: 37.5 | 30 days supply | Qty: 30 | Fill #2

## 2018-11-01 ENCOUNTER — Other Ambulatory Visit: Payer: Self-pay

## 2018-11-03 ENCOUNTER — Ambulatory Visit (INDEPENDENT_AMBULATORY_CARE_PROVIDER_SITE_OTHER): Payer: No Typology Code available for payment source | Admitting: Internal Medicine

## 2018-11-03 ENCOUNTER — Other Ambulatory Visit: Payer: Self-pay

## 2018-11-03 ENCOUNTER — Encounter: Payer: Self-pay | Admitting: Internal Medicine

## 2018-11-03 VITALS — BP 120/86 | HR 85 | Temp 97.6°F | Resp 15 | Ht 64.0 in | Wt 189.8 lb

## 2018-11-03 DIAGNOSIS — Z0000 Encounter for general adult medical examination without abnormal findings: Secondary | ICD-10-CM

## 2018-11-03 DIAGNOSIS — J683 Other acute and subacute respiratory conditions due to chemicals, gases, fumes and vapors: Secondary | ICD-10-CM

## 2018-11-03 DIAGNOSIS — D708 Other neutropenia: Secondary | ICD-10-CM

## 2018-11-03 DIAGNOSIS — E034 Atrophy of thyroid (acquired): Secondary | ICD-10-CM | POA: Diagnosis not present

## 2018-11-03 DIAGNOSIS — R7303 Prediabetes: Secondary | ICD-10-CM

## 2018-11-03 DIAGNOSIS — J4520 Mild intermittent asthma, uncomplicated: Secondary | ICD-10-CM

## 2018-11-03 DIAGNOSIS — Z23 Encounter for immunization: Secondary | ICD-10-CM | POA: Diagnosis not present

## 2018-11-03 DIAGNOSIS — K760 Fatty (change of) liver, not elsewhere classified: Secondary | ICD-10-CM

## 2018-11-03 DIAGNOSIS — E669 Obesity, unspecified: Secondary | ICD-10-CM

## 2018-11-03 DIAGNOSIS — Z8709 Personal history of other diseases of the respiratory system: Secondary | ICD-10-CM | POA: Diagnosis not present

## 2018-11-03 MED ORDER — PHENTERMINE HCL 37.5 MG PO TABS: ORAL_TABLET | ORAL | 2 refills | Status: DC

## 2018-11-03 MED FILL — PHENTERMINE 37.5 MG TABLET: 37.5 | 30 days supply | Qty: 30 | Fill #0

## 2018-11-03 NOTE — Progress Notes (Signed)
Patient ID: Samantha Raymond, female    DOB: [birth year]  Age: 58 y.o. MRN: [id number]  The patient is here for annual  preventive  examination and management of other chronic and acute problems.   The risk factors are reflected in the social history.  The roster of all physicians providing medical care to patient - is listed in the Snapshot section of the chart.  Activities of daily living:  The patient is 100% independent in all ADLs: dressing, toileting, feeding as well as independent mobility  Home safety : The patient has smoke detectors in the home. They wear seatbelts.  There are no firearms at home. There is no violence in the home.   There is no risks for hepatitis, STDs or HIV. There is no   history of blood transfusion. They have no travel history to infectious disease endemic areas of the world.  The patient has seen their dentist in the last six month. They have seen their eye doctor in the last year. They deny any hearing difficulty with regard to whispered voices and some television programs.  They have deferred audiologic testing in the last year.  They do not  have excessive sun exposure. Discussed the need for sun protection: hats, long sleeves and use of sunscreen if there is significant sun exposure.   Diet: the importance of a healthy diet is discussed. They do have a healthy diet.  The benefits of regular aerobic exercise were discussed. She has not been  exercising due to her workload .  Depression screen: there are no signs or vegative symptoms of depression-  change in appetite, anhedonia, but does endorse anxiety and irritability due to the stress of her job and the difficulties she encounters daily.   The following portions of the patient's history were reviewed and updated as appropriate: allergies, current medications, past family history, past medical history,  past surgical history, past social history  and problem list.  Visual acuity was not assessed per  patient preference since she has regular follow up with her ophthalmologist. Hearing and body mass index were assessed and reviewed.   During the course of the visit the patient was educated and counseled about appropriate screening and preventive services including : fall prevention , diabetes screening, nutrition counseling, colorectal cancer screening, and recommended immunizations.    CC: The primary encounter diagnosis was Visit for preventive health examination. Diagnoses of Prediabetes, Hypothyroidism due to acquired atrophy of thyroid, Fatty liver disease, nonalcoholic, Mild intermittent asthma, unspecified whether complicated, High Priority for Covid-19 Virus Vaccination, Need for immunization against influenza, Other neutropenia ([place]), Reactive airways dysfunction syndrome, moderate persistent, uncomplicated (HCC), History of URI (upper respiratory infection), and Obesity (BMI 30-39.9) were also pertinent to this visit.  History of asthma:  She has not had any recent exacerbations, but She had a prolonged lower respiratory illness in February that was "the sickest she has ever been" and is requesting testing for past COVID 19 infection.  Discussed the implications and accuracy of the COVID 19 antibody test with patient and the lack of any definitive advice that can be given regarding the results except that continued safe distancing and masking should be done.   Obesity:  She has had a 16 lb wt loss since June with daily use of  Phentermine.  Diet and exercise history reviewed in detail .  History Samantha Raymond has a past medical history of Asthma, hypothyroid, and Recurrent urinary tract infection.   She has a past surgical history that  includes Cesarean section.   Her family history includes Arthritis in her sister; Asthma in her brother and mother; Cancer in her maternal aunt; Cancer (age of onset: 56) in her sister; Diabetes in her father; Heart disease in her father; Hyperlipidemia in her  father; Stroke in her maternal aunt.She reports that she has never smoked. She has never used smokeless tobacco. She reports current alcohol use. She reports that she does not use drugs.  Outpatient Medications Prior to Visit  Medication Sig Dispense Refill  . albuterol (PROVENTIL HFA;VENTOLIN HFA) 108 (90 Base) MCG/ACT inhaler Inhale 2 puffs into the lungs every 6 (six) hours as needed for wheezing or shortness of breath. 1 Inhaler 3  . albuterol (PROVENTIL) (2.5 MG/3ML) 0.083% nebulizer solution Take 3 mLs (2.5 mg total) by nebulization every 6 (six) hours as needed for wheezing or shortness of breath. 45 mL 1  . Calcium-Vitamin D-Vitamin K (VIACTIV PO) Take by mouth.    . cyanocobalamin (,VITAMIN B-12,) 1000 MCG/ML injection Inject 1 mL (1,000 mcg total) into the muscle every 30 (thirty) days. 1 mL 5  . escitalopram (LEXAPRO) 10 MG tablet TAKE 1 TABLET BY MOUTH ONCE DAILY 90 tablet 1  . Fluticasone-Salmeterol (ADVAIR DISKUS) 500-50 MCG/DOSE AEPB Inhale 1 puff 2 (two) times daily into the lungs. RINSE MOUTH AFTER EACH USE TO PREVENT THRUSH 60 each 5  . levothyroxine (SYNTHROID, LEVOTHROID) 25 MCG tablet TAKE 1 TABLET BY MOUTH DAILY BEFORE BREAKFAST. 90 tablet 0  . Multiple Vitamin (MULTIVITAMIN) tablet Take 1 tablet by mouth daily.    . ondansetron (ZOFRAN ODT) 4 MG disintegrating tablet Take 1 tablet (4 mg total) by mouth every 8 (eight) hours as needed for nausea. 20 tablet 0  . Syringe/Needle, Disp, (SYRINGE 3CC/25GX1") 25G X 1" 3 ML MISC Use for b12 injections 6 each 0  . phentermine (ADIPEX-P) 37.5 MG tablet TAKE 1/2 TABLET BY MOUTH IN THE MORNING AND EARLY AFTERNOON 30 tablet 2  . fluticasone (FLONASE) 50 MCG/ACT nasal spray Place 1 spray into both nostrils daily.     No facility-administered medications prior to visit.     Review of Systems   Patient denies headache, fevers, malaise, unintentional weight loss, skin rash, eye pain, sinus congestion and sinus pain, sore throat,  dysphagia,  hemoptysis , cough, dyspnea, wheezing, chest pain, palpitations, orthopnea, edema, abdominal pain, nausea, melena, diarrhea, constipation, flank pain, dysuria, hematuria, urinary  Frequency, nocturia, numbness, tingling, seizures,  Focal weakness, Loss of consciousness,  Tremor, insomnia, depression, anxiety, and suicidal ideation.      Objective:  BP 120/86 (BP Location: Left Arm, Patient Position: Sitting, Cuff Size: Normal)   Pulse 85   Temp 97.6 F (36.4 C) (Temporal)   Resp 15   Ht 5\' 4"  (1.626 m)   Wt 189 lb 12.8 oz (86.1 kg)   SpO2 98%   BMI 32.58 kg/m   Physical Exam   General appearance: alert, cooperative and appears stated age Head: Normocephalic, without obvious abnormality, atraumatic Eyes: conjunctivae/corneas clear. PERRL, EOM's intact. Fundi benign. Ears: normal TM's and external ear canals both ears Nose: Nares normal. Septum midline. Mucosa normal. No drainage or sinus tenderness. Throat: lips, mucosa, and tongue normal; teeth and gums normal Neck: no adenopathy, no carotid bruit, no JVD, supple, symmetrical, trachea midline and thyroid not enlarged, symmetric, no tenderness/mass/nodules Lungs: clear to auscultation bilaterally Breasts: normal appearance, no masses or tenderness Heart: regular rate and rhythm, S1, S2 normal, no murmur, click, rub or gallop Abdomen: soft, non-tender; bowel sounds  normal; no masses,  no organomegaly Extremities: extremities normal, atraumatic, no cyanosis or edema Pulses: 2+ and symmetric Skin: Skin color, texture, turgor normal. No rashes or lesions Neurologic: Alert and oriented X 3, normal strength and tone. Normal symmetric reflexes. Normal coordination and gait.    Assessment & Plan:   Problem List Items Addressed This Visit      Unprioritized   RESOLVED: Neutropenia ([place])   Obesity (BMI 30-39.9)    She has been  More consistent with use of phentermine , and has lost 16 lbs since June with Low GI diet but has  not been participating in regular  exercise. She has no side effects from the medication.  Risks and benefits of medication use discussed and refilled for daily use       Relevant Medications   phentermine (ADIPEX-P) 37.5 MG tablet   Hypothyroidism    She is taking 25 mcg daily .  Thyroid function is WNL on current dose.  No current changes needed.   Lab Results  Component Value Date   TSH 0.883 11/03/2018         Relevant Orders   TSH (Completed)   Visit for preventive health examination - Primary    age appropriate education and counseling updated, referrals for preventative services and immunizations addressed, dietary and smoking counseling addressed, most recent labs reviewed.  I have personally reviewed and have noted:  1) the patient's medical and social history 2) The pt's use of alcohol, tobacco, and illicit drugs 3) The patient's current medications and supplements 4) Functional ability including ADL's, fall risk, home safety risk, hearing and visual impairment 5) Diet and physical activities 6) Evidence for depression or mood disorder 7) The patient's height, weight, and BMI have been recorded in the chart  I have made referrals, and provided counseling and education based on review of the above      Fatty liver disease, nonalcoholic      Current liver enzymes are due and all modifiable risk factors including obesity  and hyperlipidemia have been addressed .   Lab Results  Component Value Date   ALT 32 11/03/2018   AST 40 11/03/2018   ALKPHOS 81 11/03/2018   BILITOT 0.5 11/03/2018            Relevant Orders   Comprehensive metabolic panel (Completed)   RESOLVED: Airway hyperreactivity   Relevant Orders   CBC with Differential/Platelet (Completed)   Prediabetes    Her fasting glucose is not diagnostic of diabetes .  I recommend she follow a low glycemic index diet and particpate regularly in an aerobic  exercise activity.  We should check an A1c in 6  months.   Lab Results  Component Value Date   HGBA1C 5.8 (H) 11/03/2018         Relevant Orders   Hemoglobin A1c (Completed)   Reactive airways dysfunction syndrome, moderate persistent, uncomplicated (HCC)    No Recent asthma exacerbations since February  2020  Does not use daily inhaled therapy       History of URI (upper respiratory infection)    She had a prolonged lower respiratory illness in February that was "the sickest she has ever been" and is requesting testing for past COVID 19 infection.  Discussed the implications and accuracy of the COVID 19 antibody test with patient and the lack of any definitive advice that can be given regarding the results except that continued safe distancing and masking should be done.  Other Visit Diagnoses    High Priority for Covid-19 Virus Vaccination       Relevant Orders   SAR CoV2 Serology (COVID 19)AB(IGG)IA (Completed)   Need for immunization against influenza       Relevant Orders   Flu Vaccine QUAD 36+ mos IM (Completed)      I am having Samantha Raymond maintain her Calcium-Vitamin D-Vitamin K (VIACTIV PO), multivitamin, Fluticasone-Salmeterol, ondansetron, fluticasone, levothyroxine, albuterol, albuterol, SYRINGE 3CC/25GX1", cyanocobalamin, escitalopram, and phentermine.  Meds ordered this encounter  Medications  . phentermine (ADIPEX-P) 37.5 MG tablet    Sig: TAKE 1/2 TABLET BY MOUTH IN THE MORNING AND EARLY AFTERNOON    Dispense:  30 tablet    Refill:  2    Medications Discontinued During This Encounter  Medication Reason  . phentermine (ADIPEX-P) 37.5 MG tablet Reorder    Follow-up: No follow-ups on file.   Crecencio Mc, MD

## 2018-11-03 NOTE — Patient Instructions (Signed)
I have refilled the phentermine at the prior dose  Your annual mammogram has been ordered.     Health Maintenance for Postmenopausal Women Menopause is a normal process in which your ability to get pregnant comes to an end. This process happens slowly over many months or years, usually between the ages of 44 and 19. Menopause is complete when you have missed your menstrual periods for 12 months. It is important to talk with your health care provider about some of the most common conditions that affect women after menopause (postmenopausal women). These include heart disease, cancer, and bone loss (osteoporosis). Adopting a healthy lifestyle and getting preventive care can help to promote your health and wellness. The actions you take can also lower your chances of developing some of these common conditions. What should I know about menopause? During menopause, you may get a number of symptoms, such as:  Hot flashes. These can be moderate or severe.  Night sweats.  Decrease in sex drive.  Mood swings.  Headaches.  Tiredness.  Irritability.  Memory problems.  Insomnia. Choosing to treat or not to treat these symptoms is a decision that you make with your health care provider. Do I need hormone replacement therapy?  Hormone replacement therapy is effective in treating symptoms that are caused by menopause, such as hot flashes and night sweats.  Hormone replacement carries certain risks, especially as you become older. If you are thinking about using estrogen or estrogen with progestin, discuss the benefits and risks with your health care provider. What is my risk for heart disease and stroke? The risk of heart disease, heart attack, and stroke increases as you age. One of the causes may be a change in the body's hormones during menopause. This can affect how your body uses dietary fats, triglycerides, and cholesterol. Heart attack and stroke are medical emergencies. There are many  things that you can do to help prevent heart disease and stroke. Watch your blood pressure  High blood pressure causes heart disease and increases the risk of stroke. This is more likely to develop in people who have high blood pressure readings, are of African descent, or are overweight.  Have your blood pressure checked: ? Every 3-5 years if you are 87-57 years of age. ? Every year if you are 39 years old or older. Eat a healthy diet   Eat a diet that includes plenty of vegetables, fruits, low-fat dairy products, and lean protein.  Do not eat a lot of foods that are high in solid fats, added sugars, or sodium. Get regular exercise Get regular exercise. This is one of the most important things you can do for your health. Most adults should:  Try to exercise for at least 150 minutes each week. The exercise should increase your heart rate and make you sweat (moderate-intensity exercise).  Try to do strengthening exercises at least twice each week. Do these in addition to the moderate-intensity exercise.  Spend less time sitting. Even light physical activity can be beneficial. Other tips  Work with your health care provider to achieve or maintain a healthy weight.  Do not use any products that contain nicotine or tobacco, such as cigarettes, e-cigarettes, and chewing tobacco. If you need help quitting, ask your health care provider.  Know your numbers. Ask your health care provider to check your cholesterol and your blood sugar (glucose). Continue to have your blood tested as directed by your health care provider. Do I need screening for cancer? Depending on  your health history and family history, you may need to have cancer screening at different stages of your life. This may include screening for:  Breast cancer.  Cervical cancer.  Lung cancer.  Colorectal cancer. What is my risk for osteoporosis? After menopause, you may be at increased risk for osteoporosis. Osteoporosis is  a condition in which bone destruction happens more quickly than new bone creation. To help prevent osteoporosis or the bone fractures that can happen because of osteoporosis, you may take the following actions:  If you are 54-45 years old, get at least 1,000 mg of calcium and at least 600 mg of vitamin D per day.  If you are older than age 42 but younger than age 103, get at least 1,200 mg of calcium and at least 600 mg of vitamin D per day.  If you are older than age 47, get at least 1,200 mg of calcium and at least 800 mg of vitamin D per day. Smoking and drinking excessive alcohol increase the risk of osteoporosis. Eat foods that are rich in calcium and vitamin D, and do weight-bearing exercises several times each week as directed by your health care provider. How does menopause affect my mental health? Depression may occur at any age, but it is more common as you become older. Common symptoms of depression include:  Low or sad mood.  Changes in sleep patterns.  Changes in appetite or eating patterns.  Feeling an overall lack of motivation or enjoyment of activities that you previously enjoyed.  Frequent crying spells. Talk with your health care provider if you think that you are experiencing depression. General instructions See your health care provider for regular wellness exams and vaccines. This may include:  Scheduling regular health, dental, and eye exams.  Getting and maintaining your vaccines. These include: ? Influenza vaccine. Get this vaccine each year before the flu season begins. ? Pneumonia vaccine. ? Shingles vaccine. ? Tetanus, diphtheria, and pertussis (Tdap) booster vaccine. Your health care provider may also recommend other immunizations. Tell your health care provider if you have ever been abused or do not feel safe at home. Summary  Menopause is a normal process in which your ability to get pregnant comes to an end.  This condition causes hot flashes, night  sweats, decreased interest in sex, mood swings, headaches, or lack of sleep.  Treatment for this condition may include hormone replacement therapy.  Take actions to keep yourself healthy, including exercising regularly, eating a healthy diet, watching your weight, and checking your blood pressure and blood sugar levels.  Get screened for cancer and depression. Make sure that you are up to date with all your vaccines. This information is not intended to replace advice given to you by your health care provider. Make sure you discuss any questions you have with your health care provider. Document Released: 03/27/2005 Document Revised: 01/26/2018 Document Reviewed: 01/26/2018 Elsevier Patient Education  2020 [employer].

## 2018-11-04 LAB — COMPREHENSIVE METABOLIC PANEL
ALT: 32 IU/L (ref 0–32)
AST: 40 IU/L (ref 0–40)
Albumin/Globulin Ratio: 2.3 — ABNORMAL HIGH (ref 1.2–2.2)
Albumin: 4.8 g/dL (ref 3.8–4.9)
Alkaline Phosphatase: 81 IU/L (ref 39–117)
BUN/Creatinine Ratio: 18 (ref 9–23)
BUN: 15 mg/dL (ref 6–24)
Bilirubin Total: 0.5 mg/dL (ref 0.0–1.2)
CO2: 24 mmol/L (ref 20–29)
Calcium: 9.9 mg/dL (ref 8.7–10.2)
Chloride: 97 mmol/L (ref 96–106)
Creatinine, Ser: 0.83 mg/dL (ref 0.57–1.00)
GFR calc Af Amer: 89 mL/min/{1.73_m2} (ref >59)
GFR calc non Af Amer: 77 mL/min/{1.73_m2} (ref >59)
Globulin, Total: 2.1 g/dL (ref 1.5–4.5)
Glucose: 97 mg/dL (ref 65–99)
Potassium: 4.3 mmol/L (ref 3.5–5.2)
Sodium: 137 mmol/L (ref 134–144)
Total Protein: 6.9 g/dL (ref 6.0–8.5)

## 2018-11-04 LAB — HEMOGLOBIN A1C
Est. average glucose Bld gHb Est-mCnc: 120 mg/dL
Hgb A1c MFr Bld: 5.8 % — ABNORMAL HIGH (ref 4.8–5.6)

## 2018-11-04 LAB — CBC WITH DIFFERENTIAL/PLATELET
Basophils Absolute: 0 10*3/uL (ref 0.0–0.2)
Basos: 1 %
EOS (ABSOLUTE): 0 10*3/uL (ref 0.0–0.4)
Eos: 1 %
Hematocrit: 42.5 % (ref 34.0–46.6)
Hemoglobin: 14.8 g/dL (ref 11.1–15.9)
Immature Grans (Abs): 0 10*3/uL (ref 0.0–0.1)
Immature Granulocytes: 0 %
Lymphocytes Absolute: 1.5 10*3/uL (ref 0.7–3.1)
Lymphs: 35 %
MCH: 30.5 pg (ref 26.6–33.0)
MCHC: 34.8 g/dL (ref 31.5–35.7)
MCV: 87 fL (ref 79–97)
Monocytes Absolute: 0.4 10*3/uL (ref 0.1–0.9)
Monocytes: 10 %
Neutrophils Absolute: 2.2 10*3/uL (ref 1.4–7.0)
Neutrophils: 53 %
Platelets: 245 10*3/uL (ref 150–450)
RBC: 4.86 x10E6/uL (ref 3.77–5.28)
RDW: 12.7 % (ref 11.7–15.4)
WBC: 4.2 10*3/uL (ref 3.4–10.8)

## 2018-11-04 LAB — SAR COV2 SEROLOGY (COVID19)AB(IGG),IA: SARS-CoV-2 Ab, IgG: NEGATIVE

## 2018-11-04 LAB — TSH: TSH: 0.883 u[IU]/mL (ref 0.450–4.500)

## 2018-11-06 DIAGNOSIS — Z8709 Personal history of other diseases of the respiratory system: Secondary | ICD-10-CM | POA: Insufficient documentation

## 2018-11-06 DIAGNOSIS — J683 Other acute and subacute respiratory conditions due to chemicals, gases, fumes and vapors: Secondary | ICD-10-CM | POA: Insufficient documentation

## 2018-11-06 NOTE — Assessment & Plan Note (Addendum)
No Recent asthma exacerbations since February  2020  Does not use daily inhaled therapy

## 2018-11-06 NOTE — Assessment & Plan Note (Signed)
Her fasting glucose is not diagnostic of diabetes .  I recommend she follow a low glycemic index diet and particpate regularly in an aerobic  exercise activity.  We should check an A1c in 6 months.   Lab Results  Component Value Date   HGBA1C 5.8 (H) 11/03/2018

## 2018-11-06 NOTE — Assessment & Plan Note (Signed)
She has been  More consistent with use of phentermine , and has lost 16 lbs since June with Low GI diet but has not been participating in regular  exercise. She has no side effects from the medication.  Risks and benefits of medication use discussed and refilled for daily use

## 2018-11-06 NOTE — Assessment & Plan Note (Signed)
Current liver enzymes are due and all modifiable risk factors including obesity  and hyperlipidemia have been addressed .   Lab Results  Component Value Date   ALT 32 11/03/2018   AST 40 11/03/2018   ALKPHOS 81 11/03/2018   BILITOT 0.5 11/03/2018

## 2018-11-06 NOTE — Assessment & Plan Note (Signed)

## 2018-11-06 NOTE — Assessment & Plan Note (Signed)
She is taking 25 mcg daily .  Thyroid function is WNL on current dose.  No current changes needed.   Lab Results  Component Value Date   TSH 0.883 11/03/2018

## 2018-11-06 NOTE — Assessment & Plan Note (Signed)
She had a prolonged lower respiratory illness in February that was "the sickest she has ever been" and is requesting testing for past COVID 19 infection.  Discussed the implications and accuracy of the COVID 19 antibody test with patient and the lack of any definitive advice that can be given regarding the results except that continued safe distancing and masking should be done.

## 2018-11-10 ENCOUNTER — Ambulatory Visit (INDEPENDENT_AMBULATORY_CARE_PROVIDER_SITE_OTHER): Payer: No Typology Code available for payment source | Admitting: Family Medicine

## 2018-11-10 ENCOUNTER — Other Ambulatory Visit: Payer: Self-pay

## 2018-11-10 ENCOUNTER — Telehealth: Payer: Self-pay

## 2018-11-10 VITALS — BP 118/76 | HR 85 | Temp 98.5°F | Resp 16 | Ht 64.0 in | Wt 190.2 lb

## 2018-11-10 DIAGNOSIS — K6289 Other specified diseases of anus and rectum: Secondary | ICD-10-CM

## 2018-11-10 MED ORDER — MUPIROCIN 2 % EX OINT: 1.0000 "application " | TOPICAL_OINTMENT | Freq: Two times a day (BID) | CUTANEOUS | 0 refills | Status: DC

## 2018-11-10 NOTE — Telephone Encounter (Signed)
Patient thinks she has a hemorrhoid since Saturday or Sunday. Patient said that there is some bleeding.  Patient was schedule an in-person visit w/ Philis Nettle, NP for today @ 2:20 pm.  Patient has no covid symptoms; no exposure.

## 2018-11-10 NOTE — Patient Instructions (Signed)
Keep skin clean and dry  Apply ointment 2x per day to heal up skin  Can use gauze pad over area as padding

## 2018-11-10 NOTE — Telephone Encounter (Signed)
Copied from [place] [phone]. Topic: Appointment Scheduling - Scheduling Inquiry for Clinic >> Nov 10, 2018  7:08 AM Rayann Heman wrote: Reason for CRM:pt called and stated that she thinks she has a hemorrhoid and would like to schedule an appointment for today. Please advise

## 2018-11-10 NOTE — Progress Notes (Signed)
Subjective:    Patient ID: Samantha Raymond, female    DOB: 26-Oct-1960, 58 y.o.   MRN: [id number]  HPI   Patient presents to clinic due to possible hemorrhoid.  Patient states she has a painful irritated feeling on her bottom, and notices especially when wiping.  Patient states she has changed her diet recently and increase her fiber intake so she is using restroom more frequently for bowel movements due to body adjusting to new diet.  Denies any blood in stool.  Patient Active Problem List   Diagnosis Date Noted  . Reactive airways dysfunction syndrome, moderate persistent, uncomplicated ([place]) [id number]  . History of URI (upper respiratory infection) 11/06/2018  . Prediabetes 10/30/2017  . Family history of breast cancer in first degree relative 10/08/2014  . Fatty liver disease, nonalcoholic [id number]  . Visit for preventive health examination 08/20/2012  . Menopause 08/17/2012  . Obesity (BMI 30-39.9) 08/09/2011  . Hypothyroidism 08/09/2011   Social History   Tobacco Use  . Smoking status: Never Smoker  . Smokeless tobacco: Never Used  Substance Use Topics  . Alcohol use: Yes    Alcohol/week: 0.0 standard drinks   Review of Systems  Constitutional: Negative for chills, fatigue and fever.  HENT: Negative for congestion, ear pain, sinus pain and sore throat.   Eyes: Negative.   Respiratory: Negative for cough, shortness of breath and wheezing.   Cardiovascular: Negative for chest pain, palpitations and leg swelling.  Gastrointestinal: Negative for abdominal pain, diarrhea, nausea and vomiting.  Genitourinary: Negative for dysuria, frequency and urgency.  Musculoskeletal: Negative for arthralgias and myalgias.  Skin: ?hemorrhoid or irritated skin Neurological: Negative for syncope, light-headedness and headaches.  Psychiatric/Behavioral: The patient is not nervous/anxious.       Objective:   Physical Exam Vitals signs and nursing note reviewed.   Constitutional:      General: She is not in acute distress.    Appearance: She is not toxic-appearing.  HENT:     Head: Normocephalic and atraumatic.  Cardiovascular:     Rate and Rhythm: Normal rate and regular rhythm.  Pulmonary:     Effort: Pulmonary effort is normal. No respiratory distress.     Breath sounds: Normal breath sounds.  Genitourinary:      Comments: Small area of skin excoriation represented by red mark on diagram.  Appears to be from friction rub most likely from skin rubbing on skin.  No hemorrhoid noted. Skin:    General: Skin is warm and dry.     Coloration: Skin is not jaundiced or pale.  Neurological:     Mental Status: She is alert and oriented to person, place, and time.     Gait: Gait normal.  Psychiatric:        Mood and Affect: Mood normal.        Behavior: Behavior normal.    Today's Vitals   11/10/18 1427  BP: 118/76  Pulse: 85  Resp: 16  Temp: 98.5 F (36.9 C)  TempSrc: Oral  SpO2: 94%  Weight: 190 lb 3.2 oz (86.3 kg)  Height: 5\' 4"  (1.626 m)   Body mass index is 32.65 kg/m.     Assessment & Plan:    Skin irritation near rectum --patient will apply topical mupirocin ointment on area twice daily for the next 1 to 2 weeks.  Advised to keep skin clean and dry.  Advised patient area most likely occurred from skin rubbing on skin and using restroom more frequently with having to  do more frequent wiping.  Area should slowly heal, patient advised to let us know if not improving as expected.  She will otherwise keep all regular follow-ups with PCP as planned

## 2018-12-06 ENCOUNTER — Telehealth: Payer: Self-pay | Admitting: Internal Medicine

## 2018-12-06 NOTE — Telephone Encounter (Signed)
Pt is scheduled for tomorrow at 11am. Pt is aware of appt date and time.

## 2018-12-06 NOTE — Telephone Encounter (Signed)
Needs virtual visit this week for vertigo.  You can use Thursday at 12:00 if necessary /nothing else availabl.e  Can you confirm that my schedule is blocked on Friday after the 4:00 appointment tso I can be OTD at 5:00 sharp

## 2018-12-07 ENCOUNTER — Other Ambulatory Visit: Payer: Self-pay

## 2018-12-07 ENCOUNTER — Encounter: Payer: Self-pay | Admitting: Internal Medicine

## 2018-12-07 ENCOUNTER — Ambulatory Visit (INDEPENDENT_AMBULATORY_CARE_PROVIDER_SITE_OTHER): Payer: No Typology Code available for payment source | Admitting: Internal Medicine

## 2018-12-07 VITALS — BP 122/78 | Ht 64.0 in | Wt 178.0 lb

## 2018-12-07 DIAGNOSIS — H81312 Aural vertigo, left ear: Secondary | ICD-10-CM

## 2018-12-07 NOTE — Assessment & Plan Note (Signed)
Declines prednisone taper due to concern for weight gain.  Sudafed PE.  Meclizine and zofran prescribed.  Urgent ENT referral for Epley Maneuver.

## 2018-12-07 NOTE — Progress Notes (Signed)
Virtual Visit via [email or web address]  This visit type was conducted due to national recommendations for restrictions regarding the COVID-19 pandemic (e.g. social distancing).  This format is felt to be most appropriate for this patient at this time.  All issues noted in this document were discussed and addressed.  No physical exam was performed (except for noted visual exam findings with Video Visits).   I connected with@ on 12/07/18 at 11:00 AM EDT by a video enabled telemedicine application  and verified that I am speaking with the correct person using two identifiers. Location patient: home Location provider: work or home office Persons participating in the virtual visit: patient, provider  I discussed the limitations, risks, security and privacy concerns of performing an evaluation and management service by telephone and the availability of in person appointments. I also discussed with the patient that there may be a patient responsible charge related to this service. The patient expressed understanding and agreed to proceed.  Reason for visit: vertigo   HPI:  58 yr old female presents with vertigo symptoms that have been present for one week. 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    Symptoms are present with sudden changes in position (from lying in yoga class to standing up) and in the distant past it occurred at the dentist office Accompanied by a feeling of "fuzziness" in the left hear and left side of head .  acc'd by nausea.  Able to work but aggravated by desk work, looking at [employer]. bp checked by RN today 122/78.  She has no history of hypertension   No precedent sinus symptoms.   History of prediabetes.  Normotensive.  Losing weight intentionally with phentermine prescribed by me since early September.  Exercising regularly       ROS: See pertinent positives and negatives per HPI.  Past Medical History:  Diagnosis  Date  . Asthma   . hypothyroid   . Recurrent urinary tract infection     Past Surgical History:  Procedure Laterality Date  . CESAREAN SECTION      Family History  Problem Relation Age of Onset  . Asthma Mother   . Diabetes Father   . Heart disease Father        chf,  no history of AMI  . Hyperlipidemia Father   . Arthritis Sister   . Cancer Sister 23       breast cancer , second diagnosis at 59  . Asthma Brother   . Stroke Maternal Aunt   . Cancer Maternal Aunt        breast ca    SOCIAL HX:  reports that she has never smoked. She has never used smokeless tobacco. She reports current alcohol use. She reports that she does not use drugs.   Current Outpatient Medications:  .  albuterol (PROVENTIL HFA;VENTOLIN HFA) 108 (90 Base) MCG/ACT inhaler, Inhale 2 puffs into the lungs every 6 (six) hours as needed for wheezing or shortness of breath., Disp: 1 Inhaler, Rfl: 3 .  albuterol (PROVENTIL) (2.5 MG/3ML) 0.083% nebulizer solution, Take 3 mLs (2.5 mg total) by nebulization every 6 (six) hours as needed for wheezing or shortness of breath., Disp: 45 mL, Rfl: 1 .  cyanocobalamin (,VITAMIN B-12,) 1000 MCG/ML injection, Inject 1 mL (1,000 mcg total) into the muscle every 30 (thirty) days., Disp: 1 mL, Rfl: 5 .  escitalopram (LEXAPRO) 10 MG tablet, TAKE 1 TABLET BY MOUTH ONCE  DAILY, Disp: 90 tablet, Rfl: 1 .  fluticasone (FLONASE) 50 MCG/ACT nasal spray, Place 1 spray into both nostrils daily., Disp: , Rfl:  .  Fluticasone-Salmeterol (ADVAIR DISKUS) 500-50 MCG/DOSE AEPB, Inhale 1 puff 2 (two) times daily into the lungs. RINSE MOUTH AFTER EACH USE TO PREVENT THRUSH, Disp: 60 each, Rfl: 5 .  levothyroxine (SYNTHROID, LEVOTHROID) 25 MCG tablet, TAKE 1 TABLET BY MOUTH DAILY BEFORE BREAKFAST., Disp: 90 tablet, Rfl: 0 .  Multiple Vitamin (MULTIVITAMIN) tablet, Take 1 tablet by mouth daily., Disp: , Rfl:  .  ondansetron (ZOFRAN ODT) 4 MG disintegrating tablet, Take 1 tablet (4 mg total) by  mouth every 8 (eight) hours as needed for nausea., Disp: 20 tablet, Rfl: 0 .  phentermine (ADIPEX-P) 37.5 MG tablet, TAKE 1/2 TABLET BY MOUTH IN THE MORNING AND EARLY AFTERNOON, Disp: 30 tablet, Rfl: 2 .  Syringe/Needle, Disp, (SYRINGE 3CC/25GX1") 25G X 1" 3 ML MISC, Use for b12 injections, Disp: 6 each, Rfl: 0  EXAM:  VITALS per patient if applicable:  GENERAL: alert, oriented, appears well and in no acute distress  HEENT: atraumatic, conjunttiva clear, no obvious abnormalities on inspection of external nose and ears  NECK: normal movements of the head and neck  LUNGS: on inspection no signs of respiratory distress, breathing rate appears normal, no obvious gross SOB, gasping or wheezing  CV: no obvious cyanosis  MS: moves all visible extremities without noticeable abnormality  PSYCH/NEURO: pleasant and cooperative, no obvious depression or anxiety, speech and thought processing grossly intact. NO NYSTAGMUS WITH LATERAL OR VERTICAL GAZE  ASSESSMENT AND PLAN:  Discussed the following assessment and plan:  Vertigo, aural, left - Plan: Ambulatory referral to ENT  Vertigo, aural, left Declines prednisone taper due to concern for weight gain.  Sudafed PE.  Meclizine and zofran prescribed.  Urgent ENT referral for Epley Maneuver.     I discussed the assessment and treatment plan with the patient. The patient was provided an opportunity to ask questions and all were answered. The patient agreed with the plan and demonstrated an understanding of the instructions.   The patient was advised to call back or seek an in-person evaluation if the symptoms worsen or if the condition fails to improve as anticipated.  I provided  25 minutes of non-face-to-face time during this encounter reviewing patient's current problems  Providing counseling on the above mentioned problems , and coordination  of care . Crecencio Mc, MD

## 2018-12-14 ENCOUNTER — Encounter: Payer: No Typology Code available for payment source | Admitting: Internal Medicine

## 2018-12-19 MED FILL — ESCITALOPRAM 10 MG TABLET: 10 | 90 days supply | Qty: 90 | Fill #1

## 2018-12-19 MED FILL — PHENTERMINE 37.5 MG TABLET: 37.5 | 30 days supply | Qty: 30 | Fill #1

## 2019-02-08 ENCOUNTER — Other Ambulatory Visit: Payer: Self-pay | Admitting: Internal Medicine

## 2019-02-08 MED FILL — PHENTERMINE 37.5 MG TABLET: 37.5 | 30 days supply | Qty: 30 | Fill #2

## 2019-02-08 MED FILL — LEVOTHYROXINE 25 MCG TABLET: 25 | 90 days supply | Qty: 90 | Fill #0

## 2019-03-09 ENCOUNTER — Other Ambulatory Visit: Payer: Self-pay | Admitting: Internal Medicine

## 2019-03-09 NOTE — Telephone Encounter (Signed)
MyChart message sent .  Need weight report priro to refill

## 2019-03-10 MED ORDER — PHENTERMINE HCL 37.5 MG PO TABS: ORAL_TABLET | ORAL | 2 refills | Status: DC

## 2019-03-10 MED FILL — PHENTERMINE 37.5 MG TABLET: 37.5 | 30 days supply | Qty: 30 | Fill #0

## 2019-03-31 ENCOUNTER — Other Ambulatory Visit: Payer: Self-pay | Admitting: Internal Medicine

## 2019-03-31 MED FILL — ESCITALOPRAM 10 MG TABLET: 10 | 90 days supply | Qty: 90 | Fill #0

## 2019-04-17 MED FILL — PHENTERMINE 37.5 MG TABLET: 37.5 | 30 days supply | Qty: 30 | Fill #1

## 2019-05-12 ENCOUNTER — Ambulatory Visit: Payer: No Typology Code available for payment source | Admitting: Dermatology

## 2019-06-12 MED FILL — PHENTERMINE 37.5 MG TABLET: 37.5 | 30 days supply | Qty: 30 | Fill #2

## 2019-07-20 MED FILL — ESCITALOPRAM 10 MG TABLET: 10 | 90 days supply | Qty: 90 | Fill #1

## 2019-08-29 ENCOUNTER — Telehealth: Payer: No Typology Code available for payment source | Admitting: Nurse Practitioner

## 2019-08-29 DIAGNOSIS — B029 Zoster without complications: Secondary | ICD-10-CM | POA: Diagnosis not present

## 2019-08-29 MED ORDER — VALACYCLOVIR HCL 1 G PO TABS: 1000.0000 mg | ORAL_TABLET | Freq: Three times a day (TID) | ORAL | 0 refills | Status: DC

## 2019-08-29 MED FILL — valACYclovir HCL 1 GM TABS: 1 | 7 days supply | Qty: 21 | Fill #0

## 2019-08-29 NOTE — Progress Notes (Signed)
E-visit for Shingles   We are sorry that you are not feeling well. Here is how we plan to help!  Based on what you shared with me it looks like you have shingles.  Shingles or herpes zoster, is a common infection of the nerves.  It is a painful rash caused by the herpes zoster virus.  This is the same virus that causes chickenpox.  After a person has chickenpox, the virus remains inactive in the nerve cells.  Years later, the virus can become active again and travel to the skin.  It typically will appear on one side of the face or body.  Burning or shooting pain, tingling, or itching are early signs of the infection.  Blisters typically scab over in 7 to 10 days and clear up within 2-4 weeks. Shingles is only contagious to people that have never had the chickenpox, the chickenpox vaccine, or anyone who has a compromised immune system.  You should avoid contact with these type of people until your blisters scab over.  I have prescribed Valacyclovir 1g three times daily for 7 days and also Gabapentin 300mg twice daily as needed for pain   HOME CARE: Apply ice packs (wrapped in a thin towel), cool compresses, or soak in cool bath to help reduce pain. Use calamine lotion to calm itchy skin. Avoid scratching the rash. Avoid direct sunlight.  GET HELP RIGHT AWAY IF: Symptoms that don't away after treatment. A rash or blisters near your eye. Increased drainage, fever, or rash after treatment. Severe pain that doesn't go away.   MAKE SURE YOU   Understand these instructions. Will watch your condition. Will get help right away if you are not doing well or get worse.  Thank you for choosing an e-visit.  Your e-visit answers were reviewed by a board certified advanced clinical practitioner to complete your personal care plan. Depending upon the condition, your plan could have included both over the counter or prescription medications.  Please review your pharmacy choice. Make sure the pharmacy  is open so you can pick up prescription now. If there is a problem, you may contact your provider through MyChart messaging and have the prescription routed to another pharmacy.  Your safety is important to us. If you have drug allergies check your prescription carefully.   For the next 24 hours you can use MyChart to ask questions about today's visit, request a non-urgent call back, or ask for a work or school excuse. You will get an email in the next two days asking about your experience. I hope that your e-visit has been valuable and will speed your recovery.  5-10 minutes spent reviewing and documenting in chart.  

## 2019-11-08 ENCOUNTER — Other Ambulatory Visit: Payer: Self-pay | Admitting: Internal Medicine

## 2019-11-08 ENCOUNTER — Encounter: Payer: Self-pay | Admitting: Internal Medicine

## 2019-11-08 ENCOUNTER — Other Ambulatory Visit: Payer: Self-pay

## 2019-11-08 ENCOUNTER — Ambulatory Visit (INDEPENDENT_AMBULATORY_CARE_PROVIDER_SITE_OTHER): Payer: No Typology Code available for payment source | Admitting: Internal Medicine

## 2019-11-08 VITALS — BP 126/70 | HR 70 | Temp 98.2°F | Ht 64.02 in | Wt 210.0 lb

## 2019-11-08 DIAGNOSIS — F4329 Adjustment disorder with other symptoms: Secondary | ICD-10-CM

## 2019-11-08 DIAGNOSIS — Z0000 Encounter for general adult medical examination without abnormal findings: Secondary | ICD-10-CM | POA: Diagnosis not present

## 2019-11-08 DIAGNOSIS — Z124 Encounter for screening for malignant neoplasm of cervix: Secondary | ICD-10-CM | POA: Diagnosis not present

## 2019-11-08 DIAGNOSIS — E034 Atrophy of thyroid (acquired): Secondary | ICD-10-CM | POA: Diagnosis not present

## 2019-11-08 DIAGNOSIS — Z23 Encounter for immunization: Secondary | ICD-10-CM | POA: Diagnosis not present

## 2019-11-08 DIAGNOSIS — R7303 Prediabetes: Secondary | ICD-10-CM | POA: Diagnosis not present

## 2019-11-08 DIAGNOSIS — Z1211 Encounter for screening for malignant neoplasm of colon: Secondary | ICD-10-CM

## 2019-11-08 DIAGNOSIS — K760 Fatty (change of) liver, not elsewhere classified: Secondary | ICD-10-CM

## 2019-11-08 DIAGNOSIS — Z1231 Encounter for screening mammogram for malignant neoplasm of breast: Secondary | ICD-10-CM | POA: Diagnosis not present

## 2019-11-08 MED ORDER — ZOSTER VAC RECOMB ADJUVANTED 50 MCG/0.5ML IM SUSR: 0.5000 mL | Freq: Once | INTRAMUSCULAR | 1 refills | Status: DC

## 2019-11-08 NOTE — Patient Instructions (Signed)
Referral to Dr Tarri Glenn in [facility] for colonoscopy is In progress  Mammogram has been ordered  Health Maintenance for Postmenopausal Women Menopause is a normal process in which your ability to get pregnant comes to an end. This process happens slowly over many months or years, usually between the ages of 15 and 52. Menopause is complete when you have missed your menstrual periods for 12 months. It is important to talk with your health care provider about some of the most common conditions that affect women after menopause (postmenopausal women). These include heart disease, cancer, and bone loss (osteoporosis). Adopting a healthy lifestyle and getting preventive care can help to promote your health and wellness. The actions you take can also lower your chances of developing some of these common conditions. What should I know about menopause? During menopause, you may get a number of symptoms, such as:  Hot flashes. These can be moderate or severe.  Night sweats.  Decrease in sex drive.  Mood swings.  Headaches.  Tiredness.  Irritability.  Memory problems.  Insomnia. Choosing to treat or not to treat these symptoms is a decision that you make with your health care provider. Do I need hormone replacement therapy?  Hormone replacement therapy is effective in treating symptoms that are caused by menopause, such as hot flashes and night sweats.  Hormone replacement carries certain risks, especially as you become older. If you are thinking about using estrogen or estrogen with progestin, discuss the benefits and risks with your health care provider. What is my risk for heart disease and stroke? The risk of heart disease, heart attack, and stroke increases as you age. One of the causes may be a change in the body's hormones during menopause. This can affect how your body uses dietary fats, triglycerides, and cholesterol. Heart attack and stroke are medical emergencies. There are many things  that you can do to help prevent heart disease and stroke. Watch your blood pressure  High blood pressure causes heart disease and increases the risk of stroke. This is more likely to develop in people who have high blood pressure readings, are of African descent, or are overweight.  Have your blood pressure checked: ? Every 3-5 years if you are 50-40 years of age. ? Every year if you are 67 years old or older. Eat a healthy diet   Eat a diet that includes plenty of vegetables, fruits, low-fat dairy products, and lean protein.  Do not eat a lot of foods that are high in solid fats, added sugars, or sodium. Get regular exercise Get regular exercise. This is one of the most important things you can do for your health. Most adults should:  Try to exercise for at least 150 minutes each week. The exercise should increase your heart rate and make you sweat (moderate-intensity exercise).  Try to do strengthening exercises at least twice each week. Do these in addition to the moderate-intensity exercise.  Spend less time sitting. Even light physical activity can be beneficial. Other tips  Work with your health care provider to achieve or maintain a healthy weight.  Do not use any products that contain nicotine or tobacco, such as cigarettes, e-cigarettes, and chewing tobacco. If you need help quitting, ask your health care provider.  Know your numbers. Ask your health care provider to check your cholesterol and your blood sugar (glucose). Continue to have your blood tested as directed by your health care provider. Do I need screening for cancer? Depending on your health history  and family history, you may need to have cancer screening at different stages of your life. This may include screening for:  Breast cancer.  Cervical cancer.  Lung cancer.  Colorectal cancer. What is my risk for osteoporosis? After menopause, you may be at increased risk for osteoporosis. Osteoporosis is a  condition in which bone destruction happens more quickly than new bone creation. To help prevent osteoporosis or the bone fractures that can happen because of osteoporosis, you may take the following actions:  If you are 31-80 years old, get at least 1,000 mg of calcium and at least 600 mg of vitamin D per day.  If you are older than age 85 but younger than age 102, get at least 1,200 mg of calcium and at least 600 mg of vitamin D per day.  If you are older than age 25, get at least 1,200 mg of calcium and at least 800 mg of vitamin D per day. Smoking and drinking excessive alcohol increase the risk of osteoporosis. Eat foods that are rich in calcium and vitamin D, and do weight-bearing exercises several times each week as directed by your health care provider. How does menopause affect my mental health? Depression may occur at any age, but it is more common as you become older. Common symptoms of depression include:  Low or sad mood.  Changes in sleep patterns.  Changes in appetite or eating patterns.  Feeling an overall lack of motivation or enjoyment of activities that you previously enjoyed.  Frequent crying spells. Talk with your health care provider if you think that you are experiencing depression. General instructions See your health care provider for regular wellness exams and vaccines. This may include:  Scheduling regular health, dental, and eye exams.  Getting and maintaining your vaccines. These include: ? Influenza vaccine. Get this vaccine each year before the flu season begins. ? Pneumonia vaccine. ? Shingles vaccine. ? Tetanus, diphtheria, and pertussis (Tdap) booster vaccine. Your health care provider may also recommend other immunizations. Tell your health care provider if you have ever been abused or do not feel safe at home. Summary  Menopause is a normal process in which your ability to get pregnant comes to an end.  This condition causes hot flashes, night  sweats, decreased interest in sex, mood swings, headaches, or lack of sleep.  Treatment for this condition may include hormone replacement therapy.  Take actions to keep yourself healthy, including exercising regularly, eating a healthy diet, watching your weight, and checking your blood pressure and blood sugar levels.  Get screened for cancer and depression. Make sure that you are up to date with all your vaccines. This information is not intended to replace advice given to you by your health care provider. Make sure you discuss any questions you have with your health care provider. Document Revised: 01/26/2018 Document Reviewed: 01/26/2018 Elsevier Patient Education  2020 [employer].

## 2019-11-08 NOTE — Progress Notes (Signed)
Patient ID: Shauniece Raymond, female    DOB: 17-Jun-1960  Age: 59 y.o. MRN: [id number]  The patient is here for annual preventive examination and management of other chronic and acute problems.  This visit occurred during the SARS-CoV-2 public health emergency.  Safety protocols were in place, including screening questions prior to the visit, additional usage of staff PPE, and extensive cleaning of exam room while observing appropriate contact time as indicated for disinfecting solutions.     The risk factors are reflected in the social history.  The roster of all physicians providing medical care to patient - is listed in the Snapshot section of the chart.  Activities of daily living:  The patient is 100% independent in all ADLs: dressing, toileting, feeding as well as independent mobility  Home safety : The patient has smoke detectors in the home. They wear seatbelts.  There are no firearms at home. There is no violence in the home.   There is no risks for hepatitis, STDs or HIV. There is no   history of blood transfusion. They have no travel history to infectious disease endemic areas of the world.  The patient has seen their dentist in the last six month. They have seen their eye doctor in the last year. They admit to slight hearing difficulty with regard to whispered voices and some television programs.  They have deferred audiologic testing in the last year.  They do not  have excessive sun exposure. Discussed the need for sun protection: hats, long sleeves and use of sunscreen if there is significant sun exposure.   Diet: the importance of a healthy diet is discussed. They do have a healthy diet.  The benefits of regular aerobic exercise were discussed. She walks 4 times per week ,  20 minutes.   Depression screen: there are no signs or vegative symptoms of depression- irritability, change in appetite, anhedonia, sadness/tearfullness.  Cognitive assessment: the patient manages all  their financial and personal affairs and is actively engaged. They could relate day,date,year and events; recalled 2/3 objects at 3 minutes; performed clock-face test normally.  The following portions of the patient's history were reviewed and updated as appropriate: allergies, current medications, past family history, past medical history,  past surgical history, past social history  and problem list.  Visual acuity was not assessed per patient preference since she has regular follow up with her ophthalmologist. Hearing and body mass index were assessed and reviewed.   During the course of the visit the patient was educated and counseled about appropriate screening and preventive services including : fall prevention , diabetes screening, nutrition counseling, colorectal cancer screening, and recommended immunizations.    CC: The primary encounter diagnosis was Need for immunization against influenza. Diagnoses of Encounter for screening mammogram for malignant neoplasm of breast, Colon cancer screening, Pap smear for cervical cancer screening, Prediabetes, Hypothyroidism due to acquired atrophy of thyroid, Visit for preventive health examination, Fatty liver disease, nonalcoholic, and Stress and adjustment reaction were also pertinent to this visit.  STRESS:  Grandson , 6 diagnosed with neuroblastoma in mid June the night before her house closing ,  Responding to chemotherapy.  Surgery is planned , possible nephrectomy. House closing .  having to face stress at work.  Overeating   Samantha Raymond   Had shingles in July  History Samantha Raymond has a past medical history of Asthma, hypothyroid, and Recurrent urinary tract infection.   She has a past surgical history that includes Cesarean section.   Her  family history includes Arthritis in her sister; Asthma in her brother and mother; Cancer in her maternal aunt; Cancer (age of onset: 50) in her sister; Diabetes in her father; Heart disease in her father;  Hyperlipidemia in her father; Stroke in her maternal aunt.She reports that she has never smoked. She has never used smokeless tobacco. She reports current alcohol use. She reports that she does not use drugs.  Outpatient Medications Prior to Visit  Medication Sig Dispense Refill   albuterol (PROVENTIL HFA;VENTOLIN HFA) 108 (90 Base) MCG/ACT inhaler Inhale 2 puffs into the lungs every 6 (six) hours as needed for wheezing or shortness of breath. 1 Inhaler 3   albuterol (PROVENTIL) (2.5 MG/3ML) 0.083% nebulizer solution Take 3 mLs (2.5 mg total) by nebulization every 6 (six) hours as needed for wheezing or shortness of breath. 45 mL 1   escitalopram (LEXAPRO) 10 MG tablet TAKE 1 TABLET BY MOUTH ONCE DAILY 90 tablet 1   fluticasone (FLONASE) 50 MCG/ACT nasal spray Place 1 spray into both nostrils daily.     Fluticasone-Salmeterol (ADVAIR DISKUS) 500-50 MCG/DOSE AEPB Inhale 1 puff 2 (two) times daily into the lungs. RINSE MOUTH AFTER EACH USE TO PREVENT THRUSH 60 each 5   levothyroxine (SYNTHROID) 25 MCG tablet TAKE 1 TABLET BY MOUTH DAILY BEFORE BREAKFAST. 90 tablet 0   Multiple Vitamin (MULTIVITAMIN) tablet Take 1 tablet by mouth daily.     ondansetron (ZOFRAN ODT) 4 MG disintegrating tablet Take 1 tablet (4 mg total) by mouth every 8 (eight) hours as needed for nausea. 20 tablet 0   cyanocobalamin (,VITAMIN B-12,) 1000 MCG/ML injection Inject 1 mL (1,000 mcg total) into the muscle every 30 (thirty) days. 1 mL 5   phentermine (ADIPEX-P) 37.5 MG tablet TAKE 1/2 TABLET BY MOUTH IN THE MORNING AND EARLY AFTERNOON 30 tablet 2   Syringe/Needle, Disp, (SYRINGE 3CC/25GX1") 25G X 1" 3 ML MISC Use for b12 injections 6 each 0   valACYclovir (VALTREX) 1000 MG tablet Take 1 tablet (1,000 mg total) by mouth 3 (three) times daily. 21 tablet 0   No facility-administered medications prior to visit.    Review of Systems   Patient denies headache, fevers, malaise, unintentional weight loss, skin rash,  eye pain, sinus congestion and sinus pain, sore throat, dysphagia,  hemoptysis , cough, dyspnea, wheezing, chest pain, palpitations, orthopnea, edema, abdominal pain, nausea, melena, diarrhea, constipation, flank pain, dysuria, hematuria, urinary  Frequency, nocturia, numbness, tingling, seizures,  Focal weakness, Loss of consciousness,  Tremor, insomnia, depression, anxiety, and suicidal ideation.     Objective:  BP 126/70 (BP Location: Left Arm, Patient Position: Sitting)    Pulse 70    Temp 98.2 F (36.8 C)    Ht 5' 4.02" (1.626 m)    Wt 210 lb (95.3 kg)    SpO2 97%    BMI 36.03 kg/m   Physical Exam  General Appearance:    Alert, cooperative, no distress, appears stated age  Head:    Normocephalic, without obvious abnormality, atraumatic  Eyes:    PERRL, conjunctiva/corneas clear, EOM's intact, fundi    benign, both eyes  Ears:    Normal TM's and external ear canals, both ears  Nose:   Nares normal, septum midline, mucosa normal, no drainage    or sinus tenderness  Throat:   Lips, mucosa, and tongue normal; teeth and gums normal  Neck:   Supple, symmetrical, trachea midline, no adenopathy;    thyroid:  no enlargement/tenderness/nodules; no carotid   bruit or JVD  Back:     Symmetric, no curvature, ROM normal, no CVA tenderness  Lungs:     Clear to auscultation bilaterally, respirations unlabored  Chest Wall:    No tenderness or deformity   Heart:    Regular rate and rhythm, S1 and S2 normal, no murmur, rub   or gallop  Breast Exam:    No tenderness, masses, or nipple abnormality  Abdomen:     Soft, non-tender, bowel sounds active all four quadrants,    no masses, no organomegaly  Genitalia:    Pelvic: cervix normal in appearance, external genitalia normal, no adnexal masses or tenderness, no cervical motion tenderness, rectovaginal septum normal, uterus normal size, retroverted , and consistency and vagina normal without discharge  Extremities:   Extremities normal, atraumatic, no  cyanosis or edema  Pulses:   2+ and symmetric all extremities  Skin:   Skin color, texture, turgor normal, no rashes or lesions  Lymph nodes:   Cervical, supraclavicular, and axillary nodes normal  Neurologic:   CNII-XII intact, normal strength, sensation and reflexes    throughout    Assessment & Plan:   Problem List Items Addressed This Visit      Unprioritized   Hypothyroidism    She is taking 25 mcg daily .  Thyroid function is WNL on current dose.  No current changes needed.   Lab Results  Component Value Date   TSH 0.760 11/08/2019         Relevant Orders   TSH (Completed)   Visit for preventive health examination    age appropriate education and counseling updated, referrals for preventative services and immunizations addressed, dietary and smoking counseling addressed, most recent labs reviewed.  I have personally reviewed and have noted:  1) the patient's medical and social history 2) The pt's use of alcohol, tobacco, and illicit drugs 3) The patient's current medications and supplements 4) Functional ability including ADL's, fall risk, home safety risk, hearing and visual impairment 5) Diet and physical activities 6) Evidence for depression or mood disorder 7) The patient's height, weight, and BMI have been recorded in the chart  I have made referrals, and provided counseling and education based on review of the above      Fatty liver disease, nonalcoholic      Current liver enzymes are due and all modifiable risk factors including obesity  and hyperlipidemia have been addressed .   Lab Results  Component Value Date   ALT 28 11/08/2019   AST 26 11/08/2019   ALKPHOS 86 11/08/2019   BILITOT 0.4 11/08/2019            Prediabetes    Her fasting glucose is not diagnostic of diabetes .  I recommend she follow a low glycemic index diet and particpate regularly in an aerobic  exercise activity.  We should check an A1c in 6 months.   Lab Results   Component Value Date   HGBA1C 5.9 (H) 11/08/2019         Relevant Orders   Hemoglobin A1c (Completed)   Lipid panel (Completed)   Comprehensive metabolic panel (Completed)   Stress and adjustment reaction    Secondary to job, home and family stressors.  counselling given .  Patient defers medication at this time        Other Visit Diagnoses    Need for immunization against influenza    -  Primary   Relevant Orders   Flu Vaccine QUAD 36+ mos IM (Completed)   Encounter  for screening mammogram for malignant neoplasm of breast       Relevant Orders   MM 3D SCREEN BREAST BILATERAL   Colon cancer screening       Relevant Orders   Ambulatory referral to Gastroenterology   Pap smear for cervical cancer screening       Relevant Orders   Cytology - PAP( [place])      I have discontinued [employer]stein Stys's SYRINGE 3CC/25GX1", cyanocobalamin, phentermine, and valACYclovir. I am also having her start on Zoster Vaccine Adjuvanted. Additionally, I am having her maintain her multivitamin, Fluticasone-Salmeterol, ondansetron, fluticasone, albuterol, albuterol, levothyroxine, and escitalopram.  Meds ordered this encounter  Medications   Zoster Vaccine Adjuvanted [facility]) injection    Sig: Inject 0.5 mLs into the muscle once for 1 dose.    Dispense:  1 each    Refill:  1    Medications Discontinued During This Encounter  Medication Reason   Syringe/Needle, Disp, (SYRINGE 3CC/25GX1") 25G X 1" 3 ML MISC Completed Course   cyanocobalamin (,VITAMIN B-12,) 1000 MCG/ML injection Completed Course   phentermine (ADIPEX-P) 37.5 MG tablet Completed Course   valACYclovir (VALTREX) 1000 MG tablet Completed Course    Follow-up: No follow-ups on file.   Crecencio Mc, MD

## 2019-11-09 DIAGNOSIS — F4329 Adjustment disorder with other symptoms: Secondary | ICD-10-CM | POA: Insufficient documentation

## 2019-11-09 LAB — COMPREHENSIVE METABOLIC PANEL
ALT: 28 IU/L (ref 0–32)
AST: 26 IU/L (ref 0–40)
Albumin/Globulin Ratio: 2 (ref 1.2–2.2)
Albumin: 4.4 g/dL (ref 3.8–4.9)
Alkaline Phosphatase: 86 IU/L (ref 44–121)
BUN/Creatinine Ratio: 17 (ref 12–28)
BUN: 13 mg/dL (ref 8–27)
Bilirubin Total: 0.4 mg/dL (ref 0.0–1.2)
CO2: 24 mmol/L (ref 20–29)
Calcium: 9.3 mg/dL (ref 8.7–10.3)
Chloride: 105 mmol/L (ref 96–106)
Creatinine, Ser: 0.77 mg/dL (ref 0.57–1.00)
GFR calc Af Amer: 97 mL/min/{1.73_m2} (ref >59)
GFR calc non Af Amer: 84 mL/min/{1.73_m2} (ref >59)
Globulin, Total: 2.2 g/dL (ref 1.5–4.5)
Glucose: 104 mg/dL — ABNORMAL HIGH (ref 65–99)
Potassium: 4.9 mmol/L (ref 3.5–5.2)
Sodium: 141 mmol/L (ref 134–144)
Total Protein: 6.6 g/dL (ref 6.0–8.5)

## 2019-11-09 LAB — TSH: TSH: 0.76 u[IU]/mL (ref 0.450–4.500)

## 2019-11-09 LAB — LIPID PANEL
Chol/HDL Ratio: 3.2 ratio (ref 0.0–4.4)
Cholesterol, Total: 190 mg/dL (ref 100–199)
HDL: 60 mg/dL (ref >39)
LDL Chol Calc (NIH): 105 mg/dL — ABNORMAL HIGH (ref 0–99)
Triglycerides: 141 mg/dL (ref 0–149)
VLDL Cholesterol Cal: 25 mg/dL (ref 5–40)

## 2019-11-09 LAB — HEMOGLOBIN A1C
Est. average glucose Bld gHb Est-mCnc: 123 mg/dL
Hgb A1c MFr Bld: 5.9 % — ABNORMAL HIGH (ref 4.8–5.6)

## 2019-11-09 NOTE — Assessment & Plan Note (Signed)
Her fasting glucose is not diagnostic of diabetes .  I recommend she follow a low glycemic index diet and particpate regularly in an aerobic  exercise activity.  We should check an A1c in 6 months.   Lab Results  Component Value Date   HGBA1C 5.9 (H) 11/08/2019

## 2019-11-09 NOTE — Assessment & Plan Note (Signed)
Current liver enzymes are due and all modifiable risk factors including obesity  and hyperlipidemia have been addressed .   Lab Results  Component Value Date   ALT 28 11/08/2019   AST 26 11/08/2019   ALKPHOS 86 11/08/2019   BILITOT 0.4 11/08/2019

## 2019-11-09 NOTE — Assessment & Plan Note (Signed)
Secondary to job, home and family stressors.  counselling given .  Patient defers medication at this time

## 2019-11-09 NOTE — Assessment & Plan Note (Signed)

## 2019-11-09 NOTE — Assessment & Plan Note (Signed)
She is taking 25 mcg daily .  Thyroid function is WNL on current dose.  No current changes needed.   Lab Results  Component Value Date   TSH 0.760 11/08/2019

## 2019-11-13 ENCOUNTER — Other Ambulatory Visit: Payer: Self-pay | Admitting: Internal Medicine

## 2019-11-13 MED FILL — ESCITALOPRAM 10 MG TABLET: 10 | 90 days supply | Qty: 90 | Fill #0

## 2019-11-14 ENCOUNTER — Other Ambulatory Visit: Payer: Self-pay | Admitting: Internal Medicine

## 2019-11-14 MED FILL — ADVAIR 500/50 DISKUS: 500-50 | 30 days supply | Qty: 60 | Fill #0

## 2019-11-16 LAB — IGP, APTIMA HPV: HPV Aptima: NEGATIVE

## 2019-11-21 NOTE — Progress Notes (Signed)
I am pleased to inform you that your PAP smear was normal,  And your  HPV screen was negative.  We will repeat in 3 years    Dr Zoe Goonan 

## 2019-11-24 ENCOUNTER — Ambulatory Visit: Payer: No Typology Code available for payment source

## 2019-11-28 ENCOUNTER — Ambulatory Visit
Admission: RE | Admit: 2019-11-28 | Discharge: 2019-11-28 | Disposition: A | Discharge status: Home / Self Care | Payer: No Typology Code available for payment source | Source: Ambulatory Visit | Attending: Internal Medicine | Admitting: Internal Medicine

## 2019-11-28 ENCOUNTER — Other Ambulatory Visit: Payer: Self-pay

## 2019-11-28 DIAGNOSIS — Z1231 Encounter for screening mammogram for malignant neoplasm of breast: Secondary | ICD-10-CM

## 2019-12-14 MED FILL — SHINGRIX 50 MCG SUS: 50 | 1 days supply | Qty: 1 | Fill #0

## 2019-12-27 ENCOUNTER — Encounter: Payer: Self-pay | Admitting: Gastroenterology

## 2020-01-17 ENCOUNTER — Ambulatory Visit: Payer: No Typology Code available for payment source | Attending: Internal Medicine

## 2020-01-17 ENCOUNTER — Other Ambulatory Visit (HOSPITAL_COMMUNITY): Payer: Self-pay | Admitting: Internal Medicine

## 2020-01-17 DIAGNOSIS — Z23 Encounter for immunization: Secondary | ICD-10-CM

## 2020-01-17 NOTE — Progress Notes (Signed)
   Covid-19 Vaccination Clinic  Name:  Samantha Raymond    MRN: [id number] DOB: 30-Jan-1961  01/17/2020  Samantha Raymond was observed post Covid-19 immunization for 15 minutes without incident. She was provided with Vaccine Information Sheet and instruction to access the V-Safe system.   Samantha Raymond was instructed to call 911 with any severe reactions post vaccine: Marland Kitchen Difficulty breathing  . Swelling of face and throat  . A fast heartbeat  . A bad rash all over body  . Dizziness and weakness   Immunizations Administered    Name Date Dose VIS Date Route   Pfizer COVID-19 Vaccine 01/17/2020  9:36 AM 0.3 mL 12/06/2019 Intramuscular   Manufacturer: [place]   Lot: [id number]   [place]: [id number]

## 2020-02-07 ENCOUNTER — Ambulatory Visit: Payer: No Typology Code available for payment source

## 2020-02-19 ENCOUNTER — Other Ambulatory Visit: Payer: Self-pay

## 2020-02-19 ENCOUNTER — Other Ambulatory Visit: Payer: Self-pay | Admitting: Gastroenterology

## 2020-02-19 ENCOUNTER — Ambulatory Visit (AMBULATORY_SURGERY_CENTER): Payer: Self-pay | Admitting: *Deleted

## 2020-02-19 VITALS — Ht 64.0 in | Wt 217.0 lb

## 2020-02-19 DIAGNOSIS — Z1211 Encounter for screening for malignant neoplasm of colon: Secondary | ICD-10-CM

## 2020-02-19 MED ORDER — SUPREP BOWEL PREP KIT 17.5-3.13-1.6 GM/177ML PO SOLN: 1.0000 | Freq: Once | ORAL | 0 refills | Status: DC

## 2020-02-19 MED FILL — SUPREP BOWEL PREP KIT: 17.5-3.13-1 | 1 days supply | Qty: 354 | Fill #0

## 2020-02-19 NOTE — Progress Notes (Signed)
No egg or soy allergy known to patient  No issues with past sedation with any surgeries or procedures No intubation problems in the past  No FH of Malignant Hyperthermia No diet pills per patient No home 02 use per patient  No blood thinners per patient  Pt denies issues with constipation  No A fib or A flutter  EMMI video to pt or via MyChart  COVID 19 guidelines implemented in [facility] today with Pt and RN  Pt is fully vaccinated  for [employer] given to pt in [facility] today , Code to Pharmacy   Due to the COVID-19 pandemic we are asking patients to follow certain guidelines.  Pt aware of COVID protocols and [facility] guidelines

## 2020-02-26 ENCOUNTER — Telehealth: Payer: Self-pay | Admitting: Gastroenterology

## 2020-02-26 NOTE — Telephone Encounter (Signed)
Hi Dr. Tarri Glenn, this patient just called to r/s her procedure that was scheduled on 03/04/20 because she was exposed to Covid. Patient has rescheduled to 04/08/20 at 3:30pm. Thank you.

## 2020-02-26 NOTE — Telephone Encounter (Signed)
Noted. Thanks.

## 2020-03-02 MED FILL — ESCITALOPRAM 10 MG TABLET: 10 | 90 days supply | Qty: 90 | Fill #1

## 2020-03-02 MED FILL — SUPREP BOWEL PREP KIT: 17.5-3.13-1 | 1 days supply | Qty: 354 | Fill #0

## 2020-03-04 ENCOUNTER — Encounter: Payer: No Typology Code available for payment source | Admitting: Gastroenterology

## 2020-03-29 MED FILL — SHINGRIX 50 MCG SUS: 50 | 1 days supply | Qty: 1 | Fill #1

## 2020-04-01 ENCOUNTER — Other Ambulatory Visit: Payer: Self-pay | Admitting: Internal Medicine

## 2020-04-01 MED FILL — LEVOTHYROXINE 25 MCG TABLET: 25 | 90 days supply | Qty: 90 | Fill #0

## 2020-04-08 ENCOUNTER — Encounter: Payer: No Typology Code available for payment source | Admitting: Gastroenterology

## 2020-04-09 MED FILL — SHINGRIX 50 MCG SUS: 50 | 1 days supply | Qty: 1 | Fill #1

## 2020-04-10 MED FILL — LEVOTHYROXINE 25 MCG TABLET: 25 | 90 days supply | Qty: 90 | Fill #0

## 2020-05-01 DIAGNOSIS — R635 Abnormal weight gain: Secondary | ICD-10-CM

## 2020-05-08 ENCOUNTER — Telehealth: Payer: Self-pay

## 2020-05-08 ENCOUNTER — Telehealth: Payer: Self-pay | Admitting: Internal Medicine

## 2020-05-08 DIAGNOSIS — E034 Atrophy of thyroid (acquired): Secondary | ICD-10-CM

## 2020-05-08 DIAGNOSIS — R7303 Prediabetes: Secondary | ICD-10-CM

## 2020-05-08 DIAGNOSIS — E785 Hyperlipidemia, unspecified: Secondary | ICD-10-CM

## 2020-05-08 NOTE — Telephone Encounter (Signed)
Patient called in she is at [facility] now on [address] trying to get labs done need order sent

## 2020-05-08 NOTE — Telephone Encounter (Signed)
Reordered labs to be done at [facility]

## 2020-05-08 NOTE — Telephone Encounter (Signed)
Orders have been reordered for [facility].

## 2020-05-09 LAB — COMPREHENSIVE METABOLIC PANEL
ALT: 33 IU/L — ABNORMAL HIGH (ref 0–32)
AST: 32 IU/L (ref 0–40)
Albumin/Globulin Ratio: 2.6 — ABNORMAL HIGH (ref 1.2–2.2)
Albumin: 4.6 g/dL (ref 3.8–4.8)
Alkaline Phosphatase: 88 IU/L (ref 44–121)
BUN/Creatinine Ratio: 16 (ref 12–28)
BUN: 13 mg/dL (ref 8–27)
Bilirubin Total: 0.4 mg/dL (ref 0.0–1.2)
CO2: 23 mmol/L (ref 20–29)
Calcium: 9.4 mg/dL (ref 8.7–10.3)
Chloride: 104 mmol/L (ref 96–106)
Creatinine, Ser: 0.83 mg/dL (ref 0.57–1.00)
Globulin, Total: 1.8 g/dL (ref 1.5–4.5)
Glucose: 102 mg/dL — ABNORMAL HIGH (ref 65–99)
Potassium: 5.2 mmol/L (ref 3.5–5.2)
Sodium: 141 mmol/L (ref 134–144)
Total Protein: 6.4 g/dL (ref 6.0–8.5)
eGFR: 80 mL/min/{1.73_m2} (ref >59)

## 2020-05-09 LAB — HEMOGLOBIN A1C
Est. average glucose Bld gHb Est-mCnc: 126 mg/dL
Hgb A1c MFr Bld: 6 % — ABNORMAL HIGH (ref 4.8–5.6)

## 2020-05-09 LAB — LIPID PANEL
Chol/HDL Ratio: 3 ratio (ref 0.0–4.4)
Cholesterol, Total: 190 mg/dL (ref 100–199)
HDL: 63 mg/dL (ref >39)
LDL Chol Calc (NIH): 109 mg/dL — ABNORMAL HIGH (ref 0–99)
Triglycerides: 98 mg/dL (ref 0–149)
VLDL Cholesterol Cal: 18 mg/dL (ref 5–40)

## 2020-05-09 LAB — TSH: TSH: 0.537 u[IU]/mL (ref 0.450–4.500)

## 2020-05-10 ENCOUNTER — Encounter: Payer: Self-pay | Admitting: Internal Medicine

## 2020-05-10 ENCOUNTER — Ambulatory Visit (INDEPENDENT_AMBULATORY_CARE_PROVIDER_SITE_OTHER): Payer: No Typology Code available for payment source | Admitting: Internal Medicine

## 2020-05-10 ENCOUNTER — Other Ambulatory Visit: Payer: Self-pay

## 2020-05-10 ENCOUNTER — Other Ambulatory Visit: Payer: Self-pay | Admitting: Internal Medicine

## 2020-05-10 DIAGNOSIS — L409 Psoriasis, unspecified: Secondary | ICD-10-CM | POA: Diagnosis not present

## 2020-05-10 DIAGNOSIS — R7303 Prediabetes: Secondary | ICD-10-CM | POA: Diagnosis not present

## 2020-05-10 DIAGNOSIS — E669 Obesity, unspecified: Secondary | ICD-10-CM

## 2020-05-10 MED ORDER — BETAMETHASONE DIPROPIONATE 0.05 % EX OINT: TOPICAL_OINTMENT | Freq: Two times a day (BID) | CUTANEOUS | 0 refills | Status: DC

## 2020-05-10 MED ORDER — OZEMPIC (0.25 OR 0.5 MG/DOSE) 2 MG/1.5ML ~~LOC~~ SOPN: 0.2500 mg | PEN_INJECTOR | SUBCUTANEOUS | 0 refills | Status: DC

## 2020-05-10 MED FILL — OZEMPIC 0.25 OR 0.5 MG/DOSE: 2 | 28 days supply | Qty: 2 | Fill #0

## 2020-05-10 MED FILL — BETAMETHASONE DP 0.05% OINT: 0.05 | 7 days supply | Qty: 30 | Fill #0

## 2020-05-10 NOTE — Progress Notes (Signed)
Subjective:  Patient ID: Samantha Raymond, female    DOB: [birth year]  Age: 60 y.o. MRN: [id number]  CC: Diagnoses of Obesity (BMI 30-39.9), Prediabetes, and Scalp psoriasis were pertinent to this visit.  HPI Samantha Raymond presents for weight management  This visit occurred during the SARS-CoV-2 public health emergency.  Safety protocols were in place, including screening questions prior to the visit, additional usage of staff PPE, and extensive cleaning of exam room while observing appropriate contact time as indicated for disinfecting solutions.    She remains frustrated by her continued weight gain despite  "eating healthy" .  She was exercising regularly after Christmas but lost her momentum after taking a vacation to [place] and hasn't been able to get into a routine due to excessive work commitments and stress of family illness.  40 minutes was spent on this topic today.  Dietary choices and physical activity were reviewed in detail today, and barriers in approach and execution were identified and addressed.  Questions about various commercially advocated diet plans were answered.   Portion size reduction, attention to and restriction of "added sugar,"  as well the need for more consistent daily  exercise to improve insulin sensitivity and achieve a daily negative calorie balance were all discussed in detail.  She is interested in starting pharmacotherapy , specifically OVZCHY. Past trials of phentermine were effective but weight always regained  Weight.  There is no personal or FH of medullary thyroid cancer or MEN  Several pruritic lesions on occiptal scalp,  Wonders if it is scalp psoriasis suggested by sister 's same diagnosis.  Using topical clindamycin    Outpatient Medications Prior to Visit  Medication Sig Dispense Refill  . ADVAIR DISKUS 500-50 MCG/DOSE AEPB INHALE 1 PUFF 2 TIMES DAILY INTO THE LUNGS. RINSE MOUTH AFTER EACH USE TO PREVENT THRUSH 60 each 5  .  albuterol (PROVENTIL HFA;VENTOLIN HFA) 108 (90 Base) MCG/ACT inhaler Inhale 2 puffs into the lungs every 6 (six) hours as needed for wheezing or shortness of breath. 1 Inhaler 3  . albuterol (PROVENTIL) (2.5 MG/3ML) 0.083% nebulizer solution Take 3 mLs (2.5 mg total) by nebulization every 6 (six) hours as needed for wheezing or shortness of breath. 45 mL 1  . Cyanocobalamin (VITAMIN B-12) 1000 MCG SUBL Place under the tongue.    . escitalopram (LEXAPRO) 10 MG tablet TAKE 1 TABLET BY MOUTH ONCE DAILY 90 tablet 1  . fluticasone (FLONASE) 50 MCG/ACT nasal spray Place 1 spray into both nostrils daily.    Marland Kitchen levothyroxine (SYNTHROID) 25 MCG tablet TAKE 1 TABLET BY MOUTH DAILY BEFORE BREAKFAST. 90 tablet 0  . Multiple Vitamin (MULTIVITAMIN) tablet Take 1 tablet by mouth daily.    . ondansetron (ZOFRAN ODT) 4 MG disintegrating tablet Take 1 tablet (4 mg total) by mouth every 8 (eight) hours as needed for nausea. 20 tablet 0   No facility-administered medications prior to visit.    Review of Systems;  Patient denies headache, fevers, malaise, unintentional weight loss, skin rash, eye pain, sinus congestion and sinus pain, sore throat, dysphagia,  hemoptysis , cough, dyspnea, wheezing, chest pain, palpitations, orthopnea, edema, abdominal pain, nausea, melena, diarrhea, constipation, flank pain, dysuria, hematuria, urinary  Frequency, nocturia, numbness, tingling, seizures,  Focal weakness, Loss of consciousness,  Tremor, insomnia, depression, anxiety, and suicidal ideation.      Objective:  BP 132/72   Pulse 70   Temp 97.6 F (36.4 C) (Oral)   Ht 5\' 4"  (1.626 m)   Wt  222 lb 9.6 oz (101 kg)   SpO2 96%   BMI 38.21 kg/m   BP Readings from Last 3 Encounters:  05/10/20 132/72  11/08/19 126/70  12/07/18 122/78    Wt Readings from Last 3 Encounters:  05/10/20 222 lb 9.6 oz (101 kg)  02/19/20 217 lb (98.4 kg)  11/08/19 210 lb (95.3 kg)    General appearance: alert, cooperative and appears  stated age Ears: normal TM's and external ear canals both ears Scalp: several macular faintly erythematous areas on occipital scalp. No silvery placques , vesicles or or ulcerations Throat: lips, mucosa, and tongue normal; teeth and gums normal Neck: no adenopathy, no carotid bruit, supple, symmetrical, trachea midline and thyroid not enlarged, symmetric, no tenderness/mass/nodules Back: symmetric, no curvature. ROM normal. No CVA tenderness. Lungs: clear to auscultation bilaterally Heart: regular rate and rhythm, S1, S2 normal, no murmur, click, rub or gallop Abdomen: soft, non-tender; bowel sounds normal; no masses,  no organomegaly Pulses: 2+ and symmetric Skin: Skin color, texture, turgor normal. No rashes or lesions Lymph nodes: Cervical, supraclavicular, and axillary nodes normal. Psych: affect normal, makes good eye contact. No fidgeting,  Smiles easily.  Denies suicidal thoughts   Lab Results  Component Value Date   HGBA1C 6.0 (H) 05/08/2020   HGBA1C 5.9 (H) 11/08/2019   HGBA1C 5.8 (H) 11/03/2018    Lab Results  Component Value Date   CREATININE 0.83 05/08/2020   CREATININE 0.77 11/08/2019   CREATININE 0.83 11/03/2018    Lab Results  Component Value Date   WBC 4.2 11/03/2018   HGB 14.8 11/03/2018   HCT 42.5 11/03/2018   PLT 245 11/03/2018   GLUCOSE 102 (H) 05/08/2020   CHOL 190 05/08/2020   TRIG 98 05/08/2020   HDL 63 05/08/2020   LDLCALC 109 (H) 05/08/2020   ALT 33 (H) 05/08/2020   AST 32 05/08/2020   NA 141 05/08/2020   K 5.2 05/08/2020   CL 104 05/08/2020   CREATININE 0.83 05/08/2020   BUN 13 05/08/2020   CO2 23 05/08/2020   TSH 0.537 05/08/2020   HGBA1C 6.0 (H) 05/08/2020    MM 3D SCREEN BREAST BILATERAL  Result Date: 11/30/2019 CLINICAL DATA:  Screening. EXAM: DIGITAL SCREENING BILATERAL MAMMOGRAM WITH TOMO AND CAD COMPARISON:  Previous exam(s). ACR Breast Density Category b: There are scattered areas of fibroglandular density. FINDINGS: There are no  findings suspicious for malignancy. Images were processed with CAD. IMPRESSION: No mammographic evidence of malignancy. A result letter of this screening mammogram will be mailed directly to the patient. RECOMMENDATION: Screening mammogram in one year. (Code:SM-B-01Y) BI-RADS CATEGORY  1: Negative. Electronically Signed   By: Evangeline Dakin M.D.   On: 11/30/2019 15:45    Assessment & Plan:   Problem List Items Addressed This Visit      Unprioritized   Obesity (BMI 30-39.9)    She has been researching Wegovy and would like to start therapy for long term use..  Risks and benefits of medication use discussed. Medication was prescribed as Ozempic given her history of prediabetes.  Lab Results  Component Value Date   HGBA1C 6.0 (H) 05/08/2020          Relevant Medications   Semaglutide,0.25 or 0.5MG /DOS, (OZEMPIC, 0.25 OR 0.5 MG/DOSE,) 2 MG/1.5ML SOPN   Scalp psoriasis    Presumed, based on exam and family history.  Trial of bethamethasone topical,  Follow up with dermatology      Prediabetes    Her fasting glucose is not diagnostic of  diabetes, but she is at risk for diabetes.  Trial of Ozempic advised. Along with a low glycemic index diet and regular  participation in an aerobic  exercise activity.  We will check an A1c in 6 months.   Lab Results  Component Value Date   HGBA1C 6.0 (H) 05/08/2020            I am having Reginia Naas start on betamethasone dipropionate and Ozempic (0.25 or 0.5 MG/DOSE). I am also having her maintain her multivitamin, ondansetron, fluticasone, albuterol, albuterol, escitalopram, Advair Diskus, Vitamin B-12, and levothyroxine.  Meds ordered this encounter  Medications  . betamethasone dipropionate (DIPROLENE) 0.05 % ointment    Sig: Apply topically 2 (two) times daily.    Dispense:  30 g    Refill:  0  . Semaglutide,0.25 or 0.5MG /DOS, (OZEMPIC, 0.25 OR 0.5 MG/DOSE,) 2 MG/1.5ML SOPN    Sig: Inject 0.25 mg into the skin once a week.     Dispense:  1.5 mL    Refill:  0  A total of 40 minutes was spent with patient more than half of which was spent in counseling patient on the above mentioned issues , reviewing and explaining recent labs and imaging studies done, and coordination of care.  There are no discontinued medications.  Follow-up: No follow-ups on file.   Crecencio Mc, MD

## 2020-05-12 DIAGNOSIS — L409 Psoriasis, unspecified: Secondary | ICD-10-CM | POA: Insufficient documentation

## 2020-05-12 NOTE — Assessment & Plan Note (Signed)
Presumed, based on exam and family history.  Trial of bethamethasone topical,  Follow up with dermatology

## 2020-05-12 NOTE — Assessment & Plan Note (Signed)
She has been researching [phone] and would like to start therapy for long term use..  Risks and benefits of medication use discussed. Medication was prescribed as Ozempic given her history of prediabetes.  Lab Results  Component Value Date   HGBA1C 6.0 (H) 05/08/2020

## 2020-05-12 NOTE — Assessment & Plan Note (Signed)
Her fasting glucose is not diagnostic of diabetes, but she is at risk for diabetes.  Trial of Ozempic advised. Along with a low glycemic index diet and regular  participation in an aerobic  exercise activity.  We will check an A1c in 6 months.   Lab Results  Component Value Date   HGBA1C 6.0 (H) 05/08/2020

## 2020-05-17 ENCOUNTER — Ambulatory Visit (AMBULATORY_SURGERY_CENTER): Payer: No Typology Code available for payment source | Admitting: Gastroenterology

## 2020-05-17 ENCOUNTER — Other Ambulatory Visit: Payer: Self-pay

## 2020-05-17 ENCOUNTER — Encounter: Payer: Self-pay | Admitting: Gastroenterology

## 2020-05-17 VITALS — BP 119/87 | HR 68 | Temp 95.9°F | Resp 19 | Ht 64.0 in | Wt 217.0 lb

## 2020-05-17 DIAGNOSIS — Z1211 Encounter for screening for malignant neoplasm of colon: Secondary | ICD-10-CM | POA: Diagnosis not present

## 2020-05-17 MED ORDER — SODIUM CHLORIDE 0.9 % IV SOLN: 500.0000 mL | Freq: Once | INTRAVENOUS | Status: DC

## 2020-05-17 NOTE — Progress Notes (Signed)
Medical history reviewed with no changes noted. VS assessed by C.W 

## 2020-05-17 NOTE — Progress Notes (Signed)
PT taken to PACU. Monitors in place. VSS. Report given to RN. 

## 2020-05-17 NOTE — Op Note (Signed)
[facility] Patient Name: Samantha Raymond Procedure Date: 05/17/2020 3:31 PM MRN: [id number] Endoscopist: Thornton Park MD, MD Age: 60 Referring MD:  Date of Birth: 29-Jan-1961 Gender: Female Account #: [id number] Procedure:                Colonoscopy Indications:              Screening for colorectal malignant neoplasm                           Normal colonoscopy 2012 with Dr. Candace Cruise                           No known family history of colon cancer or polyps Medicines:                Monitored Anesthesia Care Procedure:                Pre-Anesthesia Assessment:                           - Prior to the procedure, a History and Physical                            was performed, and patient medications and                            allergies were reviewed. The patient's tolerance of                            previous anesthesia was also reviewed. The risks                            and benefits of the procedure and the sedation                            options and risks were discussed with the patient.                            All questions were answered, and informed consent                            was obtained. Prior Anticoagulants: The patient has                            taken no previous anticoagulant or antiplatelet                            agents. ASA Grade Assessment: II - A patient with                            mild systemic disease. After reviewing the risks                            and benefits, the patient was deemed in  satisfactory condition to undergo the procedure.                           After obtaining informed consent, the colonoscope                            was passed under direct vision. Throughout the                            procedure, the patient's blood pressure, pulse, and                            oxygen saturations were monitored continuously. The                            Olympus [id number])  Colonoscope was                            introduced through the anus and advanced to the 3                            cm into the ileum. A second forward view of the                            right colon was performed. The colonoscopy was                            performed without difficulty. The patient tolerated                            the procedure well. The quality of the bowel                            preparation was good. The terminal ileum, ileocecal                            valve, appendiceal orifice, and rectum were                            photographed. Scope In: 3:43:21 PM Scope Out: 3:58:50 PM Scope Withdrawal Time: 0 hours 12 minutes 0 seconds  Total Procedure Duration: 0 hours 15 minutes 29 seconds  Findings:                 The perianal and digital rectal examinations were                            normal.                           The entire examined colon appeared normal on direct                            and retroflexion views. Complications:            No immediate  complications. Estimated Blood Loss:     Estimated blood loss: none. Impression:               - The entire examined colon is normal. Recommendation:           - Patient has a contact number available for                            emergencies. The signs and symptoms of potential                            delayed complications were discussed with the                            patient. Return to normal activities tomorrow.                            Written discharge instructions were provided to the                            patient.                           - Resume previous diet.                           - Continue present medications.                           - Repeat colonoscopy in 10 years for surveillanc,                            earlier with new symptoms.                           - Emerging evidence supports eating a diet of                            fruits, vegetables,  grains, calcium, and yogurt                            while reducing red meat and alcohol may reduce the                            risk of colon cancer.                           - Thank you for allowing me to be involved in your                            colon cancer prevention. Thornton Park MD, MD 05/17/2020 4:03:23 PM This report has been signed electronically.

## 2020-05-17 NOTE — Patient Instructions (Signed)
Resume previous diet Continue current medications Repeat colonoscopy in 10 years !!!!  YOU HAD AN ENDOSCOPIC PROCEDURE TODAY AT [facility]:   Refer to the procedure report that was given to you for any specific questions about what was found during the examination.  If the procedure report does not answer your questions, please call your gastroenterologist to clarify.  If you requested that your care partner not be given the details of your procedure findings, then the procedure report has been included in a sealed envelope for you to review at your convenience later.  YOU SHOULD EXPECT: Some feelings of bloating in the abdomen. Passage of more gas than usual.  Walking can help get rid of the air that was put into your GI tract during the procedure and reduce the bloating. If you had a lower endoscopy (such as a colonoscopy or flexible sigmoidoscopy) you may notice spotting of blood in your stool or on the toilet paper. If you underwent a bowel prep for your procedure, you may not have a normal bowel movement for a few days.  Please Note:  You might notice some irritation and congestion in your nose or some drainage.  This is from the oxygen used during your procedure.  There is no need for concern and it should clear up in a day or so.  SYMPTOMS TO REPORT IMMEDIATELY:   Following lower endoscopy (colonoscopy or flexible sigmoidoscopy):  Excessive amounts of blood in the stool  Significant tenderness or worsening of abdominal pains  Swelling of the abdomen that is new, acute  Fever of 100F or higher For urgent or emergent issues, a gastroenterologist can be reached at any hour by calling [phone]. Do not use MyChart messaging for urgent concerns.   DIET:  We do recommend a small meal at first, but then you may proceed to your regular diet.  Drink plenty of fluids but you should avoid alcoholic beverages for 24 hours.  ACTIVITY:  You should plan to take it easy for the  rest of today and you should NOT DRIVE or use heavy machinery until tomorrow (because of the sedation medicines used during the test).    FOLLOW UP: Our staff will call the number listed on your records 48-72 hours following your procedure to check on you and address any questions or concerns that you may have regarding the information given to you following your procedure. If we do not reach you, we will leave a message.  We will attempt to reach you two times.  During this call, we will ask if you have developed any symptoms of COVID 19. If you develop any symptoms (ie: fever, flu-like symptoms, shortness of breath, cough etc.) before then, please call [phone].  If you test positive for Covid 19 in the 2 weeks post procedure, please call and report this information to [place].    If any biopsies were taken you will be contacted by phone or by letter within the next 1-3 weeks.  Please call us at [phone] if you have not heard about the biopsies in 3 weeks.   SIGNATURES/CONFIDENTIALITY: You and/or your care partner have signed paperwork which will be entered into your electronic medical record.  These signatures attest to the fact that that the information above on your After Visit Summary has been reviewed and is understood.  Full responsibility of the confidentiality of this discharge information lies with you and/or your care-partner.

## 2020-05-21 ENCOUNTER — Telehealth: Payer: Self-pay | Admitting: *Deleted

## 2020-05-21 NOTE — Telephone Encounter (Signed)
  Follow up Call-  Call back number 05/17/2020  Post procedure Call Back phone  # [phone]  Permission to leave phone message Yes  Some recent data might be hidden     Patient questions:  Do you have a fever, pain , or abdominal swelling? No. Pain Score  0 *  Have you tolerated food without any problems? Yes.    Have you been able to return to your normal activities? Yes.    Do you have any questions about your discharge instructions: Diet   No. Medications  No. Follow up visit  No.  Do you have questions or concerns about your Care? No.  Actions: * If pain score is 4 or above: No action needed, pain <4.  1. Have you developed a fever since your procedure? no  2.   Have you had an respiratory symptoms (SOB or cough) since your procedure? no  3.   Have you tested positive for COVID 19 since your procedure no  4.   Have you had any family members/close contacts diagnosed with the COVID 19 since your procedure?  no   If yes to any of these questions please route to Joylene John, RN and Joella Prince, RN

## 2020-06-18 ENCOUNTER — Other Ambulatory Visit (HOSPITAL_COMMUNITY): Payer: Self-pay

## 2020-06-18 ENCOUNTER — Other Ambulatory Visit: Payer: Self-pay | Admitting: Internal Medicine

## 2020-06-18 ENCOUNTER — Ambulatory Visit (INDEPENDENT_AMBULATORY_CARE_PROVIDER_SITE_OTHER): Payer: No Typology Code available for payment source | Admitting: Family Medicine

## 2020-06-18 ENCOUNTER — Other Ambulatory Visit: Payer: Self-pay

## 2020-06-18 ENCOUNTER — Encounter (INDEPENDENT_AMBULATORY_CARE_PROVIDER_SITE_OTHER): Payer: Self-pay | Admitting: Family Medicine

## 2020-06-18 VITALS — BP 112/72 | HR 63 | Temp 98.4°F | Ht 63.0 in | Wt 213.0 lb

## 2020-06-18 DIAGNOSIS — E538 Deficiency of other specified B group vitamins: Secondary | ICD-10-CM

## 2020-06-18 DIAGNOSIS — R4589 Other symptoms and signs involving emotional state: Secondary | ICD-10-CM | POA: Insufficient documentation

## 2020-06-18 DIAGNOSIS — R0602 Shortness of breath: Secondary | ICD-10-CM

## 2020-06-18 DIAGNOSIS — F3289 Other specified depressive episodes: Secondary | ICD-10-CM

## 2020-06-18 DIAGNOSIS — E038 Other specified hypothyroidism: Secondary | ICD-10-CM | POA: Diagnosis not present

## 2020-06-18 DIAGNOSIS — Z0289 Encounter for other administrative examinations: Secondary | ICD-10-CM

## 2020-06-18 DIAGNOSIS — Z9189 Other specified personal risk factors, not elsewhere classified: Secondary | ICD-10-CM | POA: Diagnosis not present

## 2020-06-18 DIAGNOSIS — Z6837 Body mass index (BMI) 37.0-37.9, adult: Secondary | ICD-10-CM

## 2020-06-18 DIAGNOSIS — R5383 Other fatigue: Secondary | ICD-10-CM | POA: Diagnosis not present

## 2020-06-18 DIAGNOSIS — R7303 Prediabetes: Secondary | ICD-10-CM | POA: Diagnosis not present

## 2020-06-18 DIAGNOSIS — R0609 Other forms of dyspnea: Secondary | ICD-10-CM | POA: Insufficient documentation

## 2020-06-18 DIAGNOSIS — K760 Fatty (change of) liver, not elsewhere classified: Secondary | ICD-10-CM | POA: Insufficient documentation

## 2020-06-18 MED ORDER — OZEMPIC (0.25 OR 0.5 MG/DOSE) 2 MG/1.5ML ~~LOC~~ SOPN
0.2500 mg | PEN_INJECTOR | SUBCUTANEOUS | 0 refills | Status: DC
  Filled 2020-06-18: qty 1.5, 56d supply, fill #0
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
## 2020-06-19 ENCOUNTER — Other Ambulatory Visit (HOSPITAL_COMMUNITY): Payer: Self-pay

## 2020-06-19 LAB — CBC WITH DIFFERENTIAL/PLATELET
Basophils Absolute: 0 10*3/uL (ref 0.0–0.2)
Basos: 1 %
EOS (ABSOLUTE): 0 10*3/uL (ref 0.0–0.4)
Eos: 1 %
Hematocrit: 43.8 % (ref 34.0–46.6)
Hemoglobin: 14.4 g/dL (ref 11.1–15.9)
Immature Grans (Abs): 0 10*3/uL (ref 0.0–0.1)
Immature Granulocytes: 0 %
Lymphocytes Absolute: 1.5 10*3/uL (ref 0.7–3.1)
Lymphs: 34 %
MCH: 29.7 pg (ref 26.6–33.0)
MCHC: 32.9 g/dL (ref 31.5–35.7)
MCV: 90 fL (ref 79–97)
Monocytes Absolute: 0.4 10*3/uL (ref 0.1–0.9)
Monocytes: 8 %
Neutrophils Absolute: 2.4 10*3/uL (ref 1.4–7.0)
Neutrophils: 56 %
Platelets: 236 10*3/uL (ref 150–450)
RBC: 4.85 x10E6/uL (ref 3.77–5.28)
RDW: 12.8 % (ref 11.7–15.4)
WBC: 4.3 10*3/uL (ref 3.4–10.8)

## 2020-06-19 LAB — VITAMIN B12: Vitamin B-12: 1422 pg/mL — ABNORMAL HIGH (ref 232–1245)

## 2020-06-19 LAB — VITAMIN D 25 HYDROXY (VIT D DEFICIENCY, FRACTURES): Vit D, 25-Hydroxy: 19.3 ng/mL — ABNORMAL LOW (ref 30.0–100.0)

## 2020-06-19 LAB — FOLATE: Folate: 10.4 ng/mL (ref >3.0)

## 2020-06-19 LAB — INSULIN, RANDOM: INSULIN: 9.8 u[IU]/mL (ref 2.6–24.9)

## 2020-06-25 ENCOUNTER — Telehealth (INDEPENDENT_AMBULATORY_CARE_PROVIDER_SITE_OTHER): Payer: No Typology Code available for payment source | Admitting: Psychology

## 2020-06-25 DIAGNOSIS — F5089 Other specified eating disorder: Secondary | ICD-10-CM

## 2020-06-25 DIAGNOSIS — F32A Depression, unspecified: Secondary | ICD-10-CM

## 2020-06-25 NOTE — Progress Notes (Signed)
  Office: [phone]  /  Fax: [phone]    Date: Jul 09, 2020   Appointment Start Time: 11:36am Duration: 25 minutes Provider: Glennie Isle, Psy.D. Type of Session: Individual Therapy  Location of Patient: Work Public librarian) Location of Provider: Provider's home (private office) Type of Contact: Telepsychological Visit via MyChart Video Visit  Session Content: Samantha Raymond is a 60 y.o. female presenting for a follow-up appointment to address the previously established treatment goal of increasing coping skills. Today's appointment was a telepsychological visit due to COVID-19. [name] provided verbal consent for today's telepsychological appointment and she is aware she is responsible for securing confidentiality on her end of the session. Prior to proceeding with today's appointment, [name]'s physical location at the time of this appointment was obtained as well a phone number she could be reached at in the event of technical difficulties. [name] and this provider participated in today's telepsychological service.   This provider conducted a brief check-in. [name] shared about recent events, including her trip to [place], which she noted she enjoyed. She acknowledged deviations from the meal plan while traveling. [name] also provided updates about her grandson's health. Associated thoughts and feelings were processed. Notably, [name] discussed taking food with her to the hospital when staying with her grandson; positive reinforcement was provided. Psychoeducation regarding the importance of self-care utilizing the oxygen mask metaphor was provided. Additionally, psychoeducation regarding pleasurable activities, including its impact on emotional eating and overall well-being was provided. [name] was provided with a handout with various options of pleasurable activities, and was encouraged to engage in one activity a day and additional activities as needed when triggered to emotionally eat. [name] agreed. [name]  provided verbal consent during today's appointment for this provider to send a handout with pleasurable activities via e-mail. Furthermore, psychoeducation provided regarding all or nothing thinking. Overall, [name] was receptive to today's appointment as evidenced by openness to sharing, responsiveness to feedback, and willingness to engage in pleasurable activities to assist with coping.  Mental Status Examination:  Appearance: well groomed and appropriate hygiene  Behavior: appropriate to circumstances Mood: euthymic Affect: mood congruent Speech: normal in rate, volume, and tone Eye Contact: appropriate Psychomotor Activity: unable to assess  Gait: unable to assess Thought Process: linear, logical, and goal directed  Thought Content/Perception: no hallucinations, delusions, bizarre thinking or behavior reported or observed and no evidence or endorsement of suicidal and homicidal ideation, plan, and intent Orientation: time, person, place, and purpose of appointment Memory/Concentration: memory, attention, language, and fund of knowledge intact  Insight/Judgment: fair  Interventions:  Conducted a brief chart review Provided empathic reflections and validation Employed supportive psychotherapy interventions to facilitate reduced distress and to improve coping skills with identified stressors Psychoeducation provided regarding pleasurable activities Psychoeducation provided regarding self-care  Psychoeducation provided regarding all or nothing thinking   DSM-5 Diagnosis(es): F50.89 Other Specified Feeding or Eating Disorder, Emotional Eating Behaviors and F32.A Unspecified Depressive Disorder   Treatment Goal & Progress: During the initial appointment with this provider, the following treatment goal was established: increase coping skills. Progress is limited, as [name] has just begun treatment with this provider; however, she is receptive to the interaction and interventions and rapport is  being established.   Plan: The next appointment will be scheduled in three weeks, which will be via MyChart Video Visit. The next session will focus on working towards the established treatment goal.

## 2020-06-26 NOTE — Progress Notes (Signed)
Chief Complaint:   OBESITY Samantha Raymond (MR# [id number]) is a 60 y.o. female who presents for evaluation and treatment of obesity and related comorbidities. Current BMI is Body mass index is 37.73 kg/m. Samantha Raymond has been struggling with her weight for many years and has been unsuccessful in either losing weight, maintaining weight loss, or reaching her healthy weight goal.  Samantha Raymond is currently in the action stage of change and ready to dedicate time achieving and maintaining a healthier weight. Samantha Raymond is interested in becoming our patient and working on intensive lifestyle modifications including (but not limited to) diet and exercise for weight loss.  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  She lives with her husband, Samantha Raymond.  Eats out Monday through Friday for lunch.  Likes sweets, chips, popcorn, cookies, pasta.  Worst habit is emotional eating, binge eating - only once a week or less.  Samantha Raymond's habits were reviewed today and are as follows: Her family eats meals together, she thinks her family will eat healthier with her, she struggles with family and or coworkers weight loss sabotage, her desired weight loss is 63 pounds, she started gaining weight after menopause/divorce, her heaviest weight ever was 213 pounds, she craves sweets, chips, popcorn, cookies, pasta, she snacks frequently in the evenings, she skips breakfast or lunch frequently and she struggles with emotional eating.  Depression Screen Samantha Raymond's Food and Mood (modified PHQ-9) score was 12.  Depression screen PHQ 2/9 06/18/2020  Decreased Interest 2  Down, Depressed, Hopeless 3  PHQ - 2 Score 5  Altered sleeping 2  Tired, decreased energy 3  Change in appetite 2  Feeling bad or failure about yourself  0  Trouble concentrating 0  Moving slowly or fidgety/restless 0  Suicidal thoughts 0  PHQ-9 Score 12  Difficult doing work/chores -   Assessment/Plan:   Orders Placed This Encounter   Procedures  . Vitamin B12  . CBC with Differential/Platelet  . Folate  . Insulin, random  . VITAMIN D 25 Hydroxy (Vit-D Deficiency, Fractures)  . EKG 12-Lead   Medications Discontinued During This Encounter  Medication Reason  . betamethasone dipropionate (DIPROLENE) 0.05 % ointment   . Na Sulfate-K Sulfate-Mg Sulf 17.5-3.13-1.6 GM/177ML SOLN   . COVID-19 mRNA vaccine, Pfizer, 30 MCG/0.3ML injection   . Zoster Vaccine Adjuvanted [facility]) injection   . fluticasone (FLONASE) 50 MCG/ACT nasal spray   . Multiple Vitamin (MULTIVITAMIN) tablet    1. Other fatigue Samantha Raymond reports daytime somnolence and denies waking up still tired. Patent has a history of symptoms of daytime fatigue and snoring. Samantha Raymond generally gets 7 or 8 hours of sleep per night, and states that she has generally restful sleep. Snoring is present. Apneic episodes are not present. Epworth Sleepiness Score is 10.  Samantha Raymond does feel that her weight is causing her energy to be lower than it should be. Fatigue may be related to obesity, depression or many other causes. Labs will be ordered, and in the meanwhile, Zarria will focus on self care including making healthy food choices, increasing physical activity and focusing on stress reduction.  I told her to follow up regarding possible OSA evaluation.  Will check EKG and labs today.  - EKG 12-Lead - CBC with Differential/Platelet - Folate - VITAMIN D 25 Hydroxy (Vit-D Deficiency, Fractures)  2. SOBOE (shortness of breath on exertion) Samantha Raymond notes increasing shortness of breath with exercising and seems to be worsening over time with weight gain. She notes getting out of  breath sooner with activity than she used to. This has gotten worse recently. Samantha Raymond denies shortness of breath at rest or orthopnea.  Samantha Raymond does feel that she gets out of breath more easily that she used to when she exercises. Samantha Raymond's shortness of breath appears to be obesity related and exercise induced. She has  agreed to work on weight loss and gradually increase exercise to treat her exercise induced shortness of breath. Will continue to monitor closely.  Will check IC and labs today.  - CBC with Differential/Platelet - Folate - VITAMIN D 25 Hydroxy (Vit-D Deficiency, Fractures)  3. Prediabetes Not at goal. Goal is HgbA1c < 5.7.  Medication: Ozempic 0.25 mg subcutaneously weekly.  She has been on it for 6 weeks.  Diagnosed 3-5 years ago.  Father passed at 58 years old from brittle diabetes.  Plan:  She will continue to focus on protein-rich, low simple carbohydrate foods. We reviewed the importance of hydration, regular exercise for stress reduction, and restorative sleep. Will check insulin level today.  Lab Results  Component Value Date   HGBA1C 6.0 (H) 05/08/2020   Lab Results  Component Value Date   INSULIN 9.8 06/18/2020   - Insulin, random  4. Other specified hypothyroidism Course: Stable. Medication: Synthroid 25 mcg daily.   Plan: Patient was instructed not to take MVM or iron within 4 hours of taking thyroid medications. This issue is managed by her PCP. We will continue to monitor alongside Endocrinology/PCP as it relates to her weight loss journey.   Lab Results  Component Value Date   TSH 0.537 05/08/2020   5. Nonalcoholic hepatosteatosis NAFLD is an umbrella term that encompasses a disease spectrum that includes steatosis (fat) without inflammation, steatohepatitis (NASH; fat + inflammation in a characteristic pattern), and cirrhosis. Bland steatosis is felt to be a benign condition, with extremely low to no risk of progression to cirrhosis, whereas NASH can progress to cirrhosis. The mainstay of treatment of NAFLD includes lifestyle modification to achieve weight loss, at least 7% of current body weight. Low carbohydrate diets can be beneficial in improving NAFLD liver histology. Additionally, exercise, even the absence of weight loss can have beneficial effects on the  patient's metabolic profile and liver health.   6. Vitamin B 12 deficiency Lab Results  Component Value Date   VITAMINB12 1,422 (H) 06/18/2020   Supplementation: OTC vitamin B12 1000 mcg daily.   Plan:  Continue current treatment.  Will check vitamin B12 level today, as per below.  - Vitamin B12  7. Other depression, with emotional eating Not at goal. Medication: Lexapro 10 mg daily.  She gets counseling through [facility], [facility].  PHQ-9 is 12.    Plan:  Continue counseling.  Behavior modification techniques were discussed today to help deal with emotional/non-hunger eating behaviors.  8. At risk for impaired metabolic function Due to Kaylene's current state of health and medical condition(s), she is at a significantly higher risk for impaired metabolic function.   At least 23 minutes was spent on counseling Samantha Raymond about these concerns today.  This places the patient at a much greater risk to subsequently develop cardio-pulmonary conditions that can negatively affect the patient's quality of life.  I stressed the importance of reversing these risks factors.  The initial goal is to lose at least 5-10% of starting weight to help reduce risk factors.  Counseling:  Intensive lifestyle modifications discussed with Samantha Raymond as the most appropriate first line treatment.  she will continue to work on diet, exercise,  and weight loss efforts.  We will continue to reassess these conditions on a fairly regular basis in an attempt to decrease the patient's overall morbidity and mortality.  9. Class 2 severe obesity with serious comorbidity and body mass index (BMI) of 37.0 to 37.9 in adult, unspecified obesity type [facility])  Samantha Raymond is currently in the action stage of change and her goal is to continue with weight loss efforts. I recommend Samantha Raymond begin the structured treatment plan as follows:  She has agreed to the Category 2 Plan.  Exercise goals: No exercise has been prescribed at this time.   Behavioral  modification strategies: increasing lean protein intake, decreasing simple carbohydrates, decreasing eating out, no skipping meals and planning for success.  She was informed of the importance of frequent follow-up visits to maximize her success with intensive lifestyle modifications for her multiple health conditions. She was informed we would discuss her lab results at her next visit unless there is a critical issue that needs to be addressed sooner. Samantha Raymond agreed to keep her next visit at the agreed upon time to discuss these results.  Objective:   Blood pressure 112/72, pulse 63, temperature 98.4 F (36.9 C), height 5\' 3"  (1.6 m), weight 213 lb (96.6 kg), SpO2 98 %. Body mass index is 37.73 kg/m.  EKG: Normal sinus rhythm, rate 64 bpm.  Indirect Calorimeter completed today shows a VO2 of 275 and a REE of 1914.  Her calculated basal metabolic rate is 7106 thus her basal metabolic rate is better than expected.  General: Cooperative, alert, well developed, in no acute distress. HEENT: Conjunctivae and lids unremarkable. Cardiovascular: Regular rhythm.  Lungs: Normal work of breathing. Neurologic: No focal deficits.   Lab Results  Component Value Date   CREATININE 0.83 05/08/2020   BUN 13 05/08/2020   NA 141 05/08/2020   K 5.2 05/08/2020   CL 104 05/08/2020   CO2 23 05/08/2020   Lab Results  Component Value Date   ALT 33 (H) 05/08/2020   AST 32 05/08/2020   ALKPHOS 88 05/08/2020   BILITOT 0.4 05/08/2020   Lab Results  Component Value Date   HGBA1C 6.0 (H) 05/08/2020   HGBA1C 5.9 (H) 11/08/2019   HGBA1C 5.8 (H) 11/03/2018   HGBA1C 5.8 (H) 10/28/2017   HGBA1C 5.8 (H) 03/19/2017   Lab Results  Component Value Date   INSULIN 9.8 06/18/2020   Lab Results  Component Value Date   TSH 0.537 05/08/2020   Lab Results  Component Value Date   CHOL 190 05/08/2020   HDL 63 05/08/2020   LDLCALC 109 (H) 05/08/2020   TRIG 98 05/08/2020   CHOLHDL 3.0 05/08/2020   Lab Results   Component Value Date   WBC 4.3 06/18/2020   HGB 14.4 06/18/2020   HCT 43.8 06/18/2020   MCV 90 06/18/2020   PLT 236 06/18/2020   Lab Results  Component Value Date   IRON 113 11/03/2013   TIBC 350 11/03/2013   FERRITIN 128 08/31/2014   Attestation Statements:   This is the patient's first visit at [facility]. The patient's NEW PATIENT PACKET was reviewed at length. Included in the packet: current and past health history, medications, allergies, ROS, gynecologic history (women only), surgical history, family history, social history, weight history, weight loss surgery history (for those that have had weight loss surgery), nutritional evaluation, mood and food questionnaire, PHQ9, Epworth questionnaire, sleep habits questionnaire, patient life and health improvement goals questionnaire. These will all be scanned into  the patient's chart under media.   During the visit, I independently reviewed the patient's EKG, bioimpedance scale results, and indirect calorimeter results. I used this information to tailor a meal plan for the patient that will help her to lose weight and will improve her obesity-related conditions going forward. I performed a medically necessary appropriate examination and/or evaluation. I discussed the assessment and treatment plan with the patient. The patient was provided an opportunity to ask questions and all were answered. The patient agreed with the plan and demonstrated an understanding of the instructions. Labs were ordered at this visit and will be reviewed at the next visit unless more critical results need to be addressed immediately. Clinical information was updated and documented in the EMR.   I, Water quality scientist, CMA, am acting as Location manager for [employer], DO.  I have reviewed the above documentation for accuracy and completeness, and I agree with the above. Marjory Sneddon, D.O.  The [place] was signed into law in 2016  which includes the topic of electronic health records.  This provides immediate access to information in MyChart.  This includes consultation notes, operative notes, office notes, lab results and pathology reports.  If you have any questions about what you read please let us know at your next visit so we can discuss your concerns and take corrective action if need be.  We are right here with you.

## 2020-07-02 ENCOUNTER — Other Ambulatory Visit (HOSPITAL_COMMUNITY): Payer: Self-pay

## 2020-07-02 ENCOUNTER — Encounter (INDEPENDENT_AMBULATORY_CARE_PROVIDER_SITE_OTHER): Payer: Self-pay | Admitting: Family Medicine

## 2020-07-02 ENCOUNTER — Other Ambulatory Visit: Payer: Self-pay

## 2020-07-02 ENCOUNTER — Ambulatory Visit (INDEPENDENT_AMBULATORY_CARE_PROVIDER_SITE_OTHER): Payer: No Typology Code available for payment source | Admitting: Family Medicine

## 2020-07-02 VITALS — BP 128/73 | HR 60 | Temp 98.1°F | Ht 63.0 in | Wt 213.0 lb

## 2020-07-02 DIAGNOSIS — R7303 Prediabetes: Secondary | ICD-10-CM

## 2020-07-02 DIAGNOSIS — E538 Deficiency of other specified B group vitamins: Secondary | ICD-10-CM | POA: Diagnosis not present

## 2020-07-02 DIAGNOSIS — Z6837 Body mass index (BMI) 37.0-37.9, adult: Secondary | ICD-10-CM

## 2020-07-02 DIAGNOSIS — E559 Vitamin D deficiency, unspecified: Secondary | ICD-10-CM | POA: Diagnosis not present

## 2020-07-02 MED ORDER — VITAMIN D (ERGOCALCIFEROL) 1.25 MG (50000 UNIT) PO CAPS
50000.0000 [IU] | ORAL_CAPSULE | ORAL | 0 refills | Status: DC
  Filled 2020-07-02: qty 4, 28d supply, fill #0

## 2020-07-03 ENCOUNTER — Other Ambulatory Visit: Payer: Self-pay | Admitting: Internal Medicine

## 2020-07-03 ENCOUNTER — Other Ambulatory Visit (HOSPITAL_COMMUNITY): Payer: Self-pay

## 2020-07-03 MED ORDER — ESCITALOPRAM OXALATE 10 MG PO TABS
ORAL_TABLET | Freq: Every day | ORAL | 1 refills | Status: DC
  Filled 2020-07-03: qty 90, 90d supply, fill #0
  Filled 2020-09-03: qty 90, 90d supply, fill #1

## 2020-07-04 ENCOUNTER — Other Ambulatory Visit (HOSPITAL_COMMUNITY): Payer: Self-pay

## 2020-07-09 ENCOUNTER — Telehealth (INDEPENDENT_AMBULATORY_CARE_PROVIDER_SITE_OTHER): Payer: No Typology Code available for payment source | Admitting: Psychology

## 2020-07-09 DIAGNOSIS — F5089 Other specified eating disorder: Secondary | ICD-10-CM

## 2020-07-09 DIAGNOSIS — F32A Depression, unspecified: Secondary | ICD-10-CM

## 2020-07-09 NOTE — Progress Notes (Signed)
Chief Complaint:   OBESITY Samantha Raymond is here to discuss her progress with her obesity treatment plan along with follow-up of her obesity related diagnoses.   Today's visit was #: 2 Starting weight: 213 lbs Starting date: 06/18/2020 Today's weight: 213 lbs Today's date: 07/02/2020 Weight change since last visit: 0 Total lbs lost to date: 0 Body mass index is 37.73 kg/m.   Interim History:  Samantha Raymond is here today for her first follow-up office visit since starting the program with [place].  All blood work/ lab tests that were recently ordered by myself or an outside provider were reviewed with patient today per their request.   Extended time was spent counseling her on all new disease processes that were discovered or preexisting ones that are worsening.  she understands that many of these abnormalities will need to monitored regularly along with the current treatment plan of prudent dietary changes, in which we are making each and every office visit, to improve these health parameters.  We reviewed her new meal plan in detail and questions were answered.  Patient's food recall appears to be accurate and consistent with what is on plan when she is following it.   When eating on plan, her hunger and cravings are well controlled.    Samantha Raymond endorses some hunger at dinnertime.  For snack calories, creamer and [place] bar.  She did really well in the beginning, but traveled last week and weekend and gained it all back, she says.  Plan:  Options for higher protein snack (Protein Equivalents sheet given) for mid-afternoon discussed and highly encouraged.  Current Meal Plan: the Category 2 Plan for 70% of the time.  Current Exercise Plan: None. Current Anti-Obesity Medications: Ozempic 0.25 mg subcutaneously weekly. Side effects: None.  Assessment/Plan:   Meds ordered this encounter  Medications  . DISCONTD: Vitamin D, Ergocalciferol, (DRISDOL) 1.25 MG (50000 UNIT) CAPS capsule    Sig:  Take 1 capsule mouth every 7 days.    Dispense:  4 capsule    Refill:  0    1. B12 deficiency Lab Results  Component Value Date   VITAMINB12 1,422 (H) 06/18/2020   Supplementation: OTC vitamin B12 1000 mcg daily.   Plan:  Discussed labs with patient today.  Decrease to 400-500 mcg daily.  Recheck level in 3 months.    2. Vitamin D deficiency Not at goal. Current vitamin D is 19.3, tested on 06/18/2020. Optimal goal > 50 ng/dL.   Plan: New.  Discussed labs with patient today.  - Discussed importance of vitamin D to their health and well-being.  - possible symptoms of low Vitamin D can be low energy, depressed mood, muscle aches, joint aches, osteoporosis etc. - low Vitamin D levels may be linked to an increased risk of cardiovascular events and even increased risk of cancers- such as colon and breast.  - I recommend pt take weekly prescription vit D - see script below   - Informed patient this may be a lifelong thing, and she was encouraged to continue to take the medicine until told otherwise.   - we will need to monitor levels regularly (every 3-4 mo on average) to keep levels within normal limits.  - weight loss will likely improve availability of vitamin D, thus encouraged Samantha Raymond to continue with meal plan and their weight loss efforts to further improve this condition - pt's questions and concerns regarding this condition addressed.  - Start Vitamin D, Ergocalciferol, (DRISDOL) 1.25 MG (50000 UNIT)  CAPS capsule; Take 1 capsule mouth every 7 days.  Dispense: 4 capsule; Refill: 0  3. Prediabetes Not at goal. Goal is HgbA1c < 5.7.  Medication: Ozempic 0.25 mg subcutaneously weekly.  A1c 6.0 around 1 month ago.  Elevated FI.  Plan:  Discussed labs with patient today.  On Ozempic.  She will increase dose to 0.5 mg subcutaneously weekly.  She will continue to focus on protein-rich, low simple carbohydrate foods. We reviewed the importance of hydration, regular exercise for stress reduction,  and restorative sleep.   Lab Results  Component Value Date   HGBA1C 6.0 (H) 05/08/2020   Lab Results  Component Value Date   INSULIN 9.8 06/18/2020   4. Obesity, current BMI 37.7  Course: Samantha Raymond is currently in the action stage of change. As such, her goal is to continue with weight loss efforts.   Nutrition goals: She has agreed to the Category 2 Plan with Protein Equivalents  Exercise goals: As is.  Behavioral modification strategies: meal planning and cooking strategies, better snacking choices, emotional eating strategies and planning for success.  Samantha Raymond has agreed to follow-up with our clinic in 2 weeks. She was informed of the importance of frequent follow-up visits to maximize her success with intensive lifestyle modifications for her multiple health conditions.   Objective:   Blood pressure 128/73, pulse 60, temperature 98.1 F (36.7 C), height 5\' 3"  (1.6 m), weight 213 lb (96.6 kg), SpO2 98 %. Body mass index is 37.73 kg/m.  General: Cooperative, alert, well developed, in no acute distress. HEENT: Conjunctivae and lids unremarkable. Cardiovascular: Regular rhythm.  Lungs: Normal work of breathing. Neurologic: No focal deficits.   Lab Results  Component Value Date   CREATININE 0.83 05/08/2020   BUN 13 05/08/2020   NA 141 05/08/2020   K 5.2 05/08/2020   CL 104 05/08/2020   CO2 23 05/08/2020   Lab Results  Component Value Date   ALT 33 (H) 05/08/2020   AST 32 05/08/2020   ALKPHOS 88 05/08/2020   BILITOT 0.4 05/08/2020   Lab Results  Component Value Date   HGBA1C 6.0 (H) 05/08/2020   HGBA1C 5.9 (H) 11/08/2019   HGBA1C 5.8 (H) 11/03/2018   HGBA1C 5.8 (H) 10/28/2017   HGBA1C 5.8 (H) 03/19/2017   Lab Results  Component Value Date   INSULIN 9.8 06/18/2020   Lab Results  Component Value Date   TSH 0.537 05/08/2020   Lab Results  Component Value Date   CHOL 190 05/08/2020   HDL 63 05/08/2020   LDLCALC 109 (H) 05/08/2020   TRIG 98 05/08/2020    CHOLHDL 3.0 05/08/2020   Lab Results  Component Value Date   WBC 4.3 06/18/2020   HGB 14.4 06/18/2020   HCT 43.8 06/18/2020   MCV 90 06/18/2020   PLT 236 06/18/2020   Lab Results  Component Value Date   IRON 113 11/03/2013   TIBC 350 11/03/2013   FERRITIN 128 08/31/2014   Attestation Statements:   Reviewed by clinician on day of visit: allergies, medications, problem list, medical history, surgical history, family history, social history, and previous encounter notes.  Time spent on visit including pre-visit chart review and post-visit care and charting was approximately 60 minutes.   I, Water quality scientist, CMA, am acting as Location manager for [employer], DO.  I have reviewed the above documentation for accuracy and completeness, and I agree with the above. Marjory Sneddon, D.O.  The [place] was signed into law in  2016 which includes the topic of electronic health records.  This provides immediate access to information in MyChart.  This includes consultation notes, operative notes, office notes, lab results and pathology reports.  If you have any questions about what you read please let us know at your next visit so we can discuss your concerns and take corrective action if need be.  We are right here with you.

## 2020-07-16 NOTE — Progress Notes (Signed)
Office: [phone]  /  Fax: [phone]    Date: July 30, 2020   Appointment Start Time: 11:33am Duration: 27 minutes Provider: Glennie Isle, Psy.D. Type of Session: Individual Therapy  Location of Patient: Work Biomedical scientist of Provider: Provider's home (private office) Type of Contact: Telepsychological Visit via MyChart Video Visit  Session Content: Samantha Raymond is a 60 y.o. female presenting for a follow-up appointment to address the previously established treatment goal of increasing coping skills. Today's appointment was a telepsychological visit due to COVID-19. Samantha Raymond provided verbal consent for today's telepsychological appointment and she is aware she is responsible for securing confidentiality on her end of the session. Prior to proceeding with today's appointment, Samantha Raymond's physical location at the time of this appointment was obtained as well a phone number she could be reached at in the event of technical difficulties. Samantha Raymond and this provider participated in today's telepsychological service.   This provider conducted a brief check-in. Samantha Raymond discussed engaging in pleasurable activities (e.g., read a magazine, rented a movie, went for a Designer, multimedia, sang at a game, bought flowers), which she described improved mood. Positive reinforcement was provided. She also discussed a reduction in emotional eating behaviors, but acknowledged she has "not stayed exactly on plan." She added, "I made the best choices I could." Notably, Samantha Raymond reported she has been "struggling" in the past couple days due to stressors, including the [facility] being out at work. Psychoeducation provided regarding grounding techniques was provided and she was engaged in an exercise involving her senses (5-4-3-2-1) to assist with coping. She noted she was unable to access the previously sent e-mail; therefore, she was receptive to this provider sharing the handout from the last appointment as well as the handout for today's exercise via a  MyChart message. Prior to sending the message, this provider explained the message would be visible to all providers, as it would be part of the electronic medical record. [name] verbally acknowledged understanding, and verbally consented to this provider sending the MyChart message Overall, Samantha Raymond was receptive to today's appointment as evidenced by openness to sharing, responsiveness to feedback, and willingness to implement discussed strategies .  Mental Status Examination:  Appearance: well groomed and appropriate hygiene  Behavior: appropriate to circumstances Mood: euthymic Affect: mood congruent Speech: normal in rate, volume, and tone Eye Contact: appropriate Psychomotor Activity: unable to assess  Gait: unable to assess Thought Process: linear, logical, and goal directed  Thought Content/Perception: no hallucinations, delusions, bizarre thinking or behavior reported or observed and no evidence or endorsement of suicidal and homicidal ideation, plan, and intent Orientation: time, person, place, and purpose of appointment Memory/Concentration: memory, attention, language, and fund of knowledge intact  Insight/Judgment: good  Interventions:  Conducted a brief chart review Provided empathic reflections and validation Reviewed content from the previous session Provided positive reinforcement Employed supportive psychotherapy interventions to facilitate reduced distress and to improve coping skills with identified stressors Psychoeducation provided regarding grounding techniques  Engaged patient in a grounding technique   DSM-5 Diagnosis(es):  F50.89 Other Specified Feeding or Eating Disorder, Emotional Eating Behaviors and F32.A Unspecified Depressive Disorder   Treatment Goal & Progress: During the initial appointment with this provider, the following treatment goal was established: increase coping skills. Samantha Raymond has demonstrated progress in her goal as evidenced by increased awareness  of hunger patterns. Samantha Raymond also continues to demonstrate willingness to engage in learned skill(s).  Plan: The next appointment will be scheduled in two weeks, which will be via MyChart Video Visit. The next session will  focus on working towards the established treatment goal.

## 2020-07-17 ENCOUNTER — Encounter (INDEPENDENT_AMBULATORY_CARE_PROVIDER_SITE_OTHER): Payer: Self-pay | Admitting: Family Medicine

## 2020-07-17 ENCOUNTER — Ambulatory Visit (INDEPENDENT_AMBULATORY_CARE_PROVIDER_SITE_OTHER): Payer: No Typology Code available for payment source | Admitting: Family Medicine

## 2020-07-17 ENCOUNTER — Other Ambulatory Visit (HOSPITAL_COMMUNITY): Payer: Self-pay

## 2020-07-17 ENCOUNTER — Other Ambulatory Visit: Payer: Self-pay

## 2020-07-17 VITALS — BP 132/82 | HR 68 | Temp 98.9°F | Ht 63.0 in | Wt 207.0 lb

## 2020-07-17 DIAGNOSIS — Z6837 Body mass index (BMI) 37.0-37.9, adult: Secondary | ICD-10-CM

## 2020-07-17 DIAGNOSIS — E559 Vitamin D deficiency, unspecified: Secondary | ICD-10-CM

## 2020-07-17 DIAGNOSIS — Z9189 Other specified personal risk factors, not elsewhere classified: Secondary | ICD-10-CM | POA: Diagnosis not present

## 2020-07-17 MED ORDER — VITAMIN D (ERGOCALCIFEROL) 1.25 MG (50000 UNIT) PO CAPS
50000.0000 [IU] | ORAL_CAPSULE | ORAL | 0 refills | Status: DC
Start: 2020-07-17 — End: 2020-08-14
  Filled 2020-07-17: qty 4, 28d supply, fill #0

## 2020-07-29 NOTE — Progress Notes (Signed)
Chief Complaint:   OBESITY Samantha Raymond is here to discuss her progress with her obesity treatment plan along with follow-up of her obesity related diagnoses.   Today's visit was #: 3 Starting weight: 213 lbs Starting date: 06/18/2020 Today's weight: 207 lbs Today's date: 07/17/2020 Weight change since last visit: 6 lbs Total lbs lost to date: 6 lbs Body mass index is 36.67 kg/m.  Total weight loss percentage to date: -10.39%  Interim History: Samantha Raymond spent 5 days in the hospital with her grandson.  Occasionally skipped meals but really planned much better and prepped meals.  Happy with her weight loss despite having so much stress in life.  Current Meal Plan: the Category 2 Plan for 70-80% of the time.  Current Exercise Plan: Walking for 20 minutes 2-3 times per week. Current Anti-Obesity Medications: Samantha Raymond 0.25 mg subcutaneously weekly. Side effects: None.  Assessment/Plan:   Medications Discontinued During This Encounter  Medication Reason   Cyanocobalamin (VITAMIN B-12) 1000 MCG SUBL    Vitamin D, Ergocalciferol, (DRISDOL) 1.25 MG (50000 UNIT) CAPS capsule Reorder   Meds ordered this encounter  Medications   Vitamin D, Ergocalciferol, (DRISDOL) 1.25 MG (50000 UNIT) CAPS capsule    Sig: Take 1 capsule mouth every 7 days.    Dispense:  4 capsule    Refill:  0   1. Vitamin D deficiency Not at goal. Current vitamin D is 19.3, tested on 06/18/2020. Optimal goal > 50 ng/dL.  She is taking vitamin D 50,000 IU weekly.  Plan: Continue to take prescription Vitamin D @50 ,000 IU every week as prescribed.  Follow-up for routine testing of Vitamin D, at least 2-3 times per year to avoid over-replacement.  - Refill Vitamin D, Ergocalciferol, (DRISDOL) 1.25 MG (50000 UNIT) CAPS capsule; Take 1 capsule mouth every 7 days.  Dispense: 4 capsule; Refill: 0  2. At risk for malnutrition Samantha Raymond was given extensive malnutrition prevention education and counseling today of more than 9 minutes.   Counseled her that malnutrition refers to inappropriate nutrients or not the right balance of nutrients for optimal health.  Discussed with Samantha Raymond that it is absolutely possible to be malnourished but yet obese.  Risk factors, including but not limited to, inappropriate dietary choices, difficulty with obtaining food due to physical or financial limitations, and various physical and mental health conditions were reviewed with Samantha Raymond.    3. Obesity, current BMI 36.8  Course: Samantha Raymond is currently in the action stage of change. As such, her goal is to continue with weight loss efforts.   Nutrition goals: She has agreed to the Category 2 Plan.   Exercise goals:  Start class on Samantha Raymond for 15 minutes 2-3 days per week in addition to golf.  Behavioral modification strategies: increasing lean protein intake, increasing water intake, no skipping meals, and meal planning and cooking strategies.  Samantha Raymond has agreed to follow-up with our clinic in 2-3 weeks. She was informed of the importance of frequent follow-up visits to maximize her success with intensive lifestyle modifications for her multiple health conditions.   Objective:   Blood pressure 132/82, pulse 68, temperature 98.9 F (37.2 C), height 5\' 3"  (1.6 m), weight 207 lb (93.9 kg), SpO2 97 %. Body mass index is 36.67 kg/m.  General: Cooperative, alert, well developed, in no acute distress. HEENT: Conjunctivae and lids unremarkable. Cardiovascular: Regular rhythm.  Lungs: Normal work of breathing. Neurologic: No focal deficits.   Lab Results  Component Value Date   CREATININE 0.83  05/08/2020   BUN 13 05/08/2020   NA 141 05/08/2020   K 5.2 05/08/2020   CL 104 05/08/2020   CO2 23 05/08/2020   Lab Results  Component Value Date   ALT 33 (H) 05/08/2020   AST 32 05/08/2020   ALKPHOS 88 05/08/2020   BILITOT 0.4 05/08/2020   Lab Results  Component Value Date   HGBA1C 6.0 (H) 05/08/2020   HGBA1C 5.9  (H) 11/08/2019   HGBA1C 5.8 (H) 11/03/2018   HGBA1C 5.8 (H) 10/28/2017   HGBA1C 5.8 (H) 03/19/2017   Lab Results  Component Value Date   INSULIN 9.8 06/18/2020   Lab Results  Component Value Date   TSH 0.537 05/08/2020   Lab Results  Component Value Date   CHOL 190 05/08/2020   HDL 63 05/08/2020   LDLCALC 109 (H) 05/08/2020   TRIG 98 05/08/2020   CHOLHDL 3.0 05/08/2020   Lab Results  Component Value Date   WBC 4.3 06/18/2020   HGB 14.4 06/18/2020   HCT 43.8 06/18/2020   MCV 90 06/18/2020   PLT 236 06/18/2020   Lab Results  Component Value Date   IRON 113 11/03/2013   TIBC 350 11/03/2013   FERRITIN 128 08/31/2014   Attestation Statements:   Reviewed by clinician on day of visit: allergies, medications, problem list, medical history, surgical history, family history, social history, and previous encounter notes.  I, Water quality scientist, CMA, am acting as Location manager for [employer], DO.  I have reviewed the above documentation for accuracy and completeness, and I agree with the above. Samantha Raymond, D.O.  The [place] was signed into law in 2016 which includes the topic of electronic health records.  This provides immediate access to information in MyChart.  This includes consultation notes, operative notes, office notes, lab results and pathology reports.  If you have any questions about what you read please let us know at your next visit so we can discuss your concerns and take corrective action if need be.  We are right here with you.

## 2020-07-30 ENCOUNTER — Encounter (INDEPENDENT_AMBULATORY_CARE_PROVIDER_SITE_OTHER): Payer: Self-pay

## 2020-07-30 ENCOUNTER — Telehealth (INDEPENDENT_AMBULATORY_CARE_PROVIDER_SITE_OTHER): Payer: No Typology Code available for payment source | Admitting: Psychology

## 2020-07-30 DIAGNOSIS — F5089 Other specified eating disorder: Secondary | ICD-10-CM

## 2020-07-30 DIAGNOSIS — F32A Depression, unspecified: Secondary | ICD-10-CM

## 2020-07-31 ENCOUNTER — Ambulatory Visit (INDEPENDENT_AMBULATORY_CARE_PROVIDER_SITE_OTHER): Payer: No Typology Code available for payment source | Admitting: Family Medicine

## 2020-08-09 ENCOUNTER — Other Ambulatory Visit (HOSPITAL_COMMUNITY): Payer: Self-pay

## 2020-08-09 ENCOUNTER — Other Ambulatory Visit: Payer: Self-pay | Admitting: Internal Medicine

## 2020-08-09 MED ORDER — OZEMPIC (0.25 OR 0.5 MG/DOSE) 2 MG/1.5ML ~~LOC~~ SOPN
0.2500 mg | PEN_INJECTOR | SUBCUTANEOUS | 0 refills | Status: DC
  Filled 2020-08-09: qty 1.5, 56d supply, fill #0

## 2020-08-13 ENCOUNTER — Encounter (INDEPENDENT_AMBULATORY_CARE_PROVIDER_SITE_OTHER): Payer: Self-pay

## 2020-08-13 ENCOUNTER — Telehealth (INDEPENDENT_AMBULATORY_CARE_PROVIDER_SITE_OTHER): Payer: Self-pay | Admitting: Psychology

## 2020-08-13 ENCOUNTER — Telehealth (INDEPENDENT_AMBULATORY_CARE_PROVIDER_SITE_OTHER): Payer: No Typology Code available for payment source | Admitting: Psychology

## 2020-08-13 NOTE — Telephone Encounter (Signed)
  Office: [phone]  /  Fax: [phone]  Date of Call: August 13, 2020  Time of Call: 12:34pm Provider: Glennie Isle, PsyD  CONTENT: This provider called Samantha Raymond to check-in as she did not present for today's MyChart Video Visit appointment at 12:30pm. A HIPAA compliant voicemail was left requesting a call back. Of note, this provider stayed on the MyChart Video Visit appointment for 5 minutes prior to signing off per the clinic's grace period policy.    PLAN: This provider will wait for Samantha Raymond to call back. No further follow-up planned by this provider.

## 2020-08-13 NOTE — Progress Notes (Incomplete)
  Office: [phone]  /  Fax: [phone]    Date: August 13, 2020    Appointment Start Time: *** Duration: *** minutes Provider: Glennie Isle, Psy.D. Type of Session: Individual Therapy  Location of Patient: {gbptloc:23249} Location of Provider: Provider's home (private office) Type of Contact: Telepsychological Visit via MyChart Video Visit  Session Content:This provider called Samantha Raymond at 12:04pm as she did not present for today's appointment. A HIPAA compliant voicemail was left requesting a call back. As such, today's appointment was initiated *** minutes late.  Samantha Raymond is a 60 y.o. female presenting for a follow-up appointment to address the previously established treatment goal of increasing coping skills. Today's appointment was a telepsychological visit due to COVID-19. Samantha Raymond provided verbal consent for today's telepsychological appointment and she is aware she is responsible for securing confidentiality on her end of the session. Prior to proceeding with today's appointment, Samantha Raymond's physical location at the time of this appointment was obtained as well a phone number she could be reached at in the event of technical difficulties. Samantha Raymond and this provider participated in today's telepsychological service.   This provider conducted a brief check-in. *** Reviewed grounding technique. *** Psychoeducation regarding triggers for emotional eating was provided. [name] was provided a handout, and encouraged to utilize the handout between now and the next appointment to increase awareness of triggers and frequency. Samantha Raymond agreed. This provider also discussed behavioral strategies for specific triggers, such as placing the utensil down when conversing to avoid mindless eating.  Samantha Raymond provided verbal consent during today's appointment for this provider to send a handout about triggers via e-mail.   Samantha Raymond was receptive to today's appointment as evidenced by openness to sharing, responsiveness to feedback,  and {gbreceptiveness:23401}.  Mental Status Examination:  Appearance: well groomed and appropriate hygiene  Behavior: appropriate to circumstances Mood: euthymic Affect: mood congruent Speech: normal in rate, volume, and tone Eye Contact: appropriate Psychomotor Activity: unable to assess Gait: unable to assess Thought Process: linear, logical, and goal directed  Thought Content/Perception: no hallucinations, delusions, bizarre thinking or behavior reported or observed and no evidence or endorsement of suicidal and homicidal ideation, plan, and intent Orientation: time, person, place, and purpose of appointment Memory/Concentration: memory, attention, language, and fund of knowledge intact  Insight/Judgment: {Insight:22446}  Interventions:  {Interventions:22172}  DSM-5 Diagnosis(es):  F50.89 Other Specified Feeding or Eating Disorder, Emotional Eating Behaviors and F32.A Unspecified Depressive Disorder   Treatment Goal & Progress: During the initial appointment with this provider, the following treatment goal was established: increase coping skills. Samantha Raymond has demonstrated progress in her goal as evidenced by increased awareness of hunger patterns. Samantha Raymond also continues to demonstrate willingness to engage in learned skill(s).  Plan: The next appointment will be scheduled in {gbweeks:21758}, which will be via MyChart Video Visit. The next session will focus on working towards the established treatment goal.

## 2020-08-14 ENCOUNTER — Encounter (INDEPENDENT_AMBULATORY_CARE_PROVIDER_SITE_OTHER): Payer: Self-pay | Admitting: Family Medicine

## 2020-08-14 ENCOUNTER — Other Ambulatory Visit: Payer: Self-pay

## 2020-08-14 ENCOUNTER — Ambulatory Visit (INDEPENDENT_AMBULATORY_CARE_PROVIDER_SITE_OTHER): Payer: No Typology Code available for payment source | Admitting: Family Medicine

## 2020-08-14 ENCOUNTER — Other Ambulatory Visit (HOSPITAL_COMMUNITY): Payer: Self-pay

## 2020-08-14 VITALS — BP 111/74 | HR 70 | Temp 98.3°F | Ht 63.0 in | Wt 205.0 lb

## 2020-08-14 DIAGNOSIS — F32A Depression, unspecified: Secondary | ICD-10-CM

## 2020-08-14 DIAGNOSIS — Z6837 Body mass index (BMI) 37.0-37.9, adult: Secondary | ICD-10-CM

## 2020-08-14 DIAGNOSIS — E559 Vitamin D deficiency, unspecified: Secondary | ICD-10-CM | POA: Diagnosis not present

## 2020-08-14 DIAGNOSIS — Z9189 Other specified personal risk factors, not elsewhere classified: Secondary | ICD-10-CM

## 2020-08-14 MED ORDER — VITAMIN D (ERGOCALCIFEROL) 1.25 MG (50000 UNIT) PO CAPS
50000.0000 [IU] | ORAL_CAPSULE | ORAL | 0 refills | Status: DC
  Filled 2020-08-14: qty 4, 28d supply, fill #0

## 2020-08-14 MED ORDER — BUPROPION HCL ER (SR) 100 MG PO TB12
ORAL_TABLET | ORAL | 0 refills | Status: DC
  Filled 2020-08-14: qty 30, 30d supply, fill #0

## 2020-08-15 NOTE — Telephone Encounter (Signed)
Dr.Opalski ?

## 2020-08-15 NOTE — Telephone Encounter (Signed)
This pt was seen yesterday. Please review. Samantha Raymond, [name]

## 2020-08-26 ENCOUNTER — Encounter (INDEPENDENT_AMBULATORY_CARE_PROVIDER_SITE_OTHER): Payer: Self-pay | Admitting: Family Medicine

## 2020-08-26 NOTE — Progress Notes (Signed)
Chief Complaint:   OBESITY Samantha Raymond is here to discuss her progress with her obesity treatment plan along with follow-up of her obesity related diagnoses.   Today's visit was #: 4 Starting weight: 213 lbs Starting date: 06/18/2020 Today's weight: 205 lbs Today's date: 08/14/2020 Weight change since last visit: 2 lbs Total lbs lost to date: 8 lbs Body mass index is 36.31 kg/m.  Total weight loss percentage to date: -3.76%  Interim History:  Kayonna's 61 year old nephew died this weekend by unknown reasons.  Her grandson is going through cancer treatment and she is very worried about him.  She is tearful today, and from time to time, she cries.  Current Meal Plan: the Category 2 Plan for 50% of the time.  Current Exercise Plan: None. Current Anti-Obesity Medications: Ozempic 0.25 mg subcutaneously weekly. Side effects: None.  Assessment/Plan:   Medications Discontinued During This Encounter  Medication Reason   Vitamin D, Ergocalciferol, (DRISDOL) 1.25 MG (50000 UNIT) CAPS capsule Reorder   Meds ordered this encounter  Medications   Vitamin D, Ergocalciferol, (DRISDOL) 1.25 MG (50000 UNIT) CAPS capsule    Sig: Take 1 capsule by mouth every 7 days.    Dispense:  4 capsule    Refill:  0   buPROPion (WELLBUTRIN SR) 100 MG 12 hr tablet    Sig: Take 1 tablet by mouth daily    Dispense:  30 tablet    Refill:  0    1. Vitamin D deficiency Not at goal.  She is taking vitamin D 50,000 IU weekly.  Plan: Continue to take prescription Vitamin D @50 ,000 IU every week as prescribed.  Follow-up for routine testing of Vitamin D, at least 2-3 times per year to avoid over-replacement.  Lab Results  Component Value Date   VD25OH 19.3 (L) 06/18/2020   VD25OH 29.45 (L) 10/08/2014   - Refill Vitamin D, Ergocalciferol, (DRISDOL) 1.25 MG (50000 UNIT) CAPS capsule; Take 1 capsule by mouth every 7 days.  Dispense: 4 capsule; Refill: 0    2. Depression, unspecified - Emotional eating Not at  goal. Medication: Wellbutrin 100 mg daily.  She has been struggling lately and is tearful today.  She is thinking she needs medications to help take the edge off.  No SI.  "Doing anything is a struggle lately."  Plan:  Start Wellbutrin 100 mg daily.  Counseling done. Behavior modification techniques were discussed today to help deal with emotional/non-hunger eating behaviors.  - Start buPROPion (WELLBUTRIN SR) 100 MG 12 hr tablet; Take 1 tablet by mouth daily  Dispense: 30 tablet; Refill: 0    3. At risk for anxiety Jasdeep Dejarnett was given approximately 23 minutes of anxiety risk counseling today. The patient has risk factors for this condition.  We discussed the importance of a healthy work/life balance, a healthy relationship with food, and a good support system.  We discussed various strategies to help cope with these emotions as well.  I recommended counseling, meditation / prayer, healthy eating habits, sleep hygiene, and exercising to help manage these feelings.     4. Class 2 severe obesity with serious comorbidity and body mass index (BMI) of 37.0 to 37.9 in adult, unspecified obesity type [facility])  Course: Genell is currently in the action stage of change. As such, her goal is to continue with weight loss efforts.   Nutrition goals: She has agreed to the Category 2 Plan.   Exercise goals: All adults should avoid inactivity. Some physical activity is better  than none, and adults who participate in any amount of physical activity gain some health benefits.  Start some kind of exercise for stress management.  Behavioral modification strategies: emotional eating strategies and planning for success.  Kennice has agreed to follow-up with our clinic in 2-3 weeks. She was informed of the importance of frequent follow-up visits to maximize her success with intensive lifestyle modifications for her multiple health conditions.   Objective:   Blood pressure 111/74, pulse 70, temperature  98.3 F (36.8 C), height 5\' 3"  (1.6 m), weight 205 lb (93 kg), SpO2 98 %. Body mass index is 36.31 kg/m.  General: Cooperative, alert, well developed, in no acute distress. HEENT: Conjunctivae and lids unremarkable. Cardiovascular: Regular rhythm.  Lungs: Normal work of breathing. Neurologic: No focal deficits.   Lab Results  Component Value Date   CREATININE 0.83 05/08/2020   BUN 13 05/08/2020   NA 141 05/08/2020   K 5.2 05/08/2020   CL 104 05/08/2020   CO2 23 05/08/2020   Lab Results  Component Value Date   ALT 33 (H) 05/08/2020   AST 32 05/08/2020   ALKPHOS 88 05/08/2020   BILITOT 0.4 05/08/2020   Lab Results  Component Value Date   HGBA1C 6.0 (H) 05/08/2020   HGBA1C 5.9 (H) 11/08/2019   HGBA1C 5.8 (H) 11/03/2018   HGBA1C 5.8 (H) 10/28/2017   HGBA1C 5.8 (H) 03/19/2017   Lab Results  Component Value Date   INSULIN 9.8 06/18/2020   Lab Results  Component Value Date   TSH 0.537 05/08/2020   Lab Results  Component Value Date   CHOL 190 05/08/2020   HDL 63 05/08/2020   LDLCALC 109 (H) 05/08/2020   TRIG 98 05/08/2020   CHOLHDL 3.0 05/08/2020   Lab Results  Component Value Date   VD25OH 19.3 (L) 06/18/2020   VD25OH 29.45 (L) 10/08/2014   Lab Results  Component Value Date   WBC 4.3 06/18/2020   HGB 14.4 06/18/2020   HCT 43.8 06/18/2020   MCV 90 06/18/2020   PLT 236 06/18/2020   Lab Results  Component Value Date   IRON 113 11/03/2013   TIBC 350 11/03/2013   FERRITIN 128 08/31/2014   Attestation Statements:   Reviewed by clinician on day of visit: allergies, medications, problem list, medical history, surgical history, family history, social history, and previous encounter notes.  I, Water quality scientist, CMA, am acting as Location manager for [employer], DO.  I have reviewed the above documentation for accuracy and completeness, and I agree with the above. Marjory Sneddon, D.O.  The [place] was signed into law in 2016 which  includes the topic of electronic health records.  This provides immediate access to information in MyChart.  This includes consultation notes, operative notes, office notes, lab results and pathology reports.  If you have any questions about what you read please let us know at your next visit so we can discuss your concerns and take corrective action if need be.  We are right here with you.

## 2020-09-03 ENCOUNTER — Other Ambulatory Visit (HOSPITAL_COMMUNITY): Payer: Self-pay

## 2020-09-03 ENCOUNTER — Other Ambulatory Visit (INDEPENDENT_AMBULATORY_CARE_PROVIDER_SITE_OTHER): Payer: Self-pay | Admitting: Family Medicine

## 2020-09-03 ENCOUNTER — Telehealth (INDEPENDENT_AMBULATORY_CARE_PROVIDER_SITE_OTHER): Payer: Self-pay

## 2020-09-03 DIAGNOSIS — F32A Depression, unspecified: Secondary | ICD-10-CM

## 2020-09-03 NOTE — Telephone Encounter (Signed)
Pt called in and stated that she left her Wellbutrin it when she was out of town at her in-laws. She has requested a refill and wanted to let the provider know why she is refilling it sooner then normal. Please advise

## 2020-09-03 NOTE — Telephone Encounter (Signed)
Last OV with Dr Opalski 

## 2020-09-04 ENCOUNTER — Encounter (INDEPENDENT_AMBULATORY_CARE_PROVIDER_SITE_OTHER): Payer: Self-pay | Admitting: Family Medicine

## 2020-09-04 ENCOUNTER — Other Ambulatory Visit (HOSPITAL_COMMUNITY): Payer: Self-pay

## 2020-09-04 ENCOUNTER — Ambulatory Visit (INDEPENDENT_AMBULATORY_CARE_PROVIDER_SITE_OTHER): Payer: No Typology Code available for payment source | Admitting: Family Medicine

## 2020-09-04 ENCOUNTER — Other Ambulatory Visit: Payer: Self-pay

## 2020-09-04 VITALS — BP 114/74 | HR 69 | Temp 98.9°F | Ht 63.0 in | Wt 205.0 lb

## 2020-09-04 DIAGNOSIS — Z9189 Other specified personal risk factors, not elsewhere classified: Secondary | ICD-10-CM

## 2020-09-04 DIAGNOSIS — Z6837 Body mass index (BMI) 37.0-37.9, adult: Secondary | ICD-10-CM | POA: Diagnosis not present

## 2020-09-04 DIAGNOSIS — F32A Depression, unspecified: Secondary | ICD-10-CM | POA: Diagnosis not present

## 2020-09-04 DIAGNOSIS — E559 Vitamin D deficiency, unspecified: Secondary | ICD-10-CM | POA: Diagnosis not present

## 2020-09-04 MED ORDER — BUPROPION HCL ER (SR) 100 MG PO TB12
ORAL_TABLET | ORAL | 0 refills | Status: DC
Start: 2020-09-04 — End: 2020-09-25
  Filled 2020-09-04: qty 60, 30d supply, fill #0

## 2020-09-04 MED ORDER — VITAMIN D (ERGOCALCIFEROL) 1.25 MG (50000 UNIT) PO CAPS
50000.0000 [IU] | ORAL_CAPSULE | ORAL | 0 refills | Status: DC
  Filled 2020-09-04: qty 4, 28d supply, fill #0

## 2020-09-04 NOTE — Telephone Encounter (Signed)
Pt will obtain refill on 7/20

## 2020-09-05 ENCOUNTER — Other Ambulatory Visit (HOSPITAL_COMMUNITY): Payer: Self-pay

## 2020-09-07 ENCOUNTER — Other Ambulatory Visit (HOSPITAL_COMMUNITY): Payer: Self-pay

## 2020-09-12 NOTE — Progress Notes (Signed)
Chief Complaint:   OBESITY Samantha Raymond is here to discuss her progress with her obesity treatment plan along with follow-up of her obesity related diagnoses.   Today's visit was #: 5 Starting weight: 213 lbs Starting date: 06/18/2020 Today's weight: 205 lbs Today's date: 09/04/2020 Weight change since last visit: 0 Total lbs lost to date: 8 lbs Body mass index is 36.31 kg/m.  Total weight loss percentage to date: -3.76%  Interim History:  Samantha Raymond says she is doing less emotional eating lately.  Less interest in snacks.  Doing well.  No issues with meal plan.  Current Meal Plan: the Category 2 Plan for 20-30% of the time.  Current Exercise Plan: None. Current Anti-Obesity Medications: Ozempic 0.25 mg subcutaneously weekly. Side effects: None.  Assessment/Plan:   Medications Discontinued During This Encounter  Medication Reason   Vitamin D, Ergocalciferol, (DRISDOL) 1.25 MG (50000 UNIT) CAPS capsule Reorder   buPROPion (WELLBUTRIN SR) 100 MG 12 hr tablet Reorder   Meds ordered this encounter  Medications   Vitamin D, Ergocalciferol, (DRISDOL) 1.25 MG (50000 UNIT) CAPS capsule    Sig: Take 1 capsule by mouth every 7 days.    Dispense:  4 capsule    Refill:  0   buPROPion ER (WELLBUTRIN SR) 100 MG 12 hr tablet    Sig: Take 1 tablet by mouth twice daily    Dispense:  60 tablet    Refill:  0    1. Vitamin D deficiency Not at goal.  She is taking vitamin D 50,000 IU weekly.  Plan: Continue to take prescription Vitamin D '@50'$ ,000 IU every week as prescribed.  Follow-up for routine testing of Vitamin D, at least 2-3 times per year to avoid over-replacement.  Lab Results  Component Value Date   VD25OH 19.3 (L) 06/18/2020   VD25OH 29.45 (L) 10/08/2014   - Refill Vitamin D, Ergocalciferol, (DRISDOL) 1.25 MG (50000 UNIT) CAPS capsule; Take 1 capsule by mouth every 7 days.  Dispense: 4 capsule; Refill: 0  2. Depression, unspecified - Emotional eating Improving, but not optimized.  Medication: Wellbutrin 100 mg twice daily.  Started Wellbutrin last office visit.  Doing better.  Tolerating well.  Was with grandson all week.  Had complications.  She realizes she needs to focus on self care.  Plan:  Refill Wellbutrin.  Will write for twice daily at same dose and she will increase at her discretion. Discussed cues and consequences, how thoughts affect eating, model of thoughts, feelings, and behaviors, and strategies for change by focusing on the cue. Discussed cognitive distortions, coping thoughts, and how to change your thoughts.  - Refill buPROPion ER (WELLBUTRIN SR) 100 MG 12 hr tablet; Take 1 tablet by mouth twice daily  Dispense: 60 tablet; Refill: 0  3. At risk for side effect of medication Due to Samantha Raymond's current conditions and medications, she is at a higher risk for drug side effect.  At least 8 minutes was spent on counseling her about these concerns today.  We discussed the benefits and potential risks of these medications, and all of patient's concerns were addressed and questions were answered.  she will call us, or their PCP or other specialists who treat their conditions with medications, with any questions or concerns that may develop.     4. Class 2 severe obesity with serious comorbidity and body mass index (BMI) of 37.0 to 37.9 in adult, unspecified obesity type [facility])  Course: Samantha Raymond is currently in the action stage of change. As  such, her goal is to continue with weight loss efforts.   Nutrition goals: She has agreed to the Category 2 Plan.   Exercise goals:  As is.  Behavioral modification strategies: planning for success.  Samantha Raymond has agreed to follow-up with our clinic in 3 weeks. She was informed of the importance of frequent follow-up visits to maximize her success with intensive lifestyle modifications for her multiple health conditions.   Objective:   Blood pressure 114/74, pulse 69, temperature 98.9 F (37.2 C), height '5\' 3"'$  (1.6 m), weight 205 lb  (93 kg), SpO2 98 %. Body mass index is 36.31 kg/m.  General: Cooperative, alert, well developed, in no acute distress. HEENT: Conjunctivae and lids unremarkable. Cardiovascular: Regular rhythm.  Lungs: Normal work of breathing. Neurologic: No focal deficits.   Lab Results  Component Value Date   CREATININE 0.83 05/08/2020   BUN 13 05/08/2020   NA 141 05/08/2020   K 5.2 05/08/2020   CL 104 05/08/2020   CO2 23 05/08/2020   Lab Results  Component Value Date   ALT 33 (H) 05/08/2020   AST 32 05/08/2020   ALKPHOS 88 05/08/2020   BILITOT 0.4 05/08/2020   Lab Results  Component Value Date   HGBA1C 6.0 (H) 05/08/2020   HGBA1C 5.9 (H) 11/08/2019   HGBA1C 5.8 (H) 11/03/2018   HGBA1C 5.8 (H) 10/28/2017   HGBA1C 5.8 (H) 03/19/2017   Lab Results  Component Value Date   INSULIN 9.8 06/18/2020   Lab Results  Component Value Date   TSH 0.537 05/08/2020   Lab Results  Component Value Date   CHOL 190 05/08/2020   HDL 63 05/08/2020   LDLCALC 109 (H) 05/08/2020   TRIG 98 05/08/2020   CHOLHDL 3.0 05/08/2020   Lab Results  Component Value Date   VD25OH 19.3 (L) 06/18/2020   VD25OH 29.45 (L) 10/08/2014   Lab Results  Component Value Date   WBC 4.3 06/18/2020   HGB 14.4 06/18/2020   HCT 43.8 06/18/2020   MCV 90 06/18/2020   PLT 236 06/18/2020   Lab Results  Component Value Date   IRON 113 11/03/2013   TIBC 350 11/03/2013   FERRITIN 128 08/31/2014   Attestation Statements:   Reviewed by clinician on day of visit: allergies, medications, problem list, medical history, surgical history, family history, social history, and previous encounter notes.  I, Water quality scientist, CMA, am acting as Location manager for [employer], DO.  I have reviewed the above documentation for accuracy and completeness, and I agree with the above. Marjory Sneddon, D.O.  The [place] was signed into law in 2016 which includes the topic of electronic health records.  This  provides immediate access to information in MyChart.  This includes consultation notes, operative notes, office notes, lab results and pathology reports.  If you have any questions about what you read please let us know at your next visit so we can discuss your concerns and take corrective action if need be.  We are right here with you.

## 2020-09-25 ENCOUNTER — Other Ambulatory Visit: Payer: Self-pay

## 2020-09-25 ENCOUNTER — Other Ambulatory Visit (HOSPITAL_COMMUNITY): Payer: Self-pay

## 2020-09-25 ENCOUNTER — Encounter (INDEPENDENT_AMBULATORY_CARE_PROVIDER_SITE_OTHER): Payer: Self-pay | Admitting: Family Medicine

## 2020-09-25 ENCOUNTER — Other Ambulatory Visit (HOSPITAL_BASED_OUTPATIENT_CLINIC_OR_DEPARTMENT_OTHER): Payer: Self-pay

## 2020-09-25 ENCOUNTER — Ambulatory Visit (INDEPENDENT_AMBULATORY_CARE_PROVIDER_SITE_OTHER): Payer: No Typology Code available for payment source | Admitting: Family Medicine

## 2020-09-25 VITALS — BP 101/65 | HR 74 | Temp 98.5°F | Ht 63.0 in | Wt 203.0 lb

## 2020-09-25 DIAGNOSIS — Z6837 Body mass index (BMI) 37.0-37.9, adult: Secondary | ICD-10-CM

## 2020-09-25 DIAGNOSIS — F32A Depression, unspecified: Secondary | ICD-10-CM

## 2020-09-25 DIAGNOSIS — E559 Vitamin D deficiency, unspecified: Secondary | ICD-10-CM | POA: Diagnosis not present

## 2020-09-25 DIAGNOSIS — Z9189 Other specified personal risk factors, not elsewhere classified: Secondary | ICD-10-CM | POA: Diagnosis not present

## 2020-09-25 DIAGNOSIS — R7303 Prediabetes: Secondary | ICD-10-CM | POA: Diagnosis not present

## 2020-09-25 MED ORDER — OZEMPIC (0.25 OR 0.5 MG/DOSE) 2 MG/1.5ML ~~LOC~~ SOPN
PEN_INJECTOR | SUBCUTANEOUS | 0 refills | Status: DC
  Filled 2020-09-25 (×2): qty 1.5, 28d supply, fill #0
  Filled 2020-09-25: qty 1.5, fill #0

## 2020-09-25 MED ORDER — VITAMIN D (ERGOCALCIFEROL) 1.25 MG (50000 UNIT) PO CAPS
50000.0000 [IU] | ORAL_CAPSULE | ORAL | 0 refills | Status: DC
  Filled 2020-09-25 – 2020-10-02 (×2): qty 4, 28d supply, fill #0

## 2020-09-25 MED ORDER — BUPROPION HCL ER (SR) 100 MG PO TB12: ORAL_TABLET | ORAL | 0 refills | Status: DC

## 2020-09-25 MED ORDER — BUPROPION HCL ER (SR) 100 MG PO TB12
ORAL_TABLET | ORAL | 0 refills | Status: DC
  Filled 2020-09-25: qty 60, fill #0

## 2020-09-30 NOTE — Progress Notes (Signed)
Chief Complaint:   OBESITY Samantha Raymond is here to discuss her progress with her obesity treatment plan along with follow-up of her obesity related diagnoses. Samantha Raymond is on the Category 2 Plan and states she is following her eating plan approximately 40% of the time. Samantha Raymond states she is walking for 15 minutes 2 times per week.  Today's visit was #: 6 Starting weight: 213 lbs Starting date: 06/18/2020 Today's weight: 203 lbs Today's date: 09/25/2020 Total lbs lost to date: 10 Total lbs lost since last in-office visit: 2  Interim History: Samantha Raymond is implementing strategies to be more mindful and help with emotional eating. She is setting boundaries in life and putting herself first more.   Subjective:   1. Pre-diabetes Samantha Raymond increased her Ozempic on her own last week to 0.5. She is tolerating it well and works well for her. She requests a refill today.  2. Vitamin D deficiency Samantha Raymond is currently taking prescription vitamin D 50,000 IU each week. She denies nausea, vomiting or muscle weakness.  3. Depression, unspecified - Emotional eating Samantha Raymond is tolerating Wellbutrin well. She is taking it in the morning only. Her mood is controlled currently.  4. At risk for side effect of medication Samantha Raymond is at risk for drug side effects due to increased dose of medications.  Assessment/Plan:   Orders Placed This Encounter  Procedures   Hemoglobin A1c   VITAMIN D 25 Hydroxy (Vit-D Deficiency, Fractures)    Medications Discontinued During This Encounter  Medication Reason   Semaglutide,0.25 or 0.'5MG'$ /DOS, (OZEMPIC, 0.25 OR 0.5 MG/DOSE,) 2 MG/1.5ML SOPN Reorder   Vitamin D, Ergocalciferol, (DRISDOL) 1.25 MG (50000 UNIT) CAPS capsule Reorder   buPROPion ER (WELLBUTRIN SR) 100 MG 12 hr tablet Reorder   buPROPion ER (WELLBUTRIN SR) 100 MG 12 hr tablet      Meds ordered this encounter  Medications   Vitamin D, Ergocalciferol, (DRISDOL) 1.25 MG (50000 UNIT) CAPS capsule    Sig: Take 1 capsule by  mouth every 7 days.    Dispense:  4 capsule    Refill:  0   Semaglutide,0.25 or 0.'5MG'$ /DOS, (OZEMPIC, 0.25 OR 0.5 MG/DOSE,) 2 MG/1.5ML SOPN    Sig: Inject 0.5 mg subcutaneously once a week    Dispense:  1.5 mL    Refill:  0   DISCONTD: buPROPion ER (WELLBUTRIN SR) 100 MG 12 hr tablet    Sig: Take 1 tablet by mouth twice daily    Dispense:  60 tablet    Refill:  0   buPROPion ER (WELLBUTRIN SR) 100 MG 12 hr tablet    Sig: Take 1 tablet by mouth twice daily    Dispense:  60 tablet    Refill:  0     1. Pre-diabetes Samantha Raymond agreed to increase Ozempic to 0.5 mg weekly and we will refill for 1 month. She will continue decreasing simple carbohydrates, increasing protein, and will continue weight loss to help decrease the risk of diabetes. We will recheck labs at he next office visit.  - Semaglutide,0.25 or 0.'5MG'$ /DOS, (OZEMPIC, 0.25 OR 0.5 MG/DOSE,) 2 MG/1.5ML SOPN; Inject 0.5 mg subcutaneously once a week  Dispense: 1.5 mL; Refill: 0  - Hemoglobin A1c  2. Vitamin D deficiency Low Vitamin D level contributes to fatigue and are associated with obesity, breast, and colon cancer. We will refill prescription Vitamin D for 1 month. We will recheck labs at her next office visit. Samantha Raymond will follow-up for routine testing of Vitamin D, at least 2-3 times per  year to avoid over-replacement.  - Vitamin D, Ergocalciferol, (DRISDOL) 1.25 MG (50000 UNIT) CAPS capsule; Take 1 capsule by mouth every 7 days.  Dispense: 4 capsule; Refill: 0  - VITAMIN D 25 Hydroxy (Vit-D Deficiency, Fractures)  3. Depression, unspecified - Emotional eating Behavior modification techniques were discussed today to help Samantha Raymond deal with her emotional/non-hunger eating behaviors. We will refill Wellbutrin SR for 1 month. She will continue with self-care activities. Orders and follow up as documented in patient record.   - buPROPion ER (WELLBUTRIN SR) 100 MG 12 hr tablet; Take 1 tablet by mouth twice daily  Dispense: 60 tablet;  Refill: 0  4. At risk for side effect of medication Due to Samantha Raymond's current conditions and medications, she is at a higher risk for drug side effect.  At least >8 minutes was spent on counseling her about these concerns today.  We discussed the benefits and potential risks of these medications, and all of patient's concerns were addressed and questions were answered.  she will call us, or their PCP or other specialists who treat their conditions with medications, with any questions or concerns that may develop.     5. Obesity with current BMI 36.0 Samantha Raymond is currently in the action stage of change. As such, her goal is to continue with weight loss efforts. She has agreed to change to keeping a food journal and adhering to recommended goals of 1300-1400 calories and 90+ grams of protein daily.   We will obtain non-fasting blood work at her next office visit.  Exercise goals: As is.  Behavioral modification strategies: increasing lean protein intake and no skipping meals.  Samantha Raymond has agreed to follow-up with our clinic in 3 weeks. She was informed of the importance of frequent follow-up visits to maximize her success with intensive lifestyle modifications for her multiple health conditions.   Objective:   Blood pressure 101/65, pulse 74, temperature 98.5 F (36.9 C), height '5\' 3"'$  (1.6 m), weight 203 lb (92.1 kg), SpO2 98 %. Body mass index is 35.96 kg/m.  General: Cooperative, alert, well developed, in no acute distress. HEENT: Conjunctivae and lids unremarkable. Cardiovascular: Regular rhythm.  Lungs: Normal work of breathing. Neurologic: No focal deficits.   Lab Results  Component Value Date   CREATININE 0.83 05/08/2020   BUN 13 05/08/2020   NA 141 05/08/2020   K 5.2 05/08/2020   CL 104 05/08/2020   CO2 23 05/08/2020   Lab Results  Component Value Date   ALT 33 (H) 05/08/2020   AST 32 05/08/2020   ALKPHOS 88 05/08/2020   BILITOT 0.4 05/08/2020   Lab Results  Component Value  Date   HGBA1C 6.0 (H) 05/08/2020   HGBA1C 5.9 (H) 11/08/2019   HGBA1C 5.8 (H) 11/03/2018   HGBA1C 5.8 (H) 10/28/2017   HGBA1C 5.8 (H) 03/19/2017   Lab Results  Component Value Date   INSULIN 9.8 06/18/2020   Lab Results  Component Value Date   TSH 0.537 05/08/2020   Lab Results  Component Value Date   CHOL 190 05/08/2020   HDL 63 05/08/2020   LDLCALC 109 (H) 05/08/2020   TRIG 98 05/08/2020   CHOLHDL 3.0 05/08/2020   Lab Results  Component Value Date   VD25OH 19.3 (L) 06/18/2020   VD25OH 29.45 (L) 10/08/2014   Lab Results  Component Value Date   WBC 4.3 06/18/2020   HGB 14.4 06/18/2020   HCT 43.8 06/18/2020   MCV 90 06/18/2020   PLT 236 06/18/2020   Lab  Results  Component Value Date   IRON 113 11/03/2013   TIBC 350 11/03/2013   FERRITIN 128 08/31/2014   Attestation Statements:   Reviewed by clinician on day of visit: allergies, medications, problem list, medical history, surgical history, family history, social history, and previous encounter notes.   Wilhemena Durie, am acting as transcriptionist for [employer], DO.  I have reviewed the above documentation for accuracy and completeness, and I agree with the above. Marjory Sneddon, D.O.  The [place] was signed into law in 2016 which includes the topic of electronic health records.  This provides immediate access to information in MyChart.  This includes consultation notes, operative notes, office notes, lab results and pathology reports.  If you have any questions about what you read please let us know at your next visit so we can discuss your concerns and take corrective action if need be.  We are right here with you.

## 2020-10-02 ENCOUNTER — Other Ambulatory Visit (HOSPITAL_BASED_OUTPATIENT_CLINIC_OR_DEPARTMENT_OTHER): Payer: Self-pay

## 2020-10-04 ENCOUNTER — Other Ambulatory Visit (HOSPITAL_BASED_OUTPATIENT_CLINIC_OR_DEPARTMENT_OTHER): Payer: Self-pay

## 2020-10-16 ENCOUNTER — Other Ambulatory Visit: Payer: Self-pay

## 2020-10-16 ENCOUNTER — Ambulatory Visit (INDEPENDENT_AMBULATORY_CARE_PROVIDER_SITE_OTHER): Payer: No Typology Code available for payment source | Admitting: Family Medicine

## 2020-10-16 ENCOUNTER — Other Ambulatory Visit (HOSPITAL_COMMUNITY): Payer: Self-pay

## 2020-10-16 ENCOUNTER — Encounter (INDEPENDENT_AMBULATORY_CARE_PROVIDER_SITE_OTHER): Payer: Self-pay | Admitting: Family Medicine

## 2020-10-16 VITALS — BP 130/85 | HR 69 | Temp 98.0°F | Ht 63.0 in | Wt 204.0 lb

## 2020-10-16 DIAGNOSIS — F32A Depression, unspecified: Secondary | ICD-10-CM | POA: Diagnosis not present

## 2020-10-16 DIAGNOSIS — Z9189 Other specified personal risk factors, not elsewhere classified: Secondary | ICD-10-CM

## 2020-10-16 DIAGNOSIS — R7303 Prediabetes: Secondary | ICD-10-CM | POA: Diagnosis not present

## 2020-10-16 DIAGNOSIS — E559 Vitamin D deficiency, unspecified: Secondary | ICD-10-CM | POA: Diagnosis not present

## 2020-10-16 DIAGNOSIS — Z6837 Body mass index (BMI) 37.0-37.9, adult: Secondary | ICD-10-CM

## 2020-10-16 DIAGNOSIS — E66812 Morbid (severe) obesity due to excess calories: Secondary | ICD-10-CM

## 2020-10-16 MED ORDER — VITAMIN D (ERGOCALCIFEROL) 1.25 MG (50000 UNIT) PO CAPS
50000.0000 [IU] | ORAL_CAPSULE | ORAL | 0 refills | Status: DC
  Filled 2020-10-16 – 2020-10-24 (×2): qty 4, 28d supply, fill #0

## 2020-10-16 MED ORDER — BUPROPION HCL ER (SR) 100 MG PO TB12
ORAL_TABLET | ORAL | 0 refills | Status: DC
  Filled 2020-10-16 – 2020-10-24 (×2): qty 60, 30d supply, fill #0

## 2020-10-16 MED ORDER — OZEMPIC (0.25 OR 0.5 MG/DOSE) 2 MG/1.5ML ~~LOC~~ SOPN
PEN_INJECTOR | SUBCUTANEOUS | 0 refills | Status: DC
  Filled 2020-10-16 – 2020-10-24 (×2): qty 1.5, 28d supply, fill #0

## 2020-10-22 NOTE — Progress Notes (Signed)
Chief Complaint:   OBESITY Samantha Raymond is here to discuss her progress with her obesity treatment plan along with follow-up of her obesity related diagnoses. Samantha Raymond is on keeping a food journal and adhering to recommended goals of 1300-1400 calories and 90 grams protein and states she is following her eating plan approximately 80% of the time. Samantha Raymond states she is walking, golfing, and using [place] 30 minutes 2 times per week.  Today's visit was #: 7 Starting weight: 213 lbs Starting date: 06/18/2020 Today's weight: 204 lbs Today's date: 10/16/2020 Total lbs lost to date: 9 Total lbs lost since last in-office visit: +1  Interim History: Samantha Raymond journaled and realizes she doesn't hit her protein goals and is over in calories half the time. She has increased her exercise from last OV and is getting on her [place] more and biking.  *unable to obtain blood work due to lab being closed.  Assessment/Plan:  No orders of the defined types were placed in this encounter.   Medications Discontinued During This Encounter  Medication Reason   Vitamin D, Ergocalciferol, (DRISDOL) 1.25 MG (50000 UNIT) CAPS capsule Reorder   Semaglutide,0.25 or 0.'5MG'$ /DOS, (OZEMPIC, 0.25 OR 0.5 MG/DOSE,) 2 MG/1.5ML SOPN Reorder   buPROPion ER (WELLBUTRIN SR) 100 MG 12 hr tablet Reorder     Meds ordered this encounter  Medications   buPROPion ER (WELLBUTRIN SR) 100 MG 12 hr tablet    Sig: Take 1 tablet by mouth twice daily    Dispense:  60 tablet    Refill:  0   Semaglutide,0.25 or 0.'5MG'$ /DOS, (OZEMPIC, 0.25 OR 0.5 MG/DOSE,) 2 MG/1.5ML SOPN    Sig: Inject 0.5 mg subcutaneously once a week    Dispense:  1.5 mL    Refill:  0   Vitamin D, Ergocalciferol, (DRISDOL) 1.25 MG (50000 UNIT) CAPS capsule    Sig: Take 1 capsule by mouth every 7 days.    Dispense:  4 capsule    Refill:  0     1. Depression, unspecified - Emotional eating Samantha Raymond is doing much better emotionally on bupropion. She feels more focused, in  control, and less agitated by stressors.  Plan: Continue to increase exercise, prayer or meditation.   Refill- buPROPion ER (WELLBUTRIN SR) 100 MG 12 hr tablet; Take 1 tablet by mouth twice daily  Dispense: 60 tablet; Refill: 0  2. Pre-diabetes Not at goal. Goal is HgbA1c < 5.7.  Medication: Ozempic.   Ozempic is working well with no side effects or concerns. Pt denies need for dose increase.   Plan:  She will continue to focus on protein-rich, low simple carbohydrate foods. We reviewed the importance of hydration, regular exercise for stress reduction, and restorative sleep.   Lab Results  Component Value Date   HGBA1C 6.0 (H) 05/08/2020   Lab Results  Component Value Date   INSULIN 9.8 06/18/2020   Refill- Semaglutide,0.25 or 0.'5MG'$ /DOS, (OZEMPIC, 0.25 OR 0.5 MG/DOSE,) 2 MG/1.5ML SOPN; Inject 0.5 mg subcutaneously once a week  Dispense: 1.5 mL; Refill: 0  3. Vitamin D deficiency Not at goal.   Plan: Continue to take prescription Vitamin D '@50'$ ,000 IU every week as prescribed.  Follow-up for routine testing of Vitamin D, at least 2-3 times per year to avoid over-replacement.  Lab Results  Component Value Date   VD25OH 19.3 (L) 06/18/2020   VD25OH 29.45 (L) 10/08/2014   Refill- Vitamin D, Ergocalciferol, (DRISDOL) 1.25 MG (50000 UNIT) CAPS capsule; Take 1 capsule by mouth every 7 days.  Dispense: 4 capsule; Refill: 0  4. At risk for activity intolerance Samantha Raymond is at risk for exercise intolerance due to current physical deconditioned state.  Samantha Raymond was given approximately 9 minutes of counseling today regarding her increased risk for exercise intolerance.  We discussed patient's specific personal and medical issues that raise our concern.  She was advised of strategies to prevent injury and ways to improve her cardiopulmonary fitness levels slowly over time.  We additionally discussed various fitness trackers and smart phone apps to help motivate the patient to stay on track.   5.  Obesity with current BMI of 36.3  Samantha Raymond is currently in the action stage of change. As such, her goal is to continue with weight loss efforts. She has agreed to keeping a food journal and adhering to recommended goals of 1300-1400 calories and 90+ grams protein.   Pt was given a log to track calories and proteins daily.  Handout: Protein Equivalents; High Protein Snack Options  Exercise goals:  As is- but increase to 30 minutes 3-5 days a week as tolerated.  Behavioral modification strategies: increasing lean protein intake, planning for success, and keeping a strict food journal.  Samantha Raymond has agreed to follow-up with our clinic in 2-3 weeks- pt aware to come in early for blood work. She was informed of the importance of frequent follow-up visits to maximize her success with intensive lifestyle modifications for her multiple health conditions.   Objective:   Blood pressure 130/85, pulse 69, temperature 98 F (36.7 C), height '5\' 3"'$  (1.6 m), weight 204 lb (92.5 kg), SpO2 98 %. Body mass index is 36.14 kg/m.  General: Cooperative, alert, well developed, in no acute distress. HEENT: Conjunctivae and lids unremarkable. Cardiovascular: Regular rhythm.  Lungs: Normal work of breathing. Neurologic: No focal deficits.   Lab Results  Component Value Date   CREATININE 0.83 05/08/2020   BUN 13 05/08/2020   NA 141 05/08/2020   K 5.2 05/08/2020   CL 104 05/08/2020   CO2 23 05/08/2020   Lab Results  Component Value Date   ALT 33 (H) 05/08/2020   AST 32 05/08/2020   ALKPHOS 88 05/08/2020   BILITOT 0.4 05/08/2020   Lab Results  Component Value Date   HGBA1C 6.0 (H) 05/08/2020   HGBA1C 5.9 (H) 11/08/2019   HGBA1C 5.8 (H) 11/03/2018   HGBA1C 5.8 (H) 10/28/2017   HGBA1C 5.8 (H) 03/19/2017   Lab Results  Component Value Date   INSULIN 9.8 06/18/2020   Lab Results  Component Value Date   TSH 0.537 05/08/2020   Lab Results  Component Value Date   CHOL 190 05/08/2020   HDL 63  05/08/2020   LDLCALC 109 (H) 05/08/2020   TRIG 98 05/08/2020   CHOLHDL 3.0 05/08/2020   Lab Results  Component Value Date   VD25OH 19.3 (L) 06/18/2020   VD25OH 29.45 (L) 10/08/2014   Lab Results  Component Value Date   WBC 4.3 06/18/2020   HGB 14.4 06/18/2020   HCT 43.8 06/18/2020   MCV 90 06/18/2020   PLT 236 06/18/2020   Lab Results  Component Value Date   IRON 113 11/03/2013   TIBC 350 11/03/2013   FERRITIN 128 08/31/2014   Attestation Statements:   Reviewed by clinician on day of visit: allergies, medications, problem list, medical history, surgical history, family history, social history, and previous encounter notes.  Coral Ceo, CMA, am acting as transcriptionist for [employer], DO.  I have reviewed the above documentation for accuracy  and completeness, and I agree with the above. Marjory Sneddon, D.O.  The [place] was signed into law in 2016 which includes the topic of electronic health records.  This provides immediate access to information in MyChart.  This includes consultation notes, operative notes, office notes, lab results and pathology reports.  If you have any questions about what you read please let us know at your next visit so we can discuss your concerns and take corrective action if need be.  We are right here with you.

## 2020-10-24 ENCOUNTER — Other Ambulatory Visit (HOSPITAL_COMMUNITY): Payer: Self-pay

## 2020-11-05 ENCOUNTER — Ambulatory Visit (INDEPENDENT_AMBULATORY_CARE_PROVIDER_SITE_OTHER): Payer: No Typology Code available for payment source | Admitting: Family Medicine

## 2020-11-13 ENCOUNTER — Other Ambulatory Visit: Payer: Self-pay

## 2020-11-13 ENCOUNTER — Ambulatory Visit (INDEPENDENT_AMBULATORY_CARE_PROVIDER_SITE_OTHER): Payer: No Typology Code available for payment source | Admitting: Internal Medicine

## 2020-11-13 ENCOUNTER — Encounter: Payer: Self-pay | Admitting: Internal Medicine

## 2020-11-13 VITALS — BP 118/74 | HR 61 | Temp 95.7°F | Ht 63.0 in | Wt 209.2 lb

## 2020-11-13 DIAGNOSIS — R7303 Prediabetes: Secondary | ICD-10-CM

## 2020-11-13 DIAGNOSIS — Z0000 Encounter for general adult medical examination without abnormal findings: Secondary | ICD-10-CM

## 2020-11-13 DIAGNOSIS — E559 Vitamin D deficiency, unspecified: Secondary | ICD-10-CM | POA: Diagnosis not present

## 2020-11-13 DIAGNOSIS — Z23 Encounter for immunization: Secondary | ICD-10-CM | POA: Diagnosis not present

## 2020-11-13 DIAGNOSIS — E034 Atrophy of thyroid (acquired): Secondary | ICD-10-CM

## 2020-11-13 DIAGNOSIS — F4329 Adjustment disorder with other symptoms: Secondary | ICD-10-CM

## 2020-11-13 DIAGNOSIS — K760 Fatty (change of) liver, not elsewhere classified: Secondary | ICD-10-CM

## 2020-11-13 DIAGNOSIS — Z1231 Encounter for screening mammogram for malignant neoplasm of breast: Secondary | ICD-10-CM

## 2020-11-13 LAB — COMPREHENSIVE METABOLIC PANEL
ALT: 33 U/L (ref 0–35)
AST: 27 U/L (ref 0–37)
Albumin: 4.5 g/dL (ref 3.5–5.2)
Alkaline Phosphatase: 87 U/L (ref 39–117)
BUN: 12 mg/dL (ref 6–23)
CO2: 30 mEq/L (ref 19–32)
Calcium: 9.5 mg/dL (ref 8.4–10.5)
Chloride: 103 mEq/L (ref 96–112)
Creatinine, Ser: 0.83 mg/dL (ref 0.40–1.20)
GFR: 75.97 mL/min (ref >60.00)
Glucose, Bld: 93 mg/dL (ref 70–99)
Potassium: 4.3 mEq/L (ref 3.5–5.1)
Sodium: 138 mEq/L (ref 135–145)
Total Bilirubin: 0.6 mg/dL (ref 0.2–1.2)
Total Protein: 6.8 g/dL (ref 6.0–8.3)

## 2020-11-13 LAB — VITAMIN D 25 HYDROXY (VIT D DEFICIENCY, FRACTURES): VITD: 40.51 ng/mL (ref 30.00–100.00)

## 2020-11-13 LAB — HEMOGLOBIN A1C: Hgb A1c MFr Bld: 5.8 % (ref 4.6–6.5)

## 2020-11-13 NOTE — Patient Instructions (Signed)
Good to see  you!  Keep me updated on Samantha Raymond!

## 2020-11-13 NOTE — Progress Notes (Signed)
Patient ID: Samantha Raymond, female    DOB: 08-14-1960  Age: 59 y.o. MRN: [id number]  The patient is here for annual preventive examination and management of other chronic and acute problems.   The risk factors are reflected in the social history.  The roster of all physicians providing medical care to patient - is listed in the Snapshot section of the chart.  Activities of daily living:  The patient is 100% independent in all ADLs: dressing, toileting, feeding as well as independent mobility  Home safety : The patient has smoke detectors in the home. They wear seatbelts.  There are no firearms at home. There is no violence in the home.   There is no risks for hepatitis, STDs or HIV. There is no   history of blood transfusion. They have no travel history to infectious disease endemic areas of the world.  The patient has seen their dentist in the last six month. She has  seen their eye doctor in the last year. She denies  slight hearing difficulty with regard to whispered voices and some television programs.  They have deferred audiologic testing in the last year.  They do not  have excessive sun exposure. Discussed the need for sun protection: hats, long sleeves and use of sunscreen if there is significant sun exposure.   Diet: the importance of a healthy diet is discussed. They do have a healthy diet.  The benefits of regular aerobic exercise were discussed. She walks 4 times per week ,  20 minutes.   Depression screen: there are no signs or vegative symptoms of depression- irritability, change in appetite, anhedonia, sadness/tearfullness.  Cognitive assessment: the patient manages all their financial and personal affairs and is actively engaged. They could relate day,date,year and events; recalled 2/3 objects at 3 minutes; performed clock-face test normally.  The following portions of the patient's history were reviewed and updated as appropriate: allergies, current medications, past  family history, past medical history,  past surgical history, past social history  and problem list.  Visual acuity was not assessed per patient preference since she has regular follow up with her ophthalmologist. Hearing and body mass index were assessed and reviewed.   During the course of the visit the patient was educated and counseled about appropriate screening and preventive services including : fall prevention , diabetes screening, nutrition counseling, colorectal cancer screening, and recommended immunizations.    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 Diagnoses of Prediabetes, Vitamin D insufficiency, Encounter for screening mammogram for malignant neoplasm of breast, Need for immunization against influenza, Fatty liver disease, nonalcoholic, Hypothyroidism due to acquired atrophy of thyroid, Stress and adjustment reaction, and Visit for preventive health examination were also pertinent to this visit.  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  Taking wellbutrin and lexapro  2) attending health weight and wellness .  Down 15 lbs  on ozempic   on 0.5 mg weekly dose.  Weighing at home ,  using Peloton bike to exercise more. Alternating between biking and walking .  Goal is < 180 lbs  History Samantha Raymond has a past medical history of Allergy, Asthma, Fatty liver, hypothyroid, Infertility, female, Menopause, Prediabetes, Premature ovarian failure, Recurrent urinary tract infection, and SOBOE (shortness of breath on exertion).   She has a past surgical history that includes Cesarean section and Colonoscopy.   Her family history includes Arthritis in her sister; Asthma in her brother and mother; Breast cancer in her  maternal aunt; Breast cancer (age of onset: 28) in her sister; Cancer in her maternal aunt; Cancer (age of onset: 7) in her sister; Diabetes in her father; Heart disease in her father; Hyperlipidemia in her  father; Hypertension in her father; Kidney disease in her father; Obesity in her mother; Stroke in her maternal aunt.She reports that she has never smoked. She has never used smokeless tobacco. She reports current alcohol use. She reports that she does not use drugs.  Outpatient Medications Prior to Visit  Medication Sig Dispense Refill   ADVAIR DISKUS 500-50 MCG/DOSE AEPB INHALE 1 PUFF 2 TIMES DAILY INTO THE LUNGS. RINSE MOUTH AFTER EACH USE TO PREVENT THRUSH 60 each 5   albuterol (PROVENTIL HFA;VENTOLIN HFA) 108 (90 Base) MCG/ACT inhaler Inhale 2 puffs into the lungs every 6 (six) hours as needed for wheezing or shortness of breath. 1 Inhaler 3   albuterol (PROVENTIL) (2.5 MG/3ML) 0.083% nebulizer solution Take 3 mLs (2.5 mg total) by nebulization every 6 (six) hours as needed for wheezing or shortness of breath. 45 mL 1   buPROPion ER (WELLBUTRIN SR) 100 MG 12 hr tablet Take 1 tablet by mouth twice daily 60 tablet 0   cetirizine (ZYRTEC) 10 MG tablet Take 10 mg by mouth daily.     escitalopram (LEXAPRO) 10 MG tablet TAKE 1 TABLET BY MOUTH ONCE DAILY 90 tablet 1   fluticasone (FLONASE) 50 MCG/ACT nasal spray Place into both nostrils daily.     levothyroxine (SYNTHROID) 25 MCG tablet TAKE 1 TABLET BY MOUTH DAILY BEFORE BREAKFAST 90 tablet 0   ondansetron (ZOFRAN ODT) 4 MG disintegrating tablet Take 1 tablet (4 mg total) by mouth every 8 (eight) hours as needed for nausea. 20 tablet 0   Semaglutide,0.25 or 0.5MG /DOS, (OZEMPIC, 0.25 OR 0.5 MG/DOSE,) 2 MG/1.5ML SOPN Inject 0.5 mg subcutaneously once a week 1.5 mL 0   Vitamin D, Ergocalciferol, (DRISDOL) 1.25 MG (50000 UNIT) CAPS capsule Take 1 capsule by mouth every 7 days. 4 capsule 0   Multiple Vitamin (MULTIVITAMIN ADULT PO) Take by mouth. (Patient not taking: Reported on 11/13/2020)     No facility-administered medications prior to visit.    Review of Systems  Patient denies headache, fevers, malaise, unintentional weight loss, skin rash, eye  pain, sinus congestion and sinus pain, sore throat, dysphagia,  hemoptysis , cough, dyspnea, wheezing, chest pain, palpitations, orthopnea, edema, abdominal pain, nausea, melena, diarrhea, constipation, flank pain, dysuria, hematuria, urinary  Frequency, nocturia, numbness, tingling, seizures,  Focal weakness, Loss of consciousness,  Tremor, insomnia, depression, anxiety, and suicidal ideation.    Objective:  BP 118/74 (BP Location: Left Arm, Patient Position: Sitting, Cuff Size: Large)   Pulse 61   Temp (!) 95.7 F (35.4 C) (Temporal)   Ht 5\' 3"  (1.6 m)   Wt 209 lb 3.2 oz (94.9 kg)   SpO2 99%   BMI 37.06 kg/m   Physical Exam  General appearance: alert, cooperative and appears stated age Head: Normocephalic, without obvious abnormality, atraumatic Eyes: conjunctivae/corneas clear. PERRL, EOM's intact. Fundi benign. Ears: normal TM's and external ear canals both ears Nose: Nares normal. Septum midline. Mucosa normal. No drainage or sinus tenderness. Throat: lips, mucosa, and tongue normal; teeth and gums normal Neck: no adenopathy, no carotid bruit, no JVD, supple, symmetrical, trachea midline and thyroid not enlarged, symmetric, no tenderness/mass/nodules Lungs: clear to auscultation bilaterally Breasts: normal appearance, no masses or tenderness Heart: regular rate and rhythm, S1, S2 normal, no murmur, click, rub or gallop Abdomen: soft, non-tender;  bowel sounds normal; no masses,  no organomegaly Extremities: extremities normal, atraumatic, no cyanosis or edema Pulses: 2+ and symmetric Skin: Skin color, texture, turgor normal. No rashes or lesions Neurologic: Alert and oriented X 3, normal strength and tone. Normal symmetric reflexes. Normal coordination and gait.     Assessment & Plan:   Fatty liver disease, nonalcoholic   Current liver enzymes are normal  and all modifiable risk factors including obesity  and hyperlipidemia have been addressed .   Lab Results  Component  Value Date   ALT 33 11/13/2020   AST 27 11/13/2020   ALKPHOS 87 11/13/2020   BILITOT 0.6 11/13/2020        Hypothyroidism She is taking 25 mcg daily .  Thyroid function is WNL on current dose.  No current changes needed.   Lab Results  Component Value Date   TSH 0.537 05/08/2020     Stress and adjustment reaction Secondary to job, home and family stressors.  She is managing her stress without medications and using exercise as a stress reliever .     Visit for preventive health examination age appropriate education and counseling updated, referrals for preventative services and immunizations addressed, dietary and smoking counseling addressed, most recent labs reviewed.  I have personally reviewed and have noted:   1) the patient's medical and social history 2) The pt's use of alcohol, tobacco, and illicit drugs 3) The patient's current medications and supplements 4) Functional ability including ADL's, fall risk, home safety risk, hearing and visual impairment 5) Diet and physical activities 6) Evidence for depression or mood disorder 7) The patient's height, weight, and BMI have been recorded in the chart   I have made referrals, and provided counseling and education based on review of the above  Updated Medication List Outpatient Encounter Medications as of 11/13/2020  Medication Sig   ADVAIR DISKUS 500-50 MCG/DOSE AEPB INHALE 1 PUFF 2 TIMES DAILY INTO THE LUNGS. RINSE MOUTH AFTER EACH USE TO PREVENT THRUSH   albuterol (PROVENTIL HFA;VENTOLIN HFA) 108 (90 Base) MCG/ACT inhaler Inhale 2 puffs into the lungs every 6 (six) hours as needed for wheezing or shortness of breath.   albuterol (PROVENTIL) (2.5 MG/3ML) 0.083% nebulizer solution Take 3 mLs (2.5 mg total) by nebulization every 6 (six) hours as needed for wheezing or shortness of breath.   buPROPion ER (WELLBUTRIN SR) 100 MG 12 hr tablet Take 1 tablet by mouth twice daily   cetirizine (ZYRTEC) 10 MG tablet Take 10 mg by  mouth daily.   escitalopram (LEXAPRO) 10 MG tablet TAKE 1 TABLET BY MOUTH ONCE DAILY   fluticasone (FLONASE) 50 MCG/ACT nasal spray Place into both nostrils daily.   levothyroxine (SYNTHROID) 25 MCG tablet TAKE 1 TABLET BY MOUTH DAILY BEFORE BREAKFAST   ondansetron (ZOFRAN ODT) 4 MG disintegrating tablet Take 1 tablet (4 mg total) by mouth every 8 (eight) hours as needed for nausea.   Semaglutide,0.25 or 0.5MG /DOS, (OZEMPIC, 0.25 OR 0.5 MG/DOSE,) 2 MG/1.5ML SOPN Inject 0.5 mg subcutaneously once a week   Vitamin D, Ergocalciferol, (DRISDOL) 1.25 MG (50000 UNIT) CAPS capsule Take 1 capsule by mouth every 7 days.   [DISCONTINUED] Multiple Vitamin (MULTIVITAMIN ADULT PO) Take by mouth. (Patient not taking: Reported on 11/13/2020)   No facility-administered encounter medications on file as of 11/13/2020.      Medications Discontinued During This Encounter  Medication Reason   Multiple Vitamin (MULTIVITAMIN ADULT PO)     Follow-up: No follow-ups on file.   Crecencio Mc,  MD   

## 2020-11-14 NOTE — Assessment & Plan Note (Signed)

## 2020-11-14 NOTE — Assessment & Plan Note (Signed)
She is taking 25 mcg daily .  Thyroid function is WNL on current dose.  No current changes needed.   Lab Results  Component Value Date   TSH 0.537 05/08/2020

## 2020-11-14 NOTE — Assessment & Plan Note (Signed)
Current liver enzymes are normal  and all modifiable risk factors including obesity  and hyperlipidemia have been addressed .   Lab Results  Component Value Date   ALT 33 11/13/2020   AST 27 11/13/2020   ALKPHOS 87 11/13/2020   BILITOT 0.6 11/13/2020

## 2020-11-14 NOTE — Assessment & Plan Note (Addendum)
Secondary to job, home and family stressors.  She is managing her stress without medications and using exercise as a stress reliever .    

## 2020-11-20 ENCOUNTER — Ambulatory Visit (INDEPENDENT_AMBULATORY_CARE_PROVIDER_SITE_OTHER): Payer: No Typology Code available for payment source | Admitting: Family Medicine

## 2020-12-02 ENCOUNTER — Other Ambulatory Visit: Payer: Self-pay | Admitting: Internal Medicine

## 2020-12-02 ENCOUNTER — Other Ambulatory Visit (HOSPITAL_COMMUNITY): Payer: Self-pay

## 2020-12-02 DIAGNOSIS — R7303 Prediabetes: Secondary | ICD-10-CM

## 2020-12-03 ENCOUNTER — Other Ambulatory Visit (HOSPITAL_COMMUNITY): Payer: Self-pay

## 2020-12-03 MED FILL — Semaglutide Soln Pen-inj 0.25 or 0.5 MG/DOSE (2 MG/1.5ML): SUBCUTANEOUS | 28 days supply | Qty: 1.5 | Fill #0 | Status: AC

## 2020-12-13 ENCOUNTER — Other Ambulatory Visit (HOSPITAL_COMMUNITY): Payer: Self-pay

## 2020-12-13 MED ORDER — PHENTERMINE HCL 15 MG PO CAPS
15.0000 mg | ORAL_CAPSULE | Freq: Every day | ORAL | 2 refills | Status: DC
  Filled 2020-12-13: qty 30, 30d supply, fill #0
  Filled 2021-02-05: qty 30, 30d supply, fill #1

## 2020-12-18 ENCOUNTER — Other Ambulatory Visit: Payer: Self-pay | Admitting: Internal Medicine

## 2020-12-18 ENCOUNTER — Other Ambulatory Visit (HOSPITAL_COMMUNITY): Payer: Self-pay

## 2020-12-18 DIAGNOSIS — J4521 Mild intermittent asthma with (acute) exacerbation: Secondary | ICD-10-CM

## 2020-12-18 MED ORDER — ALBUTEROL SULFATE HFA 108 (90 BASE) MCG/ACT IN AERS
2.0000 | INHALATION_SPRAY | Freq: Four times a day (QID) | RESPIRATORY_TRACT | 3 refills | Status: DC | PRN
  Filled 2020-12-18: qty 18, 25d supply, fill #0

## 2021-01-17 ENCOUNTER — Other Ambulatory Visit: Payer: Self-pay | Admitting: Internal Medicine

## 2021-01-17 ENCOUNTER — Other Ambulatory Visit (HOSPITAL_COMMUNITY): Payer: Self-pay

## 2021-01-17 DIAGNOSIS — Z1231 Encounter for screening mammogram for malignant neoplasm of breast: Secondary | ICD-10-CM

## 2021-01-17 DIAGNOSIS — R7303 Prediabetes: Secondary | ICD-10-CM

## 2021-01-17 MED ORDER — OZEMPIC (0.25 OR 0.5 MG/DOSE) 2 MG/1.5ML ~~LOC~~ SOPN
PEN_INJECTOR | SUBCUTANEOUS | 0 refills | Status: DC
  Filled 2021-01-17 – 2021-02-05 (×2): qty 1.5, 28d supply, fill #0

## 2021-01-27 ENCOUNTER — Other Ambulatory Visit (HOSPITAL_COMMUNITY): Payer: Self-pay

## 2021-02-04 ENCOUNTER — Encounter: Payer: Self-pay | Admitting: Internal Medicine

## 2021-02-04 ENCOUNTER — Telehealth: Payer: Self-pay

## 2021-02-04 NOTE — Telephone Encounter (Signed)
Good morning, I received a positive Covid test this morning. I was exposed last Tuesday when I was with Cornelia Copa! We thought he wasn't feeling well post treatment until his temp spiked.    I am treating the fever and other symptoms as needed. Is there anything else I can/should be doing?  I'm not wheezing too badly and wasn't sure if a neb treatment would help the process...just looking for anything to move it along!   Thanks Samantha Raymond

## 2021-02-05 ENCOUNTER — Other Ambulatory Visit (INDEPENDENT_AMBULATORY_CARE_PROVIDER_SITE_OTHER): Payer: Self-pay | Admitting: Internal Medicine

## 2021-02-05 ENCOUNTER — Other Ambulatory Visit: Payer: Self-pay | Admitting: Internal Medicine

## 2021-02-05 ENCOUNTER — Other Ambulatory Visit (INDEPENDENT_AMBULATORY_CARE_PROVIDER_SITE_OTHER): Payer: Self-pay | Admitting: Family Medicine

## 2021-02-05 ENCOUNTER — Other Ambulatory Visit (HOSPITAL_COMMUNITY): Payer: Self-pay

## 2021-02-05 DIAGNOSIS — F32A Depression, unspecified: Secondary | ICD-10-CM

## 2021-02-05 MED ORDER — PREDNISONE 10 MG PO TABS
ORAL_TABLET | ORAL | 0 refills | Status: DC
  Filled 2021-02-05: qty 21, 6d supply, fill #0

## 2021-02-05 MED ORDER — MOLNUPIRAVIR EUA 200MG CAPSULE
4.0000 | ORAL_CAPSULE | Freq: Two times a day (BID) | ORAL | 0 refills | Status: AC
  Filled 2021-02-05: qty 40, 5d supply, fill #0

## 2021-02-05 MED ORDER — ESCITALOPRAM OXALATE 10 MG PO TABS
ORAL_TABLET | Freq: Every day | ORAL | 1 refills | Status: DC
  Filled 2021-02-05: qty 90, 90d supply, fill #0
  Filled 2021-06-06: qty 90, 90d supply, fill #1

## 2021-02-05 NOTE — Addendum Note (Signed)
Addended by: Crecencio Mc on: 02/05/2021 09:39 AM   Modules accepted: Orders

## 2021-02-05 NOTE — Telephone Encounter (Signed)
Spoke with pt and informed her of the medications that have been sent in. Pt gave a verbal understanding.

## 2021-02-05 NOTE — Telephone Encounter (Signed)
Patient called in, returning call to [facility]. Please call patient back @  [phone]

## 2021-02-05 NOTE — Telephone Encounter (Signed)
LMTCB

## 2021-02-05 NOTE — Telephone Encounter (Signed)
Molnupiravir and prednisone taper sent to [facility] outpatient pharmacy.  Needs prednisone due to the wheezing.  If wheezing or cough gets worse she will need to be seen by someone to determine if a chest  xray or abx are needed

## 2021-02-05 NOTE — Telephone Encounter (Signed)
Spoke with pt and she stated that she tested positive for covid yesterday morning. Pt stated that she has a productive cough, some wheezing(not as bad as yesterday) fever, loss of taste, bilateral ear pain, sinus pain and pressure, runny nose. Pt stated that the only thing she is taking is ibuprofen to hep with the fever and she is drinking lots of fluids. Pt would like to know if there is anything else that she need should take?

## 2021-02-06 ENCOUNTER — Other Ambulatory Visit (HOSPITAL_COMMUNITY): Payer: Self-pay

## 2021-02-06 MED ORDER — BUPROPION HCL ER (SR) 100 MG PO TB12
ORAL_TABLET | ORAL | 0 refills | Status: DC
  Filled 2021-02-06: qty 60, 30d supply, fill #0

## 2021-02-10 ENCOUNTER — Encounter: Payer: Self-pay | Admitting: Internal Medicine

## 2021-02-11 ENCOUNTER — Telehealth: Payer: No Typology Code available for payment source | Admitting: Emergency Medicine

## 2021-02-11 DIAGNOSIS — K140 Glossitis: Secondary | ICD-10-CM | POA: Diagnosis not present

## 2021-02-11 NOTE — Progress Notes (Signed)
E-Visit for Mouth Ulcers  We are sorry that you are not feeling well.  Here is how we plan to help!  I would see how you feel in a day or two.  If your symptoms persist or worsen, then we can trial you on something for thrush.  But based on the picture, I think let's wait a day or two.  Mouth ulcers are painful areas in the mouth and gums. These are also known as canker sores.  They can occur anywhere inside the mouth. While mostly harmless, mouth ulcers can be extremely uncomfortable and may make it difficult to eat, drink, and brush your teeth.  You may have more than 1 ulcer and they can vary and change in size. Mouth ulcers are not contagious and should not be confused with cold sores.  Cold sores appear on the lip or around the outside of the mouth and often begin with a tingling, burning or itching sensation.   While the exact causes are unknown, some common causes and factors that may aggravate mouth ulcers include: Genetics - Sometimes mouth ulcers run in families High alcohol intake Acidic foods such as citrus fruits like pineapple, grapefruit, orange fruits/juices, may aggravate mouth ulcers Other foods high in acidity or spice such as coffee, chocolate, chips, pretzels, eggs, nuts, cheese Quitting smoking Injury caused by biting the tongue or inside of the cheek Diet lacking in C-00, zinc, folic acid or iron Female hormone shifts with menstruation Excessive fatigue, emotional stress or anxiety Prevention: Talk to your doctor if you are taking meds that are known to cause mouth ulcers such as:   Anti-inflammatory drugs (for example Ibuprofen, Naproxen sodium), pain killers, Beta blockers, Oral nicotine replacement drugs, Some street drugs (heroin).   Avoid allowing any tablets to dissolve in your mouth that are meant to swallowed whole Avoid foods/drinks that trigger or worsen symptoms Keep your mouth clean with daily brushing and flossing  Home Care: The goal with treatment is  to ease the pain where ulcers occur and help them heal as quickly as possible.  There is no medical treatment to prevent mouth ulcers from coming back or recurring.  Avoid spicy and acidic foods Eat soft foods and avoid rough, crunchy foods Avoid chewing gum Do not use toothpaste that contains sodium lauryl sulphite Use a straw to drink which helps avoid liquids toughing the ulcers near the front of your mouth Use a very soft toothbrush If you have dentures or dental hardware that you feel is not fitting well or contributing to his, please see your dentist. Use saltwater mouthwash which helps healing. Dissolve a  teaspoon of salt in a glass of warm water. Swish around your mouth and spit it out. This can be used as needed if it is soothing.   GET HELP RIGHT AWAY IF: Persistent ulcers require checking IN PERSON (face to face). Any mouth lesion lasting longer than a month should be seen by your DENTIST as soon as possible for evaluation for possible oral cancer. If you have a non-painful ulcer in 1 or more areas of your mouth Ulcers that are spreading, are very large or particularly painful Ulcers last longer than one week without improving on treatment If you develop a fever, swollen glands and begin to feel unwell Ulcers that developed after starting a new medication MAKE SURE YOU: Understand these instructions. Will watch your condition. Will get help right away if you are not doing well or get worse.  Thank you for choosing  an e-visit.  Your e-visit answers were reviewed by a board certified advanced clinical practitioner to complete your personal care plan. Depending upon the condition, your plan could have included both over the counter or prescription medications.  Please review your pharmacy choice. Make sure the pharmacy is open so you can pick up prescription now. If there is a problem, you may contact your provider through [employer] and have the prescription routed to  another pharmacy.  Your safety is important to [place]. If you have drug allergies check your prescription carefully.   For the next 24 hours you can use MyChart to ask questions about today's visit, request a non-urgent call back, or ask for a work or school excuse. You will get an email in the next two days asking about your experience. I hope that your e-visit has been valuable and will speed your recovery.  Approximately 5 minutes was used in reviewing the patient's chart, questionnaire, prescribing medications, and documentation.

## 2021-02-11 NOTE — Telephone Encounter (Signed)
noted 

## 2021-02-11 NOTE — Telephone Encounter (Signed)
LMTCB to schedule pt for a virtual visit.

## 2021-02-11 NOTE — Telephone Encounter (Signed)
Patient returned office phone call. Patient stated she would go to her MyChart and do a e-visit.

## 2021-02-14 ENCOUNTER — Other Ambulatory Visit (HOSPITAL_COMMUNITY): Payer: Self-pay

## 2021-02-21 ENCOUNTER — Ambulatory Visit
Admission: RE | Admit: 2021-02-21 | Discharge: 2021-02-21 | Disposition: A | Discharge status: Home / Self Care | Payer: No Typology Code available for payment source | Source: Ambulatory Visit | Attending: Internal Medicine | Admitting: Internal Medicine

## 2021-02-21 DIAGNOSIS — Z1231 Encounter for screening mammogram for malignant neoplasm of breast: Secondary | ICD-10-CM

## 2021-02-28 ENCOUNTER — Ambulatory Visit: Payer: No Typology Code available for payment source | Admitting: Internal Medicine

## 2021-03-13 ENCOUNTER — Other Ambulatory Visit: Payer: Self-pay | Admitting: Internal Medicine

## 2021-03-13 ENCOUNTER — Other Ambulatory Visit (HOSPITAL_COMMUNITY): Payer: Self-pay

## 2021-03-13 DIAGNOSIS — R7303 Prediabetes: Secondary | ICD-10-CM

## 2021-03-13 MED ORDER — OZEMPIC (0.25 OR 0.5 MG/DOSE) 2 MG/1.5ML ~~LOC~~ SOPN
PEN_INJECTOR | SUBCUTANEOUS | 0 refills | Status: DC
  Filled 2021-03-13: qty 1.5, 28d supply, fill #0

## 2021-04-04 ENCOUNTER — Encounter: Payer: Self-pay | Admitting: Internal Medicine

## 2021-04-04 ENCOUNTER — Other Ambulatory Visit: Payer: Self-pay | Admitting: Adult Health

## 2021-04-04 ENCOUNTER — Other Ambulatory Visit (HOSPITAL_BASED_OUTPATIENT_CLINIC_OR_DEPARTMENT_OTHER): Payer: Self-pay

## 2021-04-04 ENCOUNTER — Other Ambulatory Visit (HOSPITAL_COMMUNITY): Payer: Self-pay

## 2021-04-04 MED ORDER — MOLNUPIRAVIR 200 MG PO CAPS
ORAL_CAPSULE | ORAL | 0 refills | Status: DC
  Filled 2021-04-04: qty 40, 5d supply, fill #0

## 2021-04-04 MED ORDER — PREDNISONE 10 MG (21) PO TBPK
ORAL_TABLET | ORAL | 0 refills | Status: DC
  Filled 2021-04-04: qty 21, 6d supply, fill #0

## 2021-04-04 MED ORDER — CARESTART COVID-19 HOME TEST VI KIT
PACK | 0 refills | Status: DC
  Filled 2021-04-04: qty 2, 4d supply, fill #0

## 2021-04-04 NOTE — Progress Notes (Signed)
Patient with positive COVID test. Reached out to her PCP Dr. Derrel Nip, for Rx for molnupiravir and steroid dose pack. I called Dr. Lupita Dawn office to ask if we could call it in.  She approved the Rx.  Called in Rx to [facility].

## 2021-04-07 ENCOUNTER — Other Ambulatory Visit (HOSPITAL_BASED_OUTPATIENT_CLINIC_OR_DEPARTMENT_OTHER): Payer: Self-pay

## 2021-04-07 NOTE — Telephone Encounter (Signed)
LMTCB to triage & see how patient is doing.

## 2021-04-07 NOTE — Telephone Encounter (Signed)
I spoke with patient & she said that she actually was able to have another NP sent her in the antiviral per Dr. Lupita Dawn recommendations. She was able to start the prednisone & molnupiravir on Saturday. Pt stated that she was taking & that she would let us know if anything further needed.

## 2021-04-14 ENCOUNTER — Other Ambulatory Visit (HOSPITAL_COMMUNITY): Payer: Self-pay

## 2021-04-14 ENCOUNTER — Other Ambulatory Visit (INDEPENDENT_AMBULATORY_CARE_PROVIDER_SITE_OTHER): Payer: Self-pay | Admitting: Internal Medicine

## 2021-04-14 ENCOUNTER — Other Ambulatory Visit: Payer: Self-pay | Admitting: Internal Medicine

## 2021-04-14 ENCOUNTER — Telehealth: Payer: No Typology Code available for payment source | Admitting: Nurse Practitioner

## 2021-04-14 DIAGNOSIS — J0140 Acute pansinusitis, unspecified: Secondary | ICD-10-CM

## 2021-04-14 DIAGNOSIS — F32A Depression, unspecified: Secondary | ICD-10-CM

## 2021-04-14 MED ORDER — AMOXICILLIN-POT CLAVULANATE 875-125 MG PO TABS
1.0000 | ORAL_TABLET | Freq: Two times a day (BID) | ORAL | 0 refills | Status: AC
  Filled 2021-04-14: qty 14, 7d supply, fill #0

## 2021-04-14 NOTE — Progress Notes (Signed)
E-Visit for Sinus Problems  We are sorry that you are not feeling well.  Here is how we plan to help!  Based on what you have shared with me it looks like you have sinusitis.  Sinusitis is inflammation and infection in the sinus cavities of the head.  Based on your presentation I believe you most likely have Acute Bacterial Sinusitis.  This is an infection caused by bacteria and is treated with antibiotics. I have prescribed Augmentin 875mg /125mg  one tablet twice daily with food, for 7 days. You may use an oral decongestant such as Mucinex D or if you have glaucoma or high blood pressure use plain Mucinex. Saline nasal spray help and can safely be used as often as needed for congestion.  If you develop worsening sinus pain, fever or notice severe headache and vision changes, or if symptoms are not better after completion of antibiotic, please schedule an appointment with a health care provider.    Be sure to stay on your inhalers as well, if symptoms progress to your chest we recommend having an in person follow up.   Sinus infections are not as easily transmitted as other respiratory infection, however we still recommend that you avoid close contact with loved ones, especially the very young and elderly.  Remember to wash your hands thoroughly throughout the day as this is the number one way to prevent the spread of infection!  Home Care: Only take medications as instructed by your medical team. Complete the entire course of an antibiotic. Do not take these medications with alcohol. A steam or ultrasonic humidifier can help congestion.  You can place a towel over your head and breathe in the steam from hot water coming from a faucet. Avoid close contacts especially the very young and the elderly. Cover your mouth when you cough or sneeze. Always remember to wash your hands.  Get Help Right Away If: You develop worsening fever or sinus pain. You develop a severe head ache or visual  changes. Your symptoms persist after you have completed your treatment plan.  Make sure you Understand these instructions. Will watch your condition. Will get help right away if you are not doing well or get worse.  Thank you for choosing an e-visit.  Your e-visit answers were reviewed by a board certified advanced clinical practitioner to complete your personal care plan. Depending upon the condition, your plan could have included both over the counter or prescription medications.  Please review your pharmacy choice. Make sure the pharmacy is open so you can pick up prescription now. If there is a problem, you may contact your provider through [employer] and have the prescription routed to another pharmacy.  Your safety is important to [place]. If you have drug allergies check your prescription carefully.   For the next 24 hours you can use MyChart to ask questions about today's visit, request a non-urgent call back, or ask for a work or school excuse. You will get an email in the next two days asking about your experience. I hope that your e-visit has been valuable and will speed your recovery.   I spent approximately 10 minutes reviewing the patient's history, current symptoms and coordinating their care today.    Meds ordered this encounter  Medications   amoxicillin-clavulanate (AUGMENTIN) 875-125 MG tablet    Sig: Take 1 tablet by mouth 2 (two) times daily for 7 days. Take with food    Dispense:  14 tablet    Refill:  0

## 2021-04-15 ENCOUNTER — Other Ambulatory Visit (HOSPITAL_COMMUNITY): Payer: Self-pay

## 2021-04-15 ENCOUNTER — Encounter: Payer: Self-pay | Admitting: Internal Medicine

## 2021-04-15 DIAGNOSIS — R7303 Prediabetes: Secondary | ICD-10-CM

## 2021-04-15 DIAGNOSIS — E034 Atrophy of thyroid (acquired): Secondary | ICD-10-CM

## 2021-04-15 DIAGNOSIS — K760 Fatty (change of) liver, not elsewhere classified: Secondary | ICD-10-CM

## 2021-04-15 DIAGNOSIS — E785 Hyperlipidemia, unspecified: Secondary | ICD-10-CM

## 2021-04-15 DIAGNOSIS — R5383 Other fatigue: Secondary | ICD-10-CM

## 2021-04-15 MED ORDER — FLUTICASONE-SALMETEROL 500-50 MCG/ACT IN AEPB
INHALATION_SPRAY | RESPIRATORY_TRACT | 5 refills | Status: DC
  Filled 2021-04-15: qty 60, 30d supply, fill #0

## 2021-04-15 NOTE — Telephone Encounter (Signed)
Pt is coming on March 3rd for a follow up appt. Pt would like to have labs done prior to appt if needed. I have ordered A1c, CMP, CBC, TSH and Lipid panel. Is there anything else that needs to be ordered?

## 2021-04-17 ENCOUNTER — Other Ambulatory Visit (HOSPITAL_COMMUNITY): Payer: Self-pay

## 2021-04-17 ENCOUNTER — Other Ambulatory Visit (INDEPENDENT_AMBULATORY_CARE_PROVIDER_SITE_OTHER): Payer: Self-pay | Admitting: Internal Medicine

## 2021-04-17 DIAGNOSIS — F32A Depression, unspecified: Secondary | ICD-10-CM

## 2021-04-18 ENCOUNTER — Encounter: Payer: Self-pay | Admitting: Internal Medicine

## 2021-04-18 ENCOUNTER — Other Ambulatory Visit: Payer: Self-pay

## 2021-04-18 ENCOUNTER — Other Ambulatory Visit (HOSPITAL_COMMUNITY): Payer: Self-pay

## 2021-04-18 ENCOUNTER — Ambulatory Visit (INDEPENDENT_AMBULATORY_CARE_PROVIDER_SITE_OTHER): Payer: No Typology Code available for payment source

## 2021-04-18 ENCOUNTER — Ambulatory Visit: Payer: No Typology Code available for payment source | Admitting: Internal Medicine

## 2021-04-18 VITALS — BP 100/68 | HR 74 | Temp 98.4°F | Resp 16 | Ht 63.0 in | Wt 201.6 lb

## 2021-04-18 DIAGNOSIS — R0789 Other chest pain: Secondary | ICD-10-CM

## 2021-04-18 DIAGNOSIS — R0609 Other forms of dyspnea: Secondary | ICD-10-CM

## 2021-04-18 LAB — D-DIMER, QUANTITATIVE: D-Dimer, Quant: 0.29 mcg/mL FEU (ref <0.50)

## 2021-04-18 LAB — CBC WITH DIFFERENTIAL/PLATELET
Basophils Absolute: 0 10*3/uL (ref 0.0–0.1)
Basophils Relative: 0.9 % (ref 0.0–3.0)
Eosinophils Absolute: 0 10*3/uL (ref 0.0–0.7)
Eosinophils Relative: 0.8 % (ref 0.0–5.0)
HCT: 40.5 % (ref 36.0–46.0)
Hemoglobin: 13.6 g/dL (ref 12.0–15.0)
Lymphocytes Relative: 25.6 % (ref 12.0–46.0)
Lymphs Abs: 1.1 10*3/uL (ref 0.7–4.0)
MCHC: 33.5 g/dL (ref 30.0–36.0)
MCV: 90.5 fl (ref 78.0–100.0)
Monocytes Absolute: 0.5 10*3/uL (ref 0.1–1.0)
Monocytes Relative: 10.9 % (ref 3.0–12.0)
Neutro Abs: 2.7 10*3/uL (ref 1.4–7.7)
Neutrophils Relative %: 61.8 % (ref 43.0–77.0)
Platelets: 226 10*3/uL (ref 150.0–400.0)
RBC: 4.48 Mil/uL (ref 3.87–5.11)
RDW: 13.1 % (ref 11.5–15.5)
WBC: 4.3 10*3/uL (ref 4.0–10.5)

## 2021-04-18 LAB — COMPREHENSIVE METABOLIC PANEL
ALT: 40 U/L — ABNORMAL HIGH (ref 0–35)
AST: 33 U/L (ref 0–37)
Albumin: 4.3 g/dL (ref 3.5–5.2)
Alkaline Phosphatase: 78 U/L (ref 39–117)
BUN: 12 mg/dL (ref 6–23)
CO2: 28 mEq/L (ref 19–32)
Calcium: 9.6 mg/dL (ref 8.4–10.5)
Chloride: 105 mEq/L (ref 96–112)
Creatinine, Ser: 0.84 mg/dL (ref 0.40–1.20)
GFR: 74.66 mL/min (ref >60.00)
Glucose, Bld: 81 mg/dL (ref 70–99)
Potassium: 4.2 mEq/L (ref 3.5–5.1)
Sodium: 138 mEq/L (ref 135–145)
Total Bilirubin: 0.5 mg/dL (ref 0.2–1.2)
Total Protein: 6.8 g/dL (ref 6.0–8.3)

## 2021-04-18 LAB — TROPONIN I (HIGH SENSITIVITY): High Sens Troponin I: 5 ng/L (ref 2–17)

## 2021-04-18 LAB — CK TOTAL AND CKMB (NOT AT ARMC)
CK, MB: 1.5 ng/mL (ref 0–5.0)
Relative Index: 1.6 (ref 0–4.0)
Total CK: 92 U/L (ref 29–143)

## 2021-04-18 MED ORDER — BUPROPION HCL ER (SR) 100 MG PO TB12
ORAL_TABLET | ORAL | 0 refills | Status: DC
  Filled 2021-04-18: qty 60, 30d supply, fill #0

## 2021-04-18 MED ORDER — GUAIFENESIN-CODEINE 100-10 MG/5ML PO SYRP
5.0000 mL | ORAL_SOLUTION | Freq: Three times a day (TID) | ORAL | 0 refills | Status: DC | PRN
  Filled 2021-04-18: qty 120, 8d supply, fill #0

## 2021-04-18 NOTE — Assessment & Plan Note (Signed)
Post COVID infection.  Referring to cardiology given normal lung exam  ?

## 2021-04-18 NOTE — Assessment & Plan Note (Signed)
I have ordered and reviewed a 12 lead EKG and find that there are no acute changes and patient is in sinus rhythm.    Chest x ray ordered,  D Dimer and cardiac enzymes.   ?

## 2021-04-18 NOTE — Patient Instructions (Addendum)
EKG is unchanged ? ?Continue Advair and albuterol for now ? ?Cough suppressant sent to pharmacy ? ?Cardiology referral in progress  ?

## 2021-04-18 NOTE — Progress Notes (Signed)
Subjective:  Patient ID: Samantha Raymond, female    DOB: 1961-01-06  Age: 61 y.o. MRN: [id number]  CC: The primary encounter diagnosis was Sensation of chest tightness. A diagnosis of Dyspnea on exertion was also pertinent to this visit.   This visit occurred during the SARS-CoV-2 public health emergency.  Safety protocols were in place, including screening questions prior to the visit, additional usage of staff PPE, and extensive cleaning of exam room while observing appropriate contact time as indicated for disinfecting solutions.    HPI Samantha Raymond presents for follow up on recent COVID infection  Chief Complaint  Patient presents with   Follow-up    Follow up covid. Pt tested positive for covid on 04/04/2021. Took the antiviral. Recovered, then developed a sinus infection. Currently taking Augmentin. Pt is still having chest tightness and having to use her albuterol inhaler more often.     This visit occurred during the SARS-CoV-2 public health emergency.  Safety protocols were in place, including screening questions prior to the visit, additional usage of staff PPE, and extensive cleaning of exam room while observing appropriate contact time as indicated for disinfecting solutions.   61 yr old female with  history of RAD,  no prior PFTs,  fatty liver presents for post COVID symptoms.  1) chest tightness with minimal exertion . Symptoms started after her second COVID infection (first one was on Dec 24)  which was diagnosed on Feb 17.   First COVID infection was less notable for respiratory symptoms,  mostly just fatigue,  which lasted 1 week.  2nd infection was diagnosed Feb 17,  husband was hospitalized with same,  and symptoms included fever to 101 , rhinitis and productive cough.  Fevers have resolved , still having a cough that is occasionally productive of yellow sputum.   Feels fine at rest,  but with any exertion she becomes tremulous,  short of breath, weak kneed  and develops chest tightness. .  Has resumed use of Advair as of last week. Using albuterol 1-2 times daily which helps the chest tightness    Patient was walked in the office by CMA for 2 minutes walk , until she began feeling short of breath , tight in the chest and weak in the knees.  Vital signs  showed no drop in sats,  but her pulse increased to 111 and she became diaphoretic     Outpatient Medications Prior to Visit  Medication Sig Dispense Refill   albuterol (PROVENTIL) (2.5 MG/3ML) 0.083% nebulizer solution Take 3 mLs (2.5 mg total) by nebulization every 6 (six) hours as needed for wheezing or shortness of breath. 45 mL 1   albuterol (VENTOLIN HFA) 108 (90 Base) MCG/ACT inhaler Inhale 2 puffs into the lungs every 6 (six) hours as needed for wheezing or shortness of breath. 18 g 3   amoxicillin-clavulanate (AUGMENTIN) 875-125 MG tablet Take 1 tablet by mouth 2 (two) times daily for 7 days. Take with food 14 tablet 0   buPROPion ER (WELLBUTRIN SR) 100 MG 12 hr tablet Take 1 tablet by mouth twice daily 60 tablet 0   cetirizine (ZYRTEC) 10 MG tablet Take 10 mg by mouth daily.     escitalopram (LEXAPRO) 10 MG tablet TAKE 1 TABLET BY MOUTH ONCE DAILY 90 tablet 1   fluticasone (FLONASE) 50 MCG/ACT nasal spray Place into both nostrils daily.     fluticasone-salmeterol (ADVAIR) 500-50 MCG/ACT AEPB INHALE 1 PUFF 2 TIMES DAILY INTO THE LUNGS. RINSE MOUTH AFTER  EACH USE TO PREVENT THRUSH 60 each 5   levothyroxine (SYNTHROID) 25 MCG tablet TAKE 1 TABLET BY MOUTH DAILY BEFORE BREAKFAST 90 tablet 0   ondansetron (ZOFRAN ODT) 4 MG disintegrating tablet Take 1 tablet (4 mg total) by mouth every 8 (eight) hours as needed for nausea. 20 tablet 0   Semaglutide,0.25 or 0.5MG/DOS, (OZEMPIC, 0.25 OR 0.5 MG/DOSE,) 2 MG/1.5ML SOPN Inject 0.5 mg under the skin once a week 1.5 mL 0   COVID-19 At Home Antigen Test (CARESTART COVID-19 HOME TEST) KIT Use as directed within package instruction (Patient not taking:  Reported on 04/18/2021) 2 kit 0   molnupiravir EUA (LAGEVRIO) 200 MG CAPS capsule Take 4 capsules by mouth every 12 hours for 5 days (Patient not taking: Reported on 04/18/2021) 40 capsule 0   phentermine 15 MG capsule Take 1 capsule (15 mg total) by mouth daily after breakfast. (Patient not taking: Reported on 04/18/2021) 30 capsule 2   predniSONE (DELTASONE) 10 MG tablet Take 6 tablets on day 1 , then reduce by 1 tablet daily until gone (6-5-4-3-2-1) (Patient not taking: Reported on 04/18/2021) 21 tablet 0   predniSONE (STERAPRED UNI-PAK 21 TAB) 10 MG (21) TBPK tablet Use as directed within package instructions (Patient not taking: Reported on 04/18/2021) 21 tablet 0   Vitamin D, Ergocalciferol, (DRISDOL) 1.25 MG (50000 UNIT) CAPS capsule Take 1 capsule by mouth every 7 days. (Patient not taking: Reported on 04/18/2021) 4 capsule 0   No facility-administered medications prior to visit.    Review of Systems;  Patient denies headache, fevers, malaise, unintentional weight loss, skin rash, eye pain, sinus congestion and sinus pain, sore throat, dysphagia,  hemoptysis , cough, dyspnea, wheezing, chest pain, palpitations, orthopnea, edema, abdominal pain, nausea, melena, diarrhea, constipation, flank pain, dysuria, hematuria, urinary  Frequency, nocturia, numbness, tingling, seizures,  Focal weakness, Loss of consciousness,  Tremor, insomnia, depression, anxiety, and suicidal ideation.      Objective:  BP 100/68 (BP Location: Left Arm, Patient Position: Sitting, Cuff Size: Large)    Pulse 74    Temp 98.4 F (36.9 C) (Oral)    Resp 16    Ht 5' 3"  (1.6 m)    Wt 201 lb 9.6 oz (91.4 kg)    SpO2 97%    BMI 35.71 kg/m   BP Readings from Last 3 Encounters:  04/18/21 100/68  11/13/20 118/74  10/16/20 130/85    Wt Readings from Last 3 Encounters:  04/18/21 201 lb 9.6 oz (91.4 kg)  11/13/20 209 lb 3.2 oz (94.9 kg)  10/16/20 204 lb (92.5 kg)    General appearance: alert, cooperative and appears stated  age Ears: normal TM's and external ear canals both ears Throat: lips, mucosa, and tongue normal; teeth and gums normal Neck: no adenopathy, no carotid bruit, supple, symmetrical, trachea midline and thyroid not enlarged, symmetric, no tenderness/mass/nodules Back: symmetric, no curvature. ROM normal. No CVA tenderness. Lungs: clear to auscultation bilaterally Heart: regular rate and rhythm, S1, S2 normal, no murmur, click, rub or gallop Abdomen: soft, non-tender; bowel sounds normal; no masses,  no organomegaly Pulses: 2+ and symmetric Skin: Skin color, texture, turgor normal. No rashes or lesions Lymph nodes: Cervical, supraclavicular, and axillary nodes normal.  Lab Results  Component Value Date   HGBA1C 5.8 11/13/2020   HGBA1C 6.0 (H) 05/08/2020   HGBA1C 5.9 (H) 11/08/2019    Lab Results  Component Value Date   CREATININE 0.83 11/13/2020   CREATININE 0.83 05/08/2020   CREATININE 0.77 11/08/2019  Lab Results  Component Value Date   WBC 4.3 06/18/2020   HGB 14.4 06/18/2020   HCT 43.8 06/18/2020   PLT 236 06/18/2020   GLUCOSE 93 11/13/2020   CHOL 190 05/08/2020   TRIG 98 05/08/2020   HDL 63 05/08/2020   LDLCALC 109 (H) 05/08/2020   ALT 33 11/13/2020   AST 27 11/13/2020   NA 138 11/13/2020   K 4.3 11/13/2020   CL 103 11/13/2020   CREATININE 0.83 11/13/2020   BUN 12 11/13/2020   CO2 30 11/13/2020   TSH 0.537 05/08/2020   HGBA1C 5.8 11/13/2020    MM 3D SCREEN BREAST BILATERAL  Result Date: 02/24/2021 CLINICAL DATA:  Screening. EXAM: DIGITAL SCREENING BILATERAL MAMMOGRAM WITH TOMOSYNTHESIS AND CAD TECHNIQUE: Bilateral screening digital craniocaudal and mediolateral oblique mammograms were obtained. Bilateral screening digital breast tomosynthesis was performed. The images were evaluated with computer-aided detection. COMPARISON:  Previous exam(s). ACR Breast Density Category b: There are scattered areas of fibroglandular density. FINDINGS: There are no findings  suspicious for malignancy. IMPRESSION: No mammographic evidence of malignancy. A result letter of this screening mammogram will be mailed directly to the patient. RECOMMENDATION: Screening mammogram in one year. (Code:SM-B-01Y) BI-RADS CATEGORY  1: Negative. Electronically Signed   By: Audie Pinto M.D.   On: 02/24/2021 07:47    Assessment & Plan:   Problem List Items Addressed This Visit     Dyspnea on exertion    Post COVID infection.  Referring to cardiology given normal lung exam       Relevant Orders   Ambulatory referral to Cardiology   Sensation of chest tightness - Primary    I have ordered and reviewed a 12 lead EKG and find that there are no acute changes and patient is in sinus rhythm.    Chest x ray ordered,  D Dimer and cardiac enzymes.        Relevant Orders   EKG 12-Lead (Completed)   DG Chest 2 View   CBC with Differential/Platelet   Comprehensive metabolic panel   Ambulatory referral to Cardiology   D-Dimer, Quantitative   CK total and CKMB (cardiac)not at [facility]   Troponin I (High Sensitivity)    I spent 30 minutes dedicated to the care of this patient on the date of this encounter to include pre-visit review of patient's medical history,  most recent imaging studies, Face-to-face time with the patient , and post visit ordering of testing and therapeutics.    Follow-up: No follow-ups on file.   Crecencio Mc, MD

## 2021-04-21 ENCOUNTER — Other Ambulatory Visit (HOSPITAL_COMMUNITY): Payer: Self-pay

## 2021-04-21 ENCOUNTER — Other Ambulatory Visit: Payer: Self-pay | Admitting: Internal Medicine

## 2021-04-21 ENCOUNTER — Encounter: Payer: Self-pay | Admitting: Internal Medicine

## 2021-04-21 DIAGNOSIS — R7401 Elevation of levels of liver transaminase levels: Secondary | ICD-10-CM

## 2021-05-05 ENCOUNTER — Other Ambulatory Visit: Payer: Self-pay | Admitting: Internal Medicine

## 2021-05-05 ENCOUNTER — Other Ambulatory Visit (HOSPITAL_COMMUNITY): Payer: Self-pay

## 2021-05-05 DIAGNOSIS — R7303 Prediabetes: Secondary | ICD-10-CM

## 2021-05-05 MED ORDER — OZEMPIC (0.25 OR 0.5 MG/DOSE) 2 MG/3ML ~~LOC~~ SOPN
PEN_INJECTOR | SUBCUTANEOUS | 0 refills | Status: DC
  Filled 2021-05-05: qty 3, 28d supply, fill #0

## 2021-05-13 ENCOUNTER — Other Ambulatory Visit: Payer: Self-pay | Admitting: Internal Medicine

## 2021-05-13 ENCOUNTER — Other Ambulatory Visit (HOSPITAL_COMMUNITY): Payer: Self-pay

## 2021-05-13 ENCOUNTER — Encounter: Payer: Self-pay | Admitting: Internal Medicine

## 2021-05-13 MED ORDER — PHENTERMINE HCL 15 MG PO CAPS
15.0000 mg | ORAL_CAPSULE | Freq: Every day | ORAL | 2 refills | Status: DC
  Filled 2021-05-13: qty 30, 30d supply, fill #0

## 2021-05-20 ENCOUNTER — Other Ambulatory Visit: Payer: Self-pay

## 2021-05-20 DIAGNOSIS — E785 Hyperlipidemia, unspecified: Secondary | ICD-10-CM

## 2021-05-20 DIAGNOSIS — R7401 Elevation of levels of liver transaminase levels: Secondary | ICD-10-CM

## 2021-05-20 DIAGNOSIS — E034 Atrophy of thyroid (acquired): Secondary | ICD-10-CM

## 2021-05-29 ENCOUNTER — Other Ambulatory Visit (HOSPITAL_COMMUNITY): Payer: Self-pay

## 2021-05-29 ENCOUNTER — Telehealth: Payer: Self-pay | Admitting: Internal Medicine

## 2021-05-29 DIAGNOSIS — E785 Hyperlipidemia, unspecified: Secondary | ICD-10-CM

## 2021-05-29 LAB — HEPATIC FUNCTION PANEL
ALT: 25 IU/L (ref 0–32)
AST: 30 IU/L (ref 0–40)
Albumin: 4.6 g/dL (ref 3.8–4.8)
Alkaline Phosphatase: 88 IU/L (ref 44–121)
Bilirubin Total: 0.4 mg/dL (ref 0.0–1.2)
Bilirubin, Direct: 0.15 mg/dL (ref 0.00–0.40)
Total Protein: 6.5 g/dL (ref 6.0–8.5)

## 2021-05-29 LAB — TSH: TSH: 0.517 u[IU]/mL (ref 0.450–4.500)

## 2021-05-29 MED ORDER — LEVOTHYROXINE SODIUM 25 MCG PO TABS
25.0000 ug | ORAL_TABLET | Freq: Every day | ORAL | 0 refills | Status: DC
  Filled 2021-05-29 – 2021-12-15 (×2): qty 90, 90d supply, fill #0

## 2021-05-29 NOTE — Telephone Encounter (Signed)
Lab has been reordered.  ?

## 2021-05-29 NOTE — Addendum Note (Signed)
Addended by: Crecencio Mc on: 05/29/2021 04:16 PM ? ? Modules accepted: Orders ? ?

## 2021-05-29 NOTE — Addendum Note (Signed)
Addended by: Adair Laundry on: 05/29/2021 04:18 PM ? ? Modules accepted: Orders ? ?

## 2021-05-29 NOTE — Assessment & Plan Note (Signed)
Thyroid function is WNL on current dose o f levothyroxine 25 mcg daily .  Continue current dose ? ?Lab Results  ?Component Value Date  ? TSH 0.517 05/28/2021  ? ? ?

## 2021-05-29 NOTE — Telephone Encounter (Signed)
[name] from [facility] called. She was unable to draw lipid panel when patient was at lab, patient was not fasting. [facility] is requesting another order to draw lipid panel. ?

## 2021-05-29 NOTE — Telephone Encounter (Signed)
Sacha calling from [facility] about previous message ?

## 2021-05-30 LAB — LIPID PANEL
Chol/HDL Ratio: 2.8 ratio (ref 0.0–4.4)
Cholesterol, Total: 175 mg/dL (ref 100–199)
HDL: 62 mg/dL (ref >39)
LDL Chol Calc (NIH): 98 mg/dL (ref 0–99)
Triglycerides: 78 mg/dL (ref 0–149)
VLDL Cholesterol Cal: 15 mg/dL (ref 5–40)

## 2021-05-31 ENCOUNTER — Other Ambulatory Visit: Payer: Self-pay | Admitting: Internal Medicine

## 2021-06-02 ENCOUNTER — Encounter: Payer: Self-pay | Admitting: Internal Medicine

## 2021-06-02 ENCOUNTER — Other Ambulatory Visit (HOSPITAL_COMMUNITY): Payer: Self-pay

## 2021-06-06 ENCOUNTER — Other Ambulatory Visit (HOSPITAL_COMMUNITY): Payer: Self-pay

## 2021-06-06 ENCOUNTER — Encounter: Payer: Self-pay | Admitting: Internal Medicine

## 2021-06-06 ENCOUNTER — Other Ambulatory Visit: Payer: Self-pay | Admitting: Internal Medicine

## 2021-06-06 NOTE — Telephone Encounter (Signed)
Phentermine is not in pt's current medication list.  ? ?Refilled: 05/13/2021 ?Last OV: 11/13/2020 ?Next OV: 11/13/2021 ?

## 2021-06-09 ENCOUNTER — Other Ambulatory Visit (HOSPITAL_COMMUNITY): Payer: Self-pay

## 2021-06-09 MED ORDER — PHENTERMINE HCL 15 MG PO CAPS
15.0000 mg | ORAL_CAPSULE | Freq: Every day | ORAL | 2 refills | Status: DC
  Filled 2021-06-09 – 2021-06-10 (×2): qty 30, 30d supply, fill #0
  Filled 2021-08-05: qty 30, 30d supply, fill #1
  Filled 2021-09-15: qty 21, 21d supply, fill #2
  Filled 2021-09-15: qty 9, 9d supply, fill #2

## 2021-06-09 NOTE — Telephone Encounter (Signed)
Refilled: 05/13/2021 ?Last OV: 11/13/2020 ?Next OV: 11/13/2021 ?

## 2021-06-10 ENCOUNTER — Other Ambulatory Visit (HOSPITAL_COMMUNITY): Payer: Self-pay

## 2021-06-16 ENCOUNTER — Other Ambulatory Visit (HOSPITAL_COMMUNITY): Payer: Self-pay

## 2021-06-16 ENCOUNTER — Telehealth: Payer: No Typology Code available for payment source | Admitting: Physician Assistant

## 2021-06-16 DIAGNOSIS — H8112 Benign paroxysmal vertigo, left ear: Secondary | ICD-10-CM | POA: Diagnosis not present

## 2021-06-16 MED ORDER — MECLIZINE HCL 25 MG PO TABS
25.0000 mg | ORAL_TABLET | Freq: Three times a day (TID) | ORAL | 0 refills | Status: DC | PRN
  Filled 2021-06-16: qty 30, 10d supply, fill #0

## 2021-06-16 NOTE — Patient Instructions (Signed)
?[facility], thank you for joining Mar Daring, PA-C for today's virtual visit.  While this provider is not your primary care provider (PCP), if your PCP is located in our provider database this encounter information will be shared with them immediately following your visit. ? ?Consent: ?(Patient) Samantha Raymond [facility] provided verbal consent for this virtual visit at the beginning of the encounter. ? ?Current Medications: ? ?Current Outpatient Medications:  ?  meclizine (ANTIVERT) 25 MG tablet, Take 1 tablet (25 mg total) by mouth 3 (three) times daily as needed for dizziness., Disp: 30 tablet, Rfl: 0 ?  albuterol (PROVENTIL) (2.5 MG/3ML) 0.083% nebulizer solution, Take 3 mLs (2.5 mg total) by nebulization every 6 (six) hours as needed for wheezing or shortness of breath., Disp: 45 mL, Rfl: 1 ?  albuterol (VENTOLIN HFA) 108 (90 Base) MCG/ACT inhaler, Inhale 2 puffs into the lungs every 6 (six) hours as needed for wheezing or shortness of breath., Disp: 18 g, Rfl: 3 ?  buPROPion ER (WELLBUTRIN SR) 100 MG 12 hr tablet, Take 1 tablet by mouth twice daily, Disp: 60 tablet, Rfl: 0 ?  cetirizine (ZYRTEC) 10 MG tablet, Take 10 mg by mouth daily., Disp: , Rfl:  ?  escitalopram (LEXAPRO) 10 MG tablet, TAKE 1 TABLET BY MOUTH ONCE DAILY, Disp: 90 tablet, Rfl: 1 ?  fluticasone (FLONASE) 50 MCG/ACT nasal spray, Place into both nostrils daily., Disp: , Rfl:  ?  fluticasone-salmeterol (ADVAIR) 500-50 MCG/ACT AEPB, INHALE 1 PUFF 2 TIMES DAILY INTO THE LUNGS. RINSE MOUTH AFTER EACH USE TO PREVENT THRUSH, Disp: 60 each, Rfl: 5 ?  guaiFENesin-codeine (ROBITUSSIN AC) 100-10 MG/5ML syrup, Take 5 mLs by mouth 3 (three) times daily as needed for cough., Disp: 120 mL, Rfl: 0 ?  levothyroxine (SYNTHROID) 25 MCG tablet, TAKE 1 TABLET BY MOUTH DAILY BEFORE BREAKFAST, Disp: 90 tablet, Rfl: 0 ?  ondansetron (ZOFRAN ODT) 4 MG disintegrating tablet, Take 1 tablet (4 mg total) by mouth every 8 (eight) hours as needed for  nausea., Disp: 20 tablet, Rfl: 0 ?  phentermine 15 MG capsule, Take 1 capsule by mouth daily after breakfast., Disp: 30 capsule, Rfl: 2 ?  Semaglutide,0.25 or 0.'5MG'$ /DOS, (OZEMPIC, 0.25 OR 0.5 MG/DOSE,) 2 MG/3ML SOPN, Inject 0.5 mg under the skin once a week, Disp: 3 mL, Rfl: 0  ? ?Medications ordered in this encounter:  ?Meds ordered this encounter  ?Medications  ? meclizine (ANTIVERT) 25 MG tablet  ?  Sig: Take 1 tablet (25 mg total) by mouth 3 (three) times daily as needed for dizziness.  ?  Dispense:  30 tablet  ?  Refill:  0  ?  Order Specific Question:   Supervising Provider  ?  Answer:   Noemi Chapel [[id number]]  ?  ? ?*If you need refills on other medications prior to your next appointment, please contact your pharmacy* ? ?Follow-Up: ?Call back or seek an in-person evaluation if the symptoms worsen or if the condition fails to improve as anticipated. ? ?Other Instructions ?Benign Positional Vertigo ?Vertigo is the feeling that you or your surroundings are moving when they are not. Benign positional vertigo is the most common form of vertigo. This is usually a harmless condition (benign). This condition is positional. This means that symptoms are triggered by certain movements and positions. ?This condition can be dangerous if it occurs while you are doing something that could cause harm to yourself or others. This includes activities such as driving or operating machinery. ?What are the causes? ?The  inner ear has fluid-filled canals that help your brain sense movement and balance. When the fluid moves, the brain receives messages about your body's position. ?With benign positional vertigo, calcium crystals in the inner ear break free and disturb the inner ear area. This causes your brain to receive confusing messages about your body's position. ?What increases the risk? ?You are more likely to develop this condition if: ?You are a woman. ?You are 71 years of age or older. ?You have recently had a head  injury. ?You have an inner ear disease. ?What are the signs or symptoms? ?Symptoms of this condition usually happen when you move your head or your eyes in different directions. Symptoms may start suddenly and usually last for less than a minute. They include: ?Loss of balance and falling. ?Feeling like you are spinning or moving. ?Feeling like your surroundings are spinning or moving. ?Nausea and vomiting. ?Blurred vision. ?Dizziness. ?Involuntary eye movement (nystagmus). ?Symptoms can be mild and cause only minor problems, or they can be severe and interfere with daily life. Episodes of benign positional vertigo may return (recur) over time. Symptoms may also improve over time. ?How is this diagnosed? ?This condition may be diagnosed based on: ?Your medical history. ?A physical exam of the head, neck, and ears. ?Positional tests to check for or stimulate vertigo. You may be asked to turn your head and change positions, such as going from sitting to lying down. A health care provider will watch for symptoms of vertigo. ?You may be referred to a health care provider who specializes in ear, nose, and throat problems (ENT or otolaryngologist) or a provider who specializes in disorders of the nervous system (neurologist). ?How is this treated? ? ?This condition may be treated in a session in which your health care provider moves your head in specific positions to help the displaced crystals in your inner ear move. Treatment for this condition may take several sessions. Surgery may be needed in severe cases, but this is rare. ?In some cases, benign positional vertigo may resolve on its own in 2-4 weeks. ?Follow these instructions at home: ?Safety ?Move slowly. Avoid sudden body or head movements or certain positions, as told by your health care provider. ?Avoid driving or operating machinery until your health care provider says it is safe. ?Avoid doing any tasks that would be dangerous to you or others if vertigo  occurs. ?If you have trouble walking or keeping your balance, try using a cane for stability. If you feel dizzy or unstable, sit down right away. ?Return to your normal activities as told by your health care provider. Ask your health care provider what activities are safe for you. ?General instructions ?Take over-the-counter and prescription medicines only as told by your health care provider. ?Drink enough fluid to keep your urine pale yellow. ?Keep all follow-up visits. This is important. ?Contact a health care provider if: ?You have a fever. ?Your condition gets worse or you develop new symptoms. ?Your family or friends notice any behavioral changes. ?You have nausea or vomiting that gets worse. ?You have numbness or a prickling and tingling sensation. ?Get help right away if you: ?Have difficulty speaking or moving. ?Are always dizzy or faint. ?Develop severe headaches. ?Have weakness in your legs or arms. ?Have changes in your hearing or vision. ?Develop a stiff neck. ?Develop sensitivity to light. ?These symptoms may represent a serious problem that is an emergency. Do not wait to see if the symptoms will go away. Get medical help right  away. Call your local emergency services (911 in the [place] Do not drive yourself to the hospital. ?Summary ?Vertigo is the feeling that you or your surroundings are moving when they are not. Benign positional vertigo is the most common form of vertigo. ?This condition is caused by calcium crystals in the inner ear that become displaced. This causes a disturbance in an area of the inner ear that helps your brain sense movement and balance. ?Symptoms include loss of balance and falling, feeling that you or your surroundings are moving, nausea and vomiting, and blurred vision. ?This condition can be diagnosed based on symptoms, a physical exam, and positional tests. ?Follow safety instructions as told by your health care provider and keep all follow-up visits. This is  important. ?This information is not intended to replace advice given to you by your health care provider. Make sure you discuss any questions you have with your health care provider. ?Document Revised: 01/03/2020 Document Revi

## 2021-06-16 NOTE — Progress Notes (Signed)
?Virtual Visit Consent  ? ?Samantha Raymond, you are scheduled for a virtual visit with a [facility] provider today.   ?  ?Just as with appointments in the office, your consent must be obtained to participate.  Your consent will be active for this visit and any virtual visit you may have with one of our providers in the next 365 days.   ?  ?If you have a MyChart account, a copy of this consent can be sent to you electronically.  All virtual visits are billed to your insurance company just like a traditional visit in the office.   ? ?As this is a virtual visit, video technology does not allow for your provider to perform a traditional examination.  This may limit your provider's ability to fully assess your condition.  If your provider identifies any concerns that need to be evaluated in person or the need to arrange testing (such as labs, EKG, etc.), we will make arrangements to do so.   ?  ?Although advances in technology are sophisticated, we cannot ensure that it will always work on either your end or our end.  If the connection with a video visit is poor, the visit may have to be switched to a telephone visit.  With either a video or telephone visit, we are not always able to ensure that we have a secure connection.    ? ?Also, by engaging in this virtual visit, you consent to the provision of healthcare. Additionally, you authorize for your insurance to be billed (if applicable) for the services provided during this visit.  ? ?I need to obtain your verbal consent now.   Are you willing to proceed with your visit today?  ?  ?Samantha Raymond has provided verbal consent on 06/16/2021 for a virtual visit (video or telephone). ?  ?Mar Daring, PA-C  ? ?Date: 06/16/2021 11:51 AM ? ? ?Virtual Visit via Video Note  ? ?IMar Daring, connected with  Samantha Raymond  ([id number], [birth year]) on 06/16/21 at 11:30 AM EDT by a video-enabled telemedicine application and verified that I  am speaking with the correct person using two identifiers. ? ?Location: ?Patient: Virtual Visit Location Patient: Other: work, isolated ?Provider: Virtual Visit Location Provider: Home Office ?  ?I discussed the limitations of evaluation and management by telemedicine and the availability of in person appointments. The patient expressed understanding and agreed to proceed.   ? ?History of Present Illness: ?Samantha Raymond is a 61 y.o. who identifies as a female who was assigned female at birth, and is being seen today for vertigo. ? ?HPI: Dizziness ?This is a recurrent problem. The current episode started in the past 7 days (started taking pilates and noticed symptoms worsening over the weekend). The problem occurs intermittently. The problem has been unchanged. Associated symptoms include headaches, vertigo and vomiting (once). Pertinent negatives include no chills, congestion, nausea or sore throat. Associated symptoms comments: Tinnitus in both ears. The symptoms are aggravated by bending (head movements). Treatments tried: meclizine chewable OTC, epley maneuver. The treatment provided no relief.  Turning head to left makes worse.  ? ? ?Problems:  ?Patient Active Problem List  ? Diagnosis Date Noted  ? Sensation of chest tightness 04/18/2021  ? At risk for malnutrition 07/17/2020  ? Vitamin D deficiency 07/02/2020  ? B12 deficiency 07/02/2020  ? Other fatigue 06/18/2020  ? Dyspnea on exertion 06/18/2020  ? Vitamin B 12 deficiency 06/18/2020  ? Depressed mood, with emotional eating  06/18/2020  ? At risk for impaired metabolic function 83/38/2505  ? 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 Stress and adjustment reaction 11/09/2019  ? Vertigo, aural, left 12/07/2018  ? Reactive airways dysfunction syndrome, moderate persistent, uncomplicated ([place]) 39/76/7341  ? Prediabetes 10/30/2017  ? Family history of breast cancer in first degree relative 10/08/2014  ? Fatty liver disease, nonalcoholic 93/79/0240  ? Visit for  preventive health examination 08/20/2012  ? Menopause 08/17/2012  ? Obesity (BMI 30-39.9) 08/09/2011  ? Hypothyroidism 08/09/2011  ?  ?Allergies:  ?Allergies  ?Allergen Reactions  ? Sulfa Antibiotics Nausea And Vomiting  ? ?Medications:  ?Current Outpatient Medications:  ?  meclizine (ANTIVERT) 25 MG tablet, Take 1 tablet (25 mg total) by mouth 3 (three) times daily as needed for dizziness., Disp: 30 tablet, Rfl: 0 ?  albuterol (PROVENTIL) (2.5 MG/3ML) 0.083% nebulizer solution, Take 3 mLs (2.5 mg total) by nebulization every 6 (six) hours as needed for wheezing or shortness of breath., Disp: 45 mL, Rfl: 1 ?  albuterol (VENTOLIN HFA) 108 (90 Base) MCG/ACT inhaler, Inhale 2 puffs into the lungs every 6 (six) hours as needed for wheezing or shortness of breath., Disp: 18 g, Rfl: 3 ?  buPROPion ER (WELLBUTRIN SR) 100 MG 12 hr tablet, Take 1 tablet by mouth twice daily, Disp: 60 tablet, Rfl: 0 ?  cetirizine (ZYRTEC) 10 MG tablet, Take 10 mg by mouth daily., Disp: , Rfl:  ?  escitalopram (LEXAPRO) 10 MG tablet, TAKE 1 TABLET BY MOUTH ONCE DAILY, Disp: 90 tablet, Rfl: 1 ?  fluticasone (FLONASE) 50 MCG/ACT nasal spray, Place into both nostrils daily., Disp: , Rfl:  ?  fluticasone-salmeterol (ADVAIR) 500-50 MCG/ACT AEPB, INHALE 1 PUFF 2 TIMES DAILY INTO THE LUNGS. RINSE MOUTH AFTER EACH USE TO PREVENT THRUSH, Disp: 60 each, Rfl: 5 ?  guaiFENesin-codeine (ROBITUSSIN AC) 100-10 MG/5ML syrup, Take 5 mLs by mouth 3 (three) times daily as needed for cough., Disp: 120 mL, Rfl: 0 ?  levothyroxine (SYNTHROID) 25 MCG tablet, TAKE 1 TABLET BY MOUTH DAILY BEFORE BREAKFAST, Disp: 90 tablet, Rfl: 0 ?  ondansetron (ZOFRAN ODT) 4 MG disintegrating tablet, Take 1 tablet (4 mg total) by mouth every 8 (eight) hours as needed for nausea., Disp: 20 tablet, Rfl: 0 ?  phentermine 15 MG capsule, Take 1 capsule by mouth daily after breakfast., Disp: 30 capsule, Rfl: 2 ?  Semaglutide,0.25 or 0.'5MG'$ /DOS, (OZEMPIC, 0.25 OR 0.5 MG/DOSE,) 2 MG/3ML SOPN,  Inject 0.5 mg under the skin once a week, Disp: 3 mL, Rfl: 0 ? ?Observations/Objective: ?Patient is well-developed, well-nourished in no acute distress.  ?Resting comfortably  ?Head is normocephalic, atraumatic.  ?No labored breathing.  ?Speech is clear and coherent with logical content.  ?Patient is alert and oriented at baseline.  ? ? ?Assessment and Plan: ?1. BPPV (benign paroxysmal positional vertigo), left ?- meclizine (ANTIVERT) 25 MG tablet; Take 1 tablet (25 mg total) by mouth 3 (three) times daily as needed for dizziness.  Dispense: 30 tablet; Refill: 0 ? ?- Suspect BPPV, left ?- Meclizine prescribed ?- Try adding sudafed '10mg'$  and Fluticasone OTC ?- Epley maneuver and Laruth Bouchard- Daroff exercises provided via AVS ?- Seek in person evaluation if symptoms worsen or fail to improve ? ?Follow Up Instructions: ?I discussed the assessment and treatment plan with the patient. The patient was provided an opportunity to ask questions and all were answered. The patient agreed with the plan and demonstrated an understanding of the instructions.  A copy of instructions were sent to the patient  via MyChart unless otherwise noted below.  ? ? ?The patient was advised to call back or seek an in-person evaluation if the symptoms worsen or if the condition fails to improve as anticipated. ? ?Time:  ?I spent 18 minutes with the patient via telehealth technology discussing the above problems/concerns.   ? ?Mar Daring, PA-C ?

## 2021-06-18 ENCOUNTER — Ambulatory Visit: Payer: No Typology Code available for payment source | Admitting: Internal Medicine

## 2021-06-18 ENCOUNTER — Other Ambulatory Visit (HOSPITAL_COMMUNITY): Payer: Self-pay

## 2021-06-18 ENCOUNTER — Encounter: Payer: Self-pay | Admitting: Internal Medicine

## 2021-06-18 ENCOUNTER — Telehealth: Payer: Self-pay | Admitting: Internal Medicine

## 2021-06-18 VITALS — BP 128/78 | HR 76 | Temp 97.9°F | Ht 64.0 in | Wt 198.4 lb

## 2021-06-18 DIAGNOSIS — H9313 Tinnitus, bilateral: Secondary | ICD-10-CM | POA: Diagnosis not present

## 2021-06-18 DIAGNOSIS — H81312 Aural vertigo, left ear: Secondary | ICD-10-CM

## 2021-06-18 DIAGNOSIS — R42 Dizziness and giddiness: Secondary | ICD-10-CM | POA: Diagnosis not present

## 2021-06-18 DIAGNOSIS — R110 Nausea: Secondary | ICD-10-CM

## 2021-06-18 DIAGNOSIS — R519 Headache, unspecified: Secondary | ICD-10-CM

## 2021-06-18 MED ORDER — DIAZEPAM 5 MG PO TABS
5.0000 mg | ORAL_TABLET | Freq: Three times a day (TID) | ORAL | 0 refills | Status: DC | PRN
  Filled 2021-06-18: qty 30, 10d supply, fill #0

## 2021-06-18 MED ORDER — PREDNISONE 10 MG PO TABS
ORAL_TABLET | ORAL | 0 refills | Status: DC
  Filled 2021-06-18: qty 21, 6d supply, fill #0

## 2021-06-18 MED ORDER — ONDANSETRON HCL 4 MG PO TABS
4.0000 mg | ORAL_TABLET | Freq: Three times a day (TID) | ORAL | 0 refills | Status: AC | PRN
  Filled 2021-06-18: qty 20, 7d supply, fill #0

## 2021-06-18 NOTE — Progress Notes (Signed)
? ?Subjective:  ?Patient ID: Samantha Raymond, female    DOB: 06/26/1960  Age: 61 y.o. MRN: [id number] ? ?CC: The primary encounter diagnosis was Nausea. Diagnoses of Tinnitus aurium, bilateral, Occipital headache, Vertigo, and Vertigo, aural, left were also pertinent to this visit. ? ? ?This visit occurred during the SARS-CoV-2 public health emergency.  Safety protocols were in place, including screening questions prior to the visit, additional usage of staff PPE, and extensive cleaning of exam room while observing appropriate contact time as indicated for disinfecting solutions.   ? ?HPI ?Samantha Raymond presents for  ?Chief Complaint  ?Patient presents with  ? Dizziness  ?  Vertigo.  ? ?Vertigo since last Friday.  Symptoms Started in Pilates class and were aggravated by sudden movement, , but now accompanied by an occipital headache with tinnitus and nausea/vomiting .  Uses flonase for allergic rhinitis, denies fevers,  purulent sinus drainage,  vision changes.  ? ? ? ? ? ?Outpatient Medications Prior to Visit  ?Medication Sig Dispense Refill  ? albuterol (PROVENTIL) (2.5 MG/3ML) 0.083% nebulizer solution Take 3 mLs (2.5 mg total) by nebulization every 6 (six) hours as needed for wheezing or shortness of breath. 45 mL 1  ? albuterol (VENTOLIN HFA) 108 (90 Base) MCG/ACT inhaler Inhale 2 puffs into the lungs every 6 (six) hours as needed for wheezing or shortness of breath. 18 g 3  ? buPROPion ER (WELLBUTRIN SR) 100 MG 12 hr tablet Take 1 tablet by mouth twice daily 60 tablet 0  ? cetirizine (ZYRTEC) 10 MG tablet Take 10 mg by mouth daily.    ? escitalopram (LEXAPRO) 10 MG tablet TAKE 1 TABLET BY MOUTH ONCE DAILY 90 tablet 1  ? fluticasone (FLONASE) 50 MCG/ACT nasal spray Place into both nostrils daily.    ? fluticasone-salmeterol (ADVAIR) 500-50 MCG/ACT AEPB INHALE 1 PUFF 2 TIMES DAILY INTO THE LUNGS. RINSE MOUTH AFTER EACH USE TO PREVENT THRUSH 60 each 5  ? guaiFENesin-codeine (ROBITUSSIN AC)  100-10 MG/5ML syrup Take 5 mLs by mouth 3 (three) times daily as needed for cough. 120 mL 0  ? levothyroxine (SYNTHROID) 25 MCG tablet TAKE 1 TABLET BY MOUTH DAILY BEFORE BREAKFAST 90 tablet 0  ? meclizine (ANTIVERT) 25 MG tablet Take 1 tablet  by mouth 3 times daily as needed for dizziness. 30 tablet 0  ? phentermine 15 MG capsule Take 1 capsule by mouth daily after breakfast. 30 capsule 2  ? Semaglutide,0.25 or 0.'5MG'$ /DOS, (OZEMPIC, 0.25 OR 0.5 MG/DOSE,) 2 MG/3ML SOPN Inject 0.5 mg under the skin once a week 3 mL 0  ? ondansetron (ZOFRAN ODT) 4 MG disintegrating tablet Take 1 tablet (4 mg total) by mouth every 8 (eight) hours as needed for nausea. 20 tablet 0  ? ?No facility-administered medications prior to visit.  ? ? ?Review of Systems; ? ?Patient denies headache, fevers, malaise, unintentional weight loss, skin rash, eye pain, sinus congestion and sinus pain, sore throat, dysphagia,  hemoptysis , cough, dyspnea, wheezing, chest pain, palpitations, orthopnea, edema, abdominal pain, nausea, melena, diarrhea, constipation, flank pain, dysuria, hematuria, urinary  Frequency, nocturia, numbness, tingling, seizures,  Focal weakness, Loss of consciousness,  Tremor, insomnia, depression, anxiety, and suicidal ideation.   ? ? ? ?Objective:  ?BP 128/78 (BP Location: Left Arm, Patient Position: Sitting, Cuff Size: Normal)   Pulse 76   Temp 97.9 ?F (36.6 ?C) (Oral)   Ht '5\' 4"'$  (1.626 m)   Wt 198 lb 6.4 oz (90 kg)   SpO2 96%  BMI 34.06 kg/m?  ? ?BP Readings from Last 3 Encounters:  ?06/18/21 128/78  ?04/18/21 100/68  ?11/13/20 118/74  ? ? ?Wt Readings from Last 3 Encounters:  ?06/18/21 198 lb 6.4 oz (90 kg)  ?04/18/21 201 lb 9.6 oz (91.4 kg)  ?11/13/20 209 lb 3.2 oz (94.9 kg)  ? ? ?General appearance: alert, cooperative and appears stated age ?Ears: normal TM's and external ear canals both ears ?Throat: lips, mucosa, and tongue normal; teeth and gums normal ?Neck: no adenopathy, no carotid bruit, supple, symmetrical,  trachea midline and thyroid not enlarged, symmetric, no tenderness/mass/nodules ?Back: symmetric, no curvature. ROM normal. No CVA tenderness. ?Lungs: clear to auscultation bilaterally ?Heart: regular rate and rhythm, S1, S2 normal, no murmur, click, rub or gallop ?Abdomen: soft, non-tender; bowel sounds normal; no masses,  no organomegaly ?Pulses: 2+ and symmetric ?Skin: Skin color, texture, turgor normal. No rashes or lesions ?Lymph nodes: Cervical, supraclavicular, and axillary nodes normal. ?Neuro:  awake and interactive with normal mood and affect. Higher cortical functions are normal. Speech is clear without word-finding difficulty or dysarthria. Extraocular movements are intact. Visual fields of both eyes are grossly intact. Sensation to light touch is grossly intact bilaterally of upper and lower extremities. Motor examination shows 4+/5 symmetric hand grip and upper extremity and 5/5 lower extremity strength. There is no pronation or drift. Gait is non-ataxic  ? ?Lab Results  ?Component Value Date  ? HGBA1C 5.8 11/13/2020  ? HGBA1C 6.0 (H) 05/08/2020  ? HGBA1C 5.9 (H) 11/08/2019  ? ? ?Lab Results  ?Component Value Date  ? CREATININE 0.84 04/18/2021  ? CREATININE 0.83 11/13/2020  ? CREATININE 0.83 05/08/2020  ? ? ?Lab Results  ?Component Value Date  ? WBC 4.3 04/18/2021  ? HGB 13.6 04/18/2021  ? HCT 40.5 04/18/2021  ? PLT 226.0 04/18/2021  ? GLUCOSE 81 04/18/2021  ? CHOL 175 05/29/2021  ? TRIG 78 05/29/2021  ? HDL 62 05/29/2021  ? [place] 98 05/29/2021  ? ALT 25 05/28/2021  ? AST 30 05/28/2021  ? NA 138 04/18/2021  ? K 4.2 04/18/2021  ? CL 105 04/18/2021  ? CREATININE 0.84 04/18/2021  ? BUN 12 04/18/2021  ? CO2 28 04/18/2021  ? TSH 0.517 05/28/2021  ? HGBA1C 5.8 11/13/2020  ? ? ?MM 3D SCREEN BREAST BILATERAL ? ?Result Date: 02/24/2021 ?CLINICAL DATA:  Screening. EXAM: DIGITAL SCREENING BILATERAL MAMMOGRAM WITH TOMOSYNTHESIS AND CAD TECHNIQUE: Bilateral screening digital craniocaudal and mediolateral oblique  mammograms were obtained. Bilateral screening digital breast tomosynthesis was performed. The images were evaluated with computer-aided detection. COMPARISON:  Previous exam(s). ACR Breast Density Category b: There are scattered areas of fibroglandular density. FINDINGS: There are no findings suspicious for malignancy. IMPRESSION: No mammographic evidence of malignancy. A result letter of this screening mammogram will be mailed directly to the patient. RECOMMENDATION: Screening mammogram in one year. (Code:SM-B-01Y) BI-RADS CATEGORY  1: Negative. Electronically Signed   By: Audie Pinto M.D.   On: 02/24/2021 07:47  ? ? ?Assessment & Plan:  ? ?Problem List Items Addressed This Visit   ? ? Vertigo, aural, left  ?  Recurrent,  First episode in 2020.  Current episode lasting nearly one week,  Accompanied by tinnitus, nausea/vomiting and occipital headache. Prescribing  Prednisone taper, valium, and zofran.  MRI Brain with IACs ordered  To rule out vestibular schwannoma if symptoms persist ? ?  ?  ? ?Other Visit Diagnoses   ? ? Nausea    -  Primary  ? Relevant Medications  ?  ondansetron (ZOFRAN) 4 MG tablet  ? Tinnitus aurium, bilateral      ? Relevant Orders  ? MR BRAIN/IAC W WO CONTRAST  ? Occipital headache      ? Relevant Orders  ? MR BRAIN/IAC W WO CONTRAST  ? Vertigo      ? Relevant Orders  ? MR BRAIN/IAC W WO CONTRAST  ? ?  ? ? ?Follow-up: No follow-ups on file. ? ? ?Crecencio Mc, MD ?

## 2021-06-18 NOTE — Patient Instructions (Signed)
Try the valium instead of the meclizine ? ?Prednisone taper start today ? ?MR Brain ordered  ?

## 2021-06-18 NOTE — Telephone Encounter (Signed)
lft pt vm to call ofc to verify location to have the MRI done. Thank you! ?

## 2021-06-18 NOTE — Progress Notes (Signed)
Patient stated that she feels like room is flipped upside down. And seems like stuff is just jumping out at her if she moves her head a certain way. Patient stated that she was in pilates class and got dizzy and began to vomit and see flutters .  ?

## 2021-06-19 NOTE — Telephone Encounter (Signed)
Pt called because she deleted voicemail and she did not know who had called. Pt stated she wanted a location in  ?

## 2021-06-21 NOTE — Assessment & Plan Note (Addendum)
Recurrent,  First episode in 2020.  Current episode lasting nearly one week,  Accompanied by tinnitus, nausea/vomiting and occipital headache. Prescribing  Prednisone taper, valium, and zofran.  MRI Brain with IACs ordered  To rule out vestibular schwannoma if symptoms persist ?

## 2021-06-23 ENCOUNTER — Telehealth: Payer: Self-pay | Admitting: Internal Medicine

## 2021-06-23 NOTE — Telephone Encounter (Signed)
Left pt appt date time and location on vm. thanks ?

## 2021-06-27 NOTE — Telephone Encounter (Signed)
Melissa from [facility] stated there is an issue with PA for referral. The referral is for [place] but her insurance approved [place] [employer]

## 2021-07-01 ENCOUNTER — Ambulatory Visit (HOSPITAL_COMMUNITY): Payer: No Typology Code available for payment source

## 2021-07-08 ENCOUNTER — Telehealth: Payer: Self-pay | Admitting: Internal Medicine

## 2021-07-08 NOTE — Telephone Encounter (Signed)
Pt will call back to sch MRI. thanks

## 2021-07-17 ENCOUNTER — Other Ambulatory Visit (INDEPENDENT_AMBULATORY_CARE_PROVIDER_SITE_OTHER): Payer: Self-pay | Admitting: Internal Medicine

## 2021-07-17 ENCOUNTER — Other Ambulatory Visit (HOSPITAL_COMMUNITY): Payer: Self-pay

## 2021-07-17 DIAGNOSIS — F32A Depression, unspecified: Secondary | ICD-10-CM

## 2021-07-17 MED ORDER — BUPROPION HCL ER (SR) 100 MG PO TB12
ORAL_TABLET | ORAL | 1 refills | Status: DC
  Filled 2021-07-17: qty 180, 90d supply, fill #0
  Filled 2021-12-15: qty 180, 90d supply, fill #1

## 2021-07-29 ENCOUNTER — Ambulatory Visit (HOSPITAL_COMMUNITY)
Admission: RE | Admit: 2021-07-29 | Discharge: 2021-07-29 | Disposition: A | Discharge status: Home / Self Care | Payer: No Typology Code available for payment source | Source: Ambulatory Visit | Attending: Internal Medicine | Admitting: Internal Medicine

## 2021-07-29 DIAGNOSIS — R519 Headache, unspecified: Secondary | ICD-10-CM | POA: Insufficient documentation

## 2021-07-29 DIAGNOSIS — R42 Dizziness and giddiness: Secondary | ICD-10-CM | POA: Insufficient documentation

## 2021-07-29 DIAGNOSIS — H9313 Tinnitus, bilateral: Secondary | ICD-10-CM | POA: Insufficient documentation

## 2021-07-29 MED ORDER — GADOBUTROL 1 MMOL/ML IV SOLN
9.0000 mL | Freq: Once | INTRAVENOUS | Status: AC | PRN
  Administered 2021-07-29: 9 mL via INTRAVENOUS

## 2021-08-03 ENCOUNTER — Encounter: Payer: Self-pay | Admitting: Internal Medicine

## 2021-08-05 ENCOUNTER — Other Ambulatory Visit (HOSPITAL_COMMUNITY): Payer: Self-pay

## 2021-08-05 MED ORDER — AMOXICILLIN-POT CLAVULANATE 875-125 MG PO TABS
1.0000 | ORAL_TABLET | Freq: Two times a day (BID) | ORAL | 0 refills | Status: DC
  Filled 2021-08-05: qty 14, 7d supply, fill #0

## 2021-08-05 MED ORDER — PREDNISONE 10 MG PO TABS
ORAL_TABLET | ORAL | 0 refills | Status: DC
  Filled 2021-08-05: qty 21, 6d supply, fill #0

## 2021-08-12 ENCOUNTER — Other Ambulatory Visit: Payer: Self-pay | Admitting: Internal Medicine

## 2021-08-12 ENCOUNTER — Encounter: Payer: Self-pay | Admitting: Internal Medicine

## 2021-08-12 DIAGNOSIS — H9319 Tinnitus, unspecified ear: Secondary | ICD-10-CM

## 2021-09-04 ENCOUNTER — Other Ambulatory Visit: Payer: Self-pay

## 2021-09-04 MED ORDER — TRIAMCINOLONE ACETONIDE 55 MCG/ACT NA AERO: INHALATION_SPRAY | NASAL | 11 refills | Status: DC

## 2021-09-15 ENCOUNTER — Other Ambulatory Visit: Payer: Self-pay | Admitting: Internal Medicine

## 2021-09-15 ENCOUNTER — Other Ambulatory Visit (HOSPITAL_COMMUNITY): Payer: Self-pay

## 2021-09-15 MED ORDER — ESCITALOPRAM OXALATE 10 MG PO TABS
ORAL_TABLET | Freq: Every day | ORAL | 1 refills | Status: DC
  Filled 2021-09-15: qty 90, 90d supply, fill #0
  Filled 2021-12-15: qty 90, 90d supply, fill #1

## 2021-09-24 ENCOUNTER — Encounter (INDEPENDENT_AMBULATORY_CARE_PROVIDER_SITE_OTHER): Payer: Self-pay

## 2021-11-01 DIAGNOSIS — U071 COVID-19: Secondary | ICD-10-CM | POA: Diagnosis not present

## 2021-11-06 ENCOUNTER — Encounter: Payer: Self-pay | Admitting: Internal Medicine

## 2021-11-06 DIAGNOSIS — R7303 Prediabetes: Secondary | ICD-10-CM

## 2021-11-06 DIAGNOSIS — R5383 Other fatigue: Secondary | ICD-10-CM

## 2021-11-06 DIAGNOSIS — E785 Hyperlipidemia, unspecified: Secondary | ICD-10-CM

## 2021-11-06 DIAGNOSIS — K760 Fatty (change of) liver, not elsewhere classified: Secondary | ICD-10-CM

## 2021-11-06 DIAGNOSIS — E034 Atrophy of thyroid (acquired): Secondary | ICD-10-CM

## 2021-11-06 NOTE — Telephone Encounter (Signed)
I have pended labs for your approval.  

## 2021-11-11 ENCOUNTER — Telehealth (INDEPENDENT_AMBULATORY_CARE_PROVIDER_SITE_OTHER): Payer: No Typology Code available for payment source | Admitting: Family Medicine

## 2021-11-11 ENCOUNTER — Encounter: Payer: Self-pay | Admitting: Family Medicine

## 2021-11-11 ENCOUNTER — Telehealth: Payer: Self-pay | Admitting: Internal Medicine

## 2021-11-11 VITALS — Ht 64.0 in | Wt 198.0 lb

## 2021-11-11 DIAGNOSIS — J069 Acute upper respiratory infection, unspecified: Secondary | ICD-10-CM | POA: Insufficient documentation

## 2021-11-11 DIAGNOSIS — U071 COVID-19: Secondary | ICD-10-CM | POA: Diagnosis not present

## 2021-11-11 LAB — POC COVID19 BINAXNOW: SARS Coronavirus 2 Ag: NEGATIVE

## 2021-11-11 NOTE — Patient Instructions (Addendum)
It was a pleasure meeting you today. Thank you for allowing me to take part in your health care.  Our goals for today as we discussed include:  Symptomatic management for fever, muscle aches and headaches  -Tylenol 325-500 mg every 6 hours as needed -Ibuprofen 200 mg every 8 hours as needed  Stay well hydrated Rest as needed with frequent repositioning and ambulation.  Increase activity as soon as tolerated to help with recovery.  Continue wearing masks, hand washing and self isolation until symptom free.  If you have worsening symptoms, especially difficulty breathing please call 911 or have someone take you to the emergency department.    Please follow-up with PCP as needed  If you have any questions or concerns, please do not hesitate to call the office at [phone].  I look forward to our next visit and until then take care and stay safe.  Regards,   Carollee Leitz, MD   [facility]

## 2021-11-11 NOTE — Telephone Encounter (Signed)
[facility] called to ask about the results of a covid test for the pt. Informed office pt had left work for the day, suggested calling her mobile number.

## 2021-11-11 NOTE — Progress Notes (Signed)
[facility] Telemedicine Visit  Patient consented to have virtual visit and was identified by name and date of birth. Method of visit: Video  Encounter participants: Patient: Samantha Raymond - located at office at [facility] Provider: Carollee Leitz - located at office Others (if applicable): None  Chief Complaint: COVID symptoms  HPI:  Reports feeling unwell for 2 days.  Increasing nasal congestion and fatigue.  Mild shortness of breath with cough.  No dyspnea. Non productive cough.  Endorses scratchy sore throat worse with talking.  No difficulty swallowing.  Appetite ok, hydrating well.  Denies any fevers or myalgias.  Had COVID back in February.  Has received COVID vaccinations.  Had contact with recent coworkers who tested positive for COVID.    ROS: per HPI  Pertinent PMHx:  Asthma  Exam:  Ht '5\' 4"'$  (1.626 m)   Wt 198 lb (89.8 kg)   BMI 33.99 kg/m   Respiratory: Speaking in full sentences.  No increased work of breathing or cough during video visit  Assessment/Plan:  Viral URI Suspect viral infection. Cannot rule out COVID given recent exposure and now symptomatic.  In no acute respiratory distress.  Limited respiratory exam due to nature of visit. -COVID test today negative -Symptom management -Strict ED /return precautions provided -Follow up with PCP as scheduled   PDMP Reviewed  Time spent during visit with patient:20 minutes

## 2021-11-11 NOTE — Assessment & Plan Note (Addendum)
Suspect viral infection. Cannot rule out COVID given recent exposure and now symptomatic.  In no acute respiratory distress.  Limited respiratory exam due to nature of visit. -COVID test today negative -Symptom management -Strict ED /return precautions provided -Follow up with PCP as scheduled

## 2021-11-13 ENCOUNTER — Encounter: Payer: No Typology Code available for payment source | Admitting: Internal Medicine

## 2021-12-11 ENCOUNTER — Other Ambulatory Visit (INDEPENDENT_AMBULATORY_CARE_PROVIDER_SITE_OTHER): Payer: No Typology Code available for payment source

## 2021-12-11 ENCOUNTER — Other Ambulatory Visit: Payer: Self-pay | Admitting: Internal Medicine

## 2021-12-11 DIAGNOSIS — R7303 Prediabetes: Secondary | ICD-10-CM

## 2021-12-11 DIAGNOSIS — E034 Atrophy of thyroid (acquired): Secondary | ICD-10-CM | POA: Diagnosis not present

## 2021-12-11 DIAGNOSIS — E785 Hyperlipidemia, unspecified: Secondary | ICD-10-CM

## 2021-12-11 DIAGNOSIS — R5383 Other fatigue: Secondary | ICD-10-CM | POA: Diagnosis not present

## 2021-12-11 LAB — COMPREHENSIVE METABOLIC PANEL
ALT: 40 U/L — ABNORMAL HIGH (ref 0–35)
AST: 32 U/L (ref 0–37)
Albumin: 4.7 g/dL (ref 3.5–5.2)
Alkaline Phosphatase: 82 U/L (ref 39–117)
BUN: 16 mg/dL (ref 6–23)
CO2: 30 mEq/L (ref 19–32)
Calcium: 9.7 mg/dL (ref 8.4–10.5)
Chloride: 101 mEq/L (ref 96–112)
Creatinine, Ser: 0.88 mg/dL (ref 0.40–1.20)
GFR: 70.28 mL/min (ref >60.00)
Glucose, Bld: 92 mg/dL (ref 70–99)
Potassium: 4.9 mEq/L (ref 3.5–5.1)
Sodium: 138 mEq/L (ref 135–145)
Total Bilirubin: 0.6 mg/dL (ref 0.2–1.2)
Total Protein: 6.9 g/dL (ref 6.0–8.3)

## 2021-12-11 LAB — LIPID PANEL
Cholesterol: 213 mg/dL — ABNORMAL HIGH (ref 0–200)
HDL: 67.5 mg/dL (ref >39.00)
LDL Cholesterol: 129 mg/dL — ABNORMAL HIGH (ref 0–99)
NonHDL: 145.11
Total CHOL/HDL Ratio: 3
Triglycerides: 80 mg/dL (ref 0.0–149.0)
VLDL: 16 mg/dL (ref 0.0–40.0)

## 2021-12-11 LAB — CBC WITH DIFFERENTIAL/PLATELET
Basophils Absolute: 0 10*3/uL (ref 0.0–0.1)
Basophils Relative: 0.6 % (ref 0.0–3.0)
Eosinophils Absolute: 0 10*3/uL (ref 0.0–0.7)
Eosinophils Relative: 0.9 % (ref 0.0–5.0)
HCT: 44.7 % (ref 36.0–46.0)
Hemoglobin: 14.8 g/dL (ref 12.0–15.0)
Lymphocytes Relative: 28.7 % (ref 12.0–46.0)
Lymphs Abs: 1.3 10*3/uL (ref 0.7–4.0)
MCHC: 33.2 g/dL (ref 30.0–36.0)
MCV: 91.1 fl (ref 78.0–100.0)
Monocytes Absolute: 0.5 10*3/uL (ref 0.1–1.0)
Monocytes Relative: 10 % (ref 3.0–12.0)
Neutro Abs: 2.8 10*3/uL (ref 1.4–7.7)
Neutrophils Relative %: 59.8 % (ref 43.0–77.0)
Platelets: 223 10*3/uL (ref 150.0–400.0)
RBC: 4.91 Mil/uL (ref 3.87–5.11)
RDW: 13.1 % (ref 11.5–15.5)
WBC: 4.6 10*3/uL (ref 4.0–10.5)

## 2021-12-11 LAB — MICROALBUMIN / CREATININE URINE RATIO
Creatinine,U: 177.7 mg/dL
Microalb Creat Ratio: 0.7 mg/g (ref 0.0–30.0)
Microalb, Ur: 1.2 mg/dL (ref 0.0–1.9)

## 2021-12-11 LAB — TSH: TSH: 0.77 u[IU]/mL (ref 0.35–5.50)

## 2021-12-11 LAB — HEMOGLOBIN A1C: Hgb A1c MFr Bld: 6.2 % (ref 4.6–6.5)

## 2021-12-12 ENCOUNTER — Other Ambulatory Visit (HOSPITAL_COMMUNITY): Payer: Self-pay

## 2021-12-12 ENCOUNTER — Ambulatory Visit (INDEPENDENT_AMBULATORY_CARE_PROVIDER_SITE_OTHER): Payer: No Typology Code available for payment source | Admitting: Internal Medicine

## 2021-12-12 ENCOUNTER — Encounter: Payer: Self-pay | Admitting: Internal Medicine

## 2021-12-12 VITALS — BP 118/66 | HR 72 | Temp 98.2°F | Ht 62.75 in | Wt 210.2 lb

## 2021-12-12 DIAGNOSIS — J683 Other acute and subacute respiratory conditions due to chemicals, gases, fumes and vapors: Secondary | ICD-10-CM

## 2021-12-12 DIAGNOSIS — Z1231 Encounter for screening mammogram for malignant neoplasm of breast: Secondary | ICD-10-CM | POA: Diagnosis not present

## 2021-12-12 DIAGNOSIS — Z0000 Encounter for general adult medical examination without abnormal findings: Secondary | ICD-10-CM

## 2021-12-12 DIAGNOSIS — E669 Obesity, unspecified: Secondary | ICD-10-CM

## 2021-12-12 DIAGNOSIS — Z6837 Body mass index (BMI) 37.0-37.9, adult: Secondary | ICD-10-CM

## 2021-12-12 MED ORDER — SEMAGLUTIDE-WEIGHT MANAGEMENT 0.25 MG/0.5ML ~~LOC~~ SOAJ
0.2500 mg | SUBCUTANEOUS | 2 refills | Status: DC
  Filled 2021-12-12 – 2022-03-02 (×2): qty 2, 28d supply, fill #0

## 2021-12-12 NOTE — Progress Notes (Unsigned)
The patient is here for annual preventive examination and management of other chronic and acute problems.   The risk factors are reflected in the social history.   The roster of all physicians providing medical care to patient - is listed in the Snapshot section of the chart.   Activities of daily living:  The patient is 100% independent in all ADLs: dressing, toileting, feeding as well as independent mobility   Home safety : The patient has smoke detectors in the home. They wear seatbelts.  There are no unsecured firearms at home. There is no violence in the home.    There is no risks for hepatitis, STDs or HIV. There is no   history of blood transfusion. They have no travel history to infectious disease endemic areas of the world.   The patient has seen their dentist in the last six month. They have seen their eye doctor in the last year. The patinet  denies slight hearing difficulty with regard to whispered voices and some television programs.  They have deferred audiologic testing in the last year.  They do not  have excessive sun exposure. Discussed the need for sun protection: hats, long sleeves and use of sunscreen if there is significant sun exposure.    Diet: the importance of a healthy diet is discussed. They do have a healthy diet.   The benefits of regular aerobic exercise were discussed. The patient  exercises  3 to 5 days per week  for  60 minutes.    Depression screen: there are no signs or vegative symptoms of depression- irritability, change in appetite, anhedonia, sadness/tearfullness.   The following portions of the patient's history were reviewed and updated as appropriate: allergies, current medications, past family history, past medical history,  past surgical history, past social history  and problem list.   Visual acuity was not assessed per patient preference since the patient has regular follow up with an  ophthalmologist. Hearing and body mass index were assessed and  reviewed.    During the course of the visit the patient was educated and counseled about appropriate screening and preventive services including : fall prevention , diabetes screening, nutrition counseling, colorectal cancer screening, and recommended immunizations.    Chief Complaint:  HPI     Annual Exam    Additional comments: Physical       Last edited by Adair Laundry, [place] on 12/12/2021  2:29 PM.     weight gain.   Albertine Grates  doing well.  Started 3rd grade .  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  She is recovering financially from [employer] medical bills , daughte rrebecca   Review of Symptoms  Patient denies headache, fevers, malaise, unintentional weight loss, skin rash, eye pain, sinus congestion and sinus pain, sore throat, dysphagia,  hemoptysis , cough, dyspnea, wheezing, chest pain, palpitations, orthopnea, edema, abdominal pain, nausea, melena, diarrhea, constipation, flank pain, dysuria, hematuria, urinary  Frequency, nocturia, numbness, tingling, seizures,  Focal weakness, Loss of consciousness,  Tremor, insomnia, depression, anxiety, and suicidal ideation.    Physical Exam:  BP 118/66 (BP Location: Left Arm, Patient Position: Sitting, Cuff Size: Normal)   Pulse 72   Temp 98.2 F (36.8 C) (Oral)   Ht 5' 2.75" (1.594 m)   Wt 210 lb 3.2 oz (95.3 kg)   SpO2 97%   BMI 37.53 kg/m    General appearance: alert, cooperative and appears stated age Head: Normocephalic, without obvious abnormality, atraumatic Eyes: conjunctivae/corneas  clear. PERRL, EOM's intact. Fundi benign. Ears: normal TM's and external ear canals both ears Nose: Nares normal. Septum midline. Mucosa normal. No drainage or sinus tenderness. Throat: lips, mucosa, and tongue normal; teeth and gums normal Neck: no adenopathy, no carotid bruit, no JVD, supple, symmetrical, trachea midline and thyroid not enlarged, symmetric, no tenderness/mass/nodules Lungs: clear to auscultation  bilaterally Breasts: normal appearance, no masses or tenderness Heart: regular rate and rhythm, S1, S2 normal, no murmur, click, rub or gallop Abdomen: soft, non-tender; bowel sounds normal; no masses,  no organomegaly Extremities: extremities normal, atraumatic, no cyanosis or edema Pulses: 2+ and symmetric Skin: Skin color, texture, turgor normal. No rashes or lesions Neurologic: Alert and oriented X 3, normal strength and tone. Normal symmetric reflexes. Normal coordination and gait.     Assessment and Plan:  Visit for preventive health examination age appropriate education and counseling updated, referrals for preventative services and immunizations addressed, dietary and smoking counseling addressed, most recent labs reviewed.  I have personally reviewed and have noted:   1) the patient's medical and social history 2) The pt's use of alcohol, tobacco, and illicit drugs 3) The patient's current medications and supplements 4) Functional ability including ADL's, fall risk, home safety risk, hearing and visual impairment 5) Diet and physical activities 6) Evidence for depression or mood disorder 7) The patient's height, weight, and BMI have been recorded in the chart.    I have made referrals, and provided counseling and education based on review of the above  Obesity (BMI 30-39.9)  She has trouble suppressing appetite and eating a restricted diet and requesting Wegovy.  Patient has no contraindications to using this medication.  The risks  and benefits were reviewed, and instructions on titration of dose were outlined.  Marland Kitchen    Updated Medication List Outpatient Encounter Medications as of 12/12/2021  Medication Sig   albuterol (PROVENTIL) (2.5 MG/3ML) 0.083% nebulizer solution Take 3 mLs (2.5 mg total) by nebulization every 6 (six) hours as needed for wheezing or shortness of breath.   albuterol (VENTOLIN HFA) 108 (90 Base) MCG/ACT inhaler Inhale 2 puffs into the lungs every 6 (six)  hours as needed for wheezing or shortness of breath.   buPROPion ER (WELLBUTRIN SR) 100 MG 12 hr tablet Take 1 tablet by mouth twice daily   cetirizine (ZYRTEC) 10 MG tablet Take 10 mg by mouth daily.   diazepam (VALIUM) 5 MG tablet Take 1 tablet (5 mg total) by mouth every 8 (eight) hours as needed (vertigo).   escitalopram (LEXAPRO) 10 MG tablet TAKE 1 TABLET BY MOUTH ONCE DAILY   fluticasone (FLONASE) 50 MCG/ACT nasal spray Place into both nostrils daily.   fluticasone-salmeterol (ADVAIR) 500-50 MCG/ACT AEPB INHALE 1 PUFF 2 TIMES DAILY INTO THE LUNGS. RINSE MOUTH AFTER EACH USE TO PREVENT THRUSH   levothyroxine (SYNTHROID) 25 MCG tablet TAKE 1 TABLET BY MOUTH DAILY BEFORE BREAKFAST   meclizine (ANTIVERT) 25 MG tablet Take 1 tablet  by mouth 3 times daily as needed for dizziness.   ondansetron (ZOFRAN) 4 MG tablet Take 1 tablet (4 mg total) by mouth every 8 (eight) hours as needed for nausea or vomiting.   Semaglutide-Weight Management 0.25 MG/0.5ML SOAJ Inject 0.25 mg into the skin once a week.   triamcinolone (NASACORT) 55 MCG/ACT AERO nasal inhaler Place into the nose.   [DISCONTINUED] amoxicillin-clavulanate (AUGMENTIN) 875-125 MG tablet Take 1 tablet by mouth 2 (two) times daily.   [DISCONTINUED] guaiFENesin-codeine (ROBITUSSIN AC) 100-10 MG/5ML syrup Take 5 mLs by  mouth 3 (three) times daily as needed for cough. (Patient not taking: Reported on 12/12/2021)   [DISCONTINUED] phentermine 15 MG capsule Take 1 capsule by mouth daily after breakfast.   [DISCONTINUED] predniSONE (DELTASONE) 10 MG tablet Take 6 tablets by mouth on Day 1 , then reduce by 1 tablet daily until gone   [DISCONTINUED] Semaglutide,0.25 or 0.'5MG'$ /DOS, (OZEMPIC, 0.25 OR 0.5 MG/DOSE,) 2 MG/3ML SOPN Inject 0.5 mg under the skin once a week   No facility-administered encounter medications on file as of 12/12/2021.

## 2021-12-12 NOTE — Assessment & Plan Note (Signed)

## 2021-12-14 ENCOUNTER — Encounter: Payer: Self-pay | Admitting: Internal Medicine

## 2021-12-14 NOTE — Assessment & Plan Note (Signed)
She has trouble suppressing appetite and eating a restricted diet and requesting Wegovy.  Patient has no contraindications to using this medication.  The risks  and benefits were reviewed, and instructions on titration of dose were outlined.  Marland Kitchen

## 2021-12-15 ENCOUNTER — Other Ambulatory Visit (HOSPITAL_COMMUNITY): Payer: Self-pay

## 2021-12-22 ENCOUNTER — Encounter: Payer: Self-pay | Admitting: Internal Medicine

## 2021-12-29 ENCOUNTER — Other Ambulatory Visit (HOSPITAL_COMMUNITY): Payer: Self-pay

## 2022-02-01 ENCOUNTER — Encounter: Payer: Self-pay | Admitting: Internal Medicine

## 2022-02-18 ENCOUNTER — Other Ambulatory Visit (HOSPITAL_COMMUNITY): Payer: Self-pay

## 2022-02-27 ENCOUNTER — Encounter: Payer: Self-pay | Admitting: Internal Medicine

## 2022-03-02 ENCOUNTER — Other Ambulatory Visit: Payer: Self-pay | Admitting: Internal Medicine

## 2022-03-02 ENCOUNTER — Other Ambulatory Visit (HOSPITAL_COMMUNITY): Payer: Self-pay

## 2022-03-02 DIAGNOSIS — Z0000 Encounter for general adult medical examination without abnormal findings: Secondary | ICD-10-CM

## 2022-03-02 DIAGNOSIS — Z1231 Encounter for screening mammogram for malignant neoplasm of breast: Secondary | ICD-10-CM

## 2022-03-02 DIAGNOSIS — E669 Obesity, unspecified: Secondary | ICD-10-CM

## 2022-03-02 DIAGNOSIS — J683 Other acute and subacute respiratory conditions due to chemicals, gases, fumes and vapors: Secondary | ICD-10-CM

## 2022-03-03 ENCOUNTER — Ambulatory Visit
Admission: RE | Admit: 2022-03-03 | Discharge: 2022-03-03 | Disposition: A | Discharge status: Home / Self Care | Payer: 59 | Source: Ambulatory Visit | Attending: Internal Medicine | Admitting: Internal Medicine

## 2022-03-03 DIAGNOSIS — Z1231 Encounter for screening mammogram for malignant neoplasm of breast: Secondary | ICD-10-CM

## 2022-03-04 ENCOUNTER — Other Ambulatory Visit: Payer: Self-pay

## 2022-03-05 ENCOUNTER — Other Ambulatory Visit (HOSPITAL_COMMUNITY): Payer: Self-pay

## 2022-03-05 MED ORDER — ZEPBOUND 2.5 MG/0.5ML ~~LOC~~ SOAJ
2.5000 mg | SUBCUTANEOUS | 0 refills | Status: DC
  Filled 2022-03-05: qty 2, 28d supply, fill #0

## 2022-03-16 ENCOUNTER — Encounter: Payer: Self-pay | Admitting: Internal Medicine

## 2022-03-16 ENCOUNTER — Ambulatory Visit (INDEPENDENT_AMBULATORY_CARE_PROVIDER_SITE_OTHER): Payer: 59 | Admitting: Internal Medicine

## 2022-03-16 ENCOUNTER — Other Ambulatory Visit (HOSPITAL_COMMUNITY): Payer: Self-pay

## 2022-03-16 VITALS — BP 122/80 | HR 61 | Temp 97.8°F | Ht 62.75 in | Wt 213.6 lb

## 2022-03-16 DIAGNOSIS — Z6837 Body mass index (BMI) 37.0-37.9, adult: Secondary | ICD-10-CM | POA: Diagnosis not present

## 2022-03-16 DIAGNOSIS — E785 Hyperlipidemia, unspecified: Secondary | ICD-10-CM | POA: Diagnosis not present

## 2022-03-16 DIAGNOSIS — E034 Atrophy of thyroid (acquired): Secondary | ICD-10-CM | POA: Diagnosis not present

## 2022-03-16 DIAGNOSIS — F4329 Adjustment disorder with other symptoms: Secondary | ICD-10-CM | POA: Diagnosis not present

## 2022-03-16 DIAGNOSIS — R7303 Prediabetes: Secondary | ICD-10-CM | POA: Diagnosis not present

## 2022-03-16 DIAGNOSIS — J683 Other acute and subacute respiratory conditions due to chemicals, gases, fumes and vapors: Secondary | ICD-10-CM | POA: Diagnosis not present

## 2022-03-16 DIAGNOSIS — E669 Obesity, unspecified: Secondary | ICD-10-CM | POA: Diagnosis not present

## 2022-03-16 DIAGNOSIS — K760 Fatty (change of) liver, not elsewhere classified: Secondary | ICD-10-CM

## 2022-03-16 MED ORDER — LEVOTHYROXINE SODIUM 25 MCG PO TABS
12.5000 ug | ORAL_TABLET | Freq: Every day | ORAL | 0 refills | Status: DC
  Filled 2022-03-16 – 2022-04-07 (×2): qty 45, 90d supply, fill #0

## 2022-03-16 NOTE — Assessment & Plan Note (Signed)
She has trouble suppressing appetite and eating a restricted diet and rhas recently started taking  Wegovy.  Patient has no contraindications to using this medication.  The risks  and benefits were reviewed, and instructions on titration of dose were outlined.  Marland Kitchen

## 2022-03-16 NOTE — Assessment & Plan Note (Signed)
Her fasting glucose is not diagnostic of diabetes, but she is at risk for diabetes.  Trial of Wegovy started  2 weeks ago (delay due to insurance coverage etc) along with a low glycemic index diet and regular  participation in an aerobic  exercise activity.  We will check an A1c in 3 months.   Lab Results  Component Value Date   HGBA1C 6.2 12/11/2021

## 2022-03-16 NOTE — Assessment & Plan Note (Signed)
Current liver enzymes reviewed  and all modifiable risk factors including obesity  and hyperlipidemia have been addressed .   Lab Results  Component Value Date   ALT 40 (H) 12/11/2021   AST 32 12/11/2021   ALKPHOS 82 12/11/2021   BILITOT 0.6 12/11/2021

## 2022-03-16 NOTE — Progress Notes (Unsigned)
Subjective:  Patient ID: Samantha Raymond, female    DOB: August 23, 1960  Age: 62 y.o. MRN: [id number]  CC: The primary encounter diagnosis was Hyperlipidemia, unspecified hyperlipidemia type. Diagnoses of Prediabetes, Hypothyroidism due to acquired atrophy of thyroid, and Class 2 severe obesity with serious comorbidity and body mass index (BMI) of 37.0 to 37.9 in adult, unspecified obesity type [facility]) were also pertinent to this visit.   HPI Samantha Raymond presents for  Chief Complaint  Patient presents with   Medical Management of Chronic Issues    3 mnth f/u  Pt wants to know if she should stop taking the wegovy and start on zepbound     Father in law died of metastatic CA  on February 11, 2023,  mother in law had to be placed in memory care.   Visited mother in [place] , 15   who lives independently near her sister .  Had a great visit   Started Wegovy 2 weeks ago. $20/month , by hoem scales  has lost 5 lbs  so far.  Riding stationery bike several times per week .   Samantha Raymond 62 yrs old is going well.  Hair has grown back.  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 No apneic spells.    Outpatient Medications Prior to Visit  Medication Sig Dispense Refill   albuterol (PROVENTIL) (2.5 MG/3ML) 0.083% nebulizer solution Take 3 mLs (2.5 mg total) by nebulization every 6 (six) hours as needed for wheezing or shortness of breath. 45 mL 1   albuterol (VENTOLIN HFA) 108 (90 Base) MCG/ACT inhaler Inhale 2 puffs into the lungs every 6 (six) hours as needed for wheezing or shortness of breath. 18 g 3   buPROPion ER (WELLBUTRIN SR) 100 MG 12 hr tablet Take 1 tablet by mouth twice daily 180 tablet 1   cetirizine (ZYRTEC) 10 MG tablet Take 10 mg by mouth daily.     escitalopram (LEXAPRO) 10 MG tablet TAKE 1 TABLET BY MOUTH ONCE DAILY 90 tablet 1   fluticasone-salmeterol (ADVAIR) 500-50 MCG/ACT AEPB INHALE 1 PUFF 2 TIMES DAILY INTO THE LUNGS. RINSE MOUTH AFTER EACH USE TO  PREVENT THRUSH 60 each 5   levothyroxine (SYNTHROID) 25 MCG tablet Take 1 tablet (25 mcg total) by mouth daily before breakfast. 90 tablet 0   ondansetron (ZOFRAN) 4 MG tablet Take 1 tablet (4 mg total) by mouth every 8 (eight) hours as needed for nausea or vomiting. 20 tablet 0   Semaglutide-Weight Management 0.25 MG/0.5ML SOAJ Inject 0.25 mg into the skin once a week. 2 mL 2   tirzepatide (ZEPBOUND) 2.5 MG/0.5ML Pen Inject 2.5 mg into the skin once a week. 6 mL 0   triamcinolone (NASACORT) 55 MCG/ACT AERO nasal inhaler Place into the nose. 16.9 mL 11   diazepam (VALIUM) 5 MG tablet Take 1 tablet (5 mg total) by mouth every 8 (eight) hours as needed (vertigo). (Patient not taking: Reported on 03/16/2022) 30 tablet 0   fluticasone (FLONASE) 50 MCG/ACT nasal spray Place into both nostrils daily. (Patient not taking: Reported on 03/16/2022)     meclizine (ANTIVERT) 25 MG tablet Take 1 tablet  by mouth 3 times daily as needed for dizziness. (Patient not taking: Reported on 03/16/2022) 30 tablet 0   No facility-administered medications prior to visit.    Review of Systems;  Patient denies headache, fevers, malaise, unintentional weight loss, skin rash, eye pain, sinus congestion and sinus pain, sore throat, dysphagia,  hemoptysis , cough, dyspnea, wheezing, chest pain, palpitations, orthopnea, edema, abdominal pain, nausea, melena, diarrhea, constipation, flank pain, dysuria, hematuria, urinary  Frequency, nocturia, numbness, tingling, seizures,  Focal weakness, Loss of consciousness,  Tremor, insomnia, depression, anxiety, and suicidal ideation.      Objective:  BP 122/80   Pulse 61   Temp 97.8 F (36.6 C)   Ht 5' 2.75" (1.594 m)   Wt 213 lb 9.6 oz (96.9 kg)   SpO2 98%   BMI 38.14 kg/m   BP Readings from Last 3 Encounters:  03/16/22 122/80  12/12/21 118/66  06/18/21 128/78    Wt Readings from Last 3 Encounters:  03/16/22 213 lb 9.6 oz (96.9 kg)  12/12/21 210 lb 3.2 oz (95.3 kg)   11/11/21 198 lb (89.8 kg)    Physical Exam  Lab Results  Component Value Date   HGBA1C 6.2 12/11/2021   HGBA1C 5.8 11/13/2020   HGBA1C 6.0 (H) 05/08/2020    Lab Results  Component Value Date   CREATININE 0.88 12/11/2021   CREATININE 0.84 04/18/2021   CREATININE 0.83 11/13/2020    Lab Results  Component Value Date   WBC 4.6 12/11/2021   HGB 14.8 12/11/2021   HCT 44.7 12/11/2021   PLT 223.0 12/11/2021   GLUCOSE 92 12/11/2021   CHOL 213 (H) 12/11/2021   TRIG 80.0 12/11/2021   HDL 67.50 12/11/2021   LDLCALC 129 (H) 12/11/2021   ALT 40 (H) 12/11/2021   AST 32 12/11/2021   NA 138 12/11/2021   K 4.9 12/11/2021   CL 101 12/11/2021   CREATININE 0.88 12/11/2021   BUN 16 12/11/2021   CO2 30 12/11/2021   TSH 0.77 12/11/2021   HGBA1C 6.2 12/11/2021   MICROALBUR 1.2 12/11/2021    MM 3D SCREEN BREAST BILATERAL  Result Date: 03/04/2022 CLINICAL DATA:  Screening. EXAM: DIGITAL SCREENING BILATERAL MAMMOGRAM WITH TOMOSYNTHESIS AND CAD TECHNIQUE: Bilateral screening digital craniocaudal and mediolateral oblique mammograms were obtained. Bilateral screening digital breast tomosynthesis was performed. The images were evaluated with computer-aided detection. COMPARISON:  Previous exam(s). ACR Breast Density Category b: There are scattered areas of fibroglandular density. FINDINGS: There are no findings suspicious for malignancy. IMPRESSION: No mammographic evidence of malignancy. A result letter of this screening mammogram will be mailed directly to the patient. RECOMMENDATION: Screening mammogram in one year. (Code:SM-B-01Y) BI-RADS CATEGORY  1: Negative. Electronically Signed   By: Samantha Raymond M.D.   On: 03/04/2022 09:36    Assessment & Plan:  .Hyperlipidemia, unspecified hyperlipidemia type  Prediabetes -     Lipid panel -     Comprehensive metabolic panel -     Hemoglobin A1c  Hypothyroidism due to acquired atrophy of thyroid -     TSH  Class 2 severe obesity with serious  comorbidity and body mass index (BMI) of 37.0 to 37.9 in adult, unspecified obesity type ([place])     I provided 30 minutes of face-to-face time during this encounter reviewing patient's last visit with me, patient's  most recent visit with cardiology,  nephrology,  and neurology,  recent surgical and non surgical procedures, previous  labs and imaging studies, counseling on currently addressed issues,  and post visit ordering to diagnostics and therapeutics .   Follow-up: No follow-ups on file.   Crecencio Mc, MD

## 2022-03-16 NOTE — Assessment & Plan Note (Signed)
She has been asymptomatic .  Using advair prn, and denies any recent flares.

## 2022-03-17 NOTE — Assessment & Plan Note (Signed)
Thyroid function is WNL on current dose o f levothyroxine 25 mcg daily .  Continue current dose  Lab Results  Component Value Date   TSH 0.77 12/11/2021

## 2022-03-17 NOTE — Assessment & Plan Note (Signed)
Secondary to job, home and family stressors.  She is managing her stress without medications and using exercise as a stress reliever .

## 2022-03-18 ENCOUNTER — Encounter: Payer: Self-pay | Admitting: Internal Medicine

## 2022-03-18 MED ORDER — SEMAGLUTIDE-WEIGHT MANAGEMENT 0.5 MG/0.5ML ~~LOC~~ SOAJ
0.5000 mg | SUBCUTANEOUS | 2 refills | Status: DC
  Filled 2022-03-18: qty 2, 28d supply, fill #0

## 2022-03-18 NOTE — Addendum Note (Signed)
Addended by: Crecencio Mc on: 03/18/2022 05:22 PM   Modules accepted: Orders

## 2022-03-19 ENCOUNTER — Other Ambulatory Visit (HOSPITAL_COMMUNITY): Payer: Self-pay

## 2022-03-21 ENCOUNTER — Other Ambulatory Visit (HOSPITAL_COMMUNITY): Payer: Self-pay

## 2022-03-27 ENCOUNTER — Encounter: Payer: Self-pay | Admitting: Internal Medicine

## 2022-03-27 ENCOUNTER — Other Ambulatory Visit (HOSPITAL_COMMUNITY): Payer: Self-pay

## 2022-03-27 NOTE — Telephone Encounter (Signed)
Routing to PCP for Rx & to PA team as well

## 2022-03-28 ENCOUNTER — Other Ambulatory Visit (HOSPITAL_COMMUNITY): Payer: Self-pay

## 2022-03-28 MED ORDER — ZEPBOUND 5 MG/0.5ML ~~LOC~~ SOAJ
5.0000 mg | SUBCUTANEOUS | 2 refills | Status: DC
  Filled 2022-03-28 – 2022-04-09 (×3): qty 2, 28d supply, fill #0
  Filled 2022-05-02: qty 2, 28d supply, fill #1

## 2022-03-28 NOTE — Telephone Encounter (Signed)
Pa required for Zepbound

## 2022-04-01 NOTE — Telephone Encounter (Signed)
Can someone check on the status of this PA

## 2022-04-01 NOTE — Telephone Encounter (Signed)
Patient called about the status of the PA for her tirzepatide (ZEPBOUND) 5 MG/0.5ML Pen. Please send message through [place]. Thanks

## 2022-04-06 ENCOUNTER — Other Ambulatory Visit (HOSPITAL_COMMUNITY): Payer: Self-pay

## 2022-04-07 ENCOUNTER — Other Ambulatory Visit: Payer: Self-pay

## 2022-04-07 ENCOUNTER — Other Ambulatory Visit (INDEPENDENT_AMBULATORY_CARE_PROVIDER_SITE_OTHER): Payer: Self-pay | Admitting: Internal Medicine

## 2022-04-07 ENCOUNTER — Other Ambulatory Visit: Payer: Self-pay | Admitting: Internal Medicine

## 2022-04-07 ENCOUNTER — Telehealth: Payer: Self-pay

## 2022-04-07 ENCOUNTER — Other Ambulatory Visit (HOSPITAL_COMMUNITY): Payer: Self-pay

## 2022-04-07 DIAGNOSIS — F32A Depression, unspecified: Secondary | ICD-10-CM

## 2022-04-07 MED ORDER — ESCITALOPRAM OXALATE 10 MG PO TABS
10.0000 mg | ORAL_TABLET | Freq: Every day | ORAL | 1 refills | Status: DC
  Filled 2022-04-07 – 2022-04-08 (×3): qty 90, 90d supply, fill #0
  Filled 2022-08-24: qty 90, 90d supply, fill #1

## 2022-04-07 MED ORDER — BUPROPION HCL ER (SR) 100 MG PO TB12
100.0000 mg | ORAL_TABLET | Freq: Two times a day (BID) | ORAL | 1 refills | Status: DC
  Filled 2022-04-07 – 2022-04-08 (×3): qty 180, 90d supply, fill #0

## 2022-04-07 NOTE — Telephone Encounter (Signed)
Status of PA.

## 2022-04-07 NOTE — Telephone Encounter (Signed)
Samantha Raymond (Key: [facility]) PA Case ID #: [id number] Rx #: [id number] Status : Sent to Plan  Drug : Zepbound 5MG/0.5ML pen-injectors Form :  MedImpact ePA Form 2017 NCPDP

## 2022-04-08 ENCOUNTER — Other Ambulatory Visit (HOSPITAL_COMMUNITY): Payer: Self-pay

## 2022-04-08 ENCOUNTER — Telehealth: Payer: Self-pay

## 2022-04-08 NOTE — Telephone Encounter (Signed)
Samantha Raymond (Key: [facility] PA Case ID #: [id number] Rx #: [id number]  Outcome Approved on February 20 The request has been approved. The authorization is effective from 04/07/2022 to 10/04/2022, as long as the member is enrolled in their current health plan. Authorization Expiration Date: 10/03/2022 Drug Zepbound 5MG/0.5ML pen-injectors ePA cloud logo Form MedImpact ePA Form 2017 [address]

## 2022-04-09 ENCOUNTER — Encounter (HOSPITAL_COMMUNITY): Payer: Self-pay

## 2022-04-09 ENCOUNTER — Other Ambulatory Visit: Payer: Self-pay

## 2022-04-09 ENCOUNTER — Other Ambulatory Visit (HOSPITAL_COMMUNITY): Payer: Self-pay

## 2022-04-11 ENCOUNTER — Other Ambulatory Visit (HOSPITAL_COMMUNITY): Payer: Self-pay

## 2022-05-02 ENCOUNTER — Other Ambulatory Visit (HOSPITAL_COMMUNITY): Payer: Self-pay

## 2022-05-04 ENCOUNTER — Other Ambulatory Visit: Payer: Self-pay

## 2022-05-14 ENCOUNTER — Other Ambulatory Visit: Payer: Self-pay

## 2022-05-25 ENCOUNTER — Telehealth: Payer: Self-pay

## 2022-05-25 NOTE — Telephone Encounter (Signed)
Patient states she is experiencing wheezing (does not have her inhaler with her - no refills on inhaler), sore throat, and her chest is tight.  Patient states she is at [employer] at [facility], but would like to know if we have any openings today.  I let patient know that we do not have openings at this time.  Patient declined Chief Technology Officer.  Patient states she will just have a breathing treatment where she is.

## 2022-05-25 NOTE — Telephone Encounter (Signed)
Spoke with pt and she stated that she did a nebulizer treatment today and she feels much better.

## 2022-07-29 ENCOUNTER — Ambulatory Visit (INDEPENDENT_AMBULATORY_CARE_PROVIDER_SITE_OTHER): Payer: 59

## 2022-07-29 ENCOUNTER — Ambulatory Visit (INDEPENDENT_AMBULATORY_CARE_PROVIDER_SITE_OTHER): Payer: 59 | Admitting: Family Medicine

## 2022-07-29 ENCOUNTER — Telehealth: Payer: Self-pay | Admitting: Internal Medicine

## 2022-07-29 ENCOUNTER — Other Ambulatory Visit: Payer: Self-pay

## 2022-07-29 ENCOUNTER — Other Ambulatory Visit (HOSPITAL_COMMUNITY): Payer: Self-pay

## 2022-07-29 VITALS — BP 132/88 | HR 77 | Temp 98.3°F | Ht 62.75 in | Wt 207.6 lb

## 2022-07-29 DIAGNOSIS — J4521 Mild intermittent asthma with (acute) exacerbation: Secondary | ICD-10-CM | POA: Diagnosis not present

## 2022-07-29 DIAGNOSIS — R0602 Shortness of breath: Secondary | ICD-10-CM | POA: Diagnosis not present

## 2022-07-29 DIAGNOSIS — J209 Acute bronchitis, unspecified: Secondary | ICD-10-CM

## 2022-07-29 MED ORDER — SACCHAROMYCES BOULARDII 250 MG PO CAPS
250.0000 mg | ORAL_CAPSULE | Freq: Every day | ORAL | 0 refills | Status: DC
  Filled 2022-07-29: qty 90, 90d supply, fill #0

## 2022-07-29 MED ORDER — ALBUTEROL SULFATE HFA 108 (90 BASE) MCG/ACT IN AERS
2.0000 | INHALATION_SPRAY | Freq: Four times a day (QID) | RESPIRATORY_TRACT | 3 refills | Status: AC | PRN
  Filled 2022-07-29: qty 6.7, 25d supply, fill #0

## 2022-07-29 MED ORDER — PREDNISONE 10 MG PO TABS
ORAL_TABLET | ORAL | 0 refills | Status: AC
  Filled 2022-07-29 (×2): qty 15, 5d supply, fill #0

## 2022-07-29 MED ORDER — FLUTICASONE-SALMETEROL 500-50 MCG/ACT IN AEPB
INHALATION_SPRAY | RESPIRATORY_TRACT | 5 refills | Status: DC
  Filled 2022-07-29 (×2): qty 60, 30d supply, fill #0
  Filled 2022-08-24: qty 60, 30d supply, fill #1
  Filled 2023-04-01: qty 60, 30d supply, fill #2
  Filled 2023-06-10: qty 60, 30d supply, fill #3

## 2022-07-29 MED ORDER — AZITHROMYCIN 250 MG PO TABS
ORAL_TABLET | ORAL | 0 refills | Status: AC
  Filled 2022-07-29 (×2): qty 6, 5d supply, fill #0

## 2022-07-29 MED ORDER — PREDNISONE 10 MG PO TABS
ORAL_TABLET | ORAL | 0 refills | Status: DC
  Filled 2022-07-29 (×2): qty 21, 6d supply, fill #0

## 2022-07-29 NOTE — Telephone Encounter (Signed)
[facility] called in stating that they need clarification on med predniSONE (DELTASONE) 10 MG tablet. They call back number its [phone]

## 2022-07-29 NOTE — Patient Instructions (Addendum)
It was a pleasure meeting you today. Thank you for allowing me to take part in your health care.  Our goals for today as we discussed include:  Start Prednisone pack as directed Start Z Pack Start probiotics daily and continue for 14 days after antibiotics Continue to hydrate Use Albuterol every 4 hours as needed for shortness of breath or wheezing Restart Advair 2 puffs daily Continue Nasocort   If you have any questions or concerns, please do not hesitate to call the office at [phone].  I look forward to our next visit and until then take care and stay safe.  Regards,   Dana Allan, MD   [facility]

## 2022-07-29 NOTE — Progress Notes (Signed)
SUBJECTIVE:   Chief Complaint  Patient presents with   Cough    Cough since 07/25/22 Congestion Ear & Head ache on left side Pain level 3-4 Sinus pressure on left side   HPI Patient presents to clinic for acute visit.  Symptoms started 4 days ago after trip to beach.  Endorses cough and congestion.  Endorses non productive cough, left side sinus pressure.  Denies any fevers but has noted some chills and sweating.  She becomes short of breath with cough or with exertion.  Has increased wheezing.  Denies any chest pain, difficulty breathing, pleuritic pain, nausea/vomiting, rhinorrhea, nasal drip, diarrhea or recent sick contacts.  History of asthma.  She had  tried a couple of days of nebulizer therapy which helped.  Has been using albuterol every 6 hours with mild relief.  Does have Advair but has not used regularly.  Needs refill on Flonase nasal spray.  Non-smoker and no exposure to new pets or animals. No anosmia, ageusia.  Appetite good and hydrating well.    PERTINENT PMH / PSH: Reactive airway dysfunction Mild intermittent Asthma  OBJECTIVE:  BP 132/88   Pulse 77   Temp 98.3 F (36.8 C)   Ht 5' 2.75" (1.594 m)   Wt 207 lb 9.6 oz (94.2 kg)   SpO2 98%   BMI 37.07 kg/m    Physical Exam Vitals reviewed.  Constitutional:      General: She is not in acute distress.    Appearance: Normal appearance. She is obese. She is not ill-appearing, toxic-appearing or diaphoretic.  Eyes:     General:        Right eye: No discharge.        Left eye: No discharge.     Conjunctiva/sclera: Conjunctivae normal.  Cardiovascular:     Rate and Rhythm: Normal rate and regular rhythm.     Heart sounds: Normal heart sounds.  Pulmonary:     Effort: No respiratory distress.     Breath sounds: No stridor. Wheezing and rhonchi present.  Musculoskeletal:        General: Normal range of motion.  Skin:    General: Skin is warm and dry.  Neurological:     General: No focal deficit present.      Mental Status: She is alert and oriented to person, place, and time. Mental status is at baseline.  Psychiatric:        Mood and Affect: Mood normal.        Behavior: Behavior normal.        Thought Content: Thought content normal.        Judgment: Judgment normal.     ASSESSMENT/PLAN:  Mild intermittent asthma with exacerbation Assessment & Plan: Acute exacerbation. In no acute respiratory distress.  No signs of volume overload.  Low suspicion for PE Start Prednisone taper pack x 5 days Continue Albuterol q 4 hr x 24-48 hrs then as needed for sob or wheezing. Restart Advair 1 puff BID, rinse mouth after each use Start Zpack given subjective fever and nonproductive cough Start Probiotics daily and continue for 14 days after therapy Chest xray Follow up in 1 week or sooner if worsening     Orders: -     DG Chest 2 View; Future -     Azithromycin; Take 2 tablets on day 1, then 1 tablet daily on days 2 through 5  Dispense: 6 tablet; Refill: 0 -     Saccharomyces boulardii; Take 1 capsule (250 mg  total) by mouth daily.  Dispense: 90 capsule; Refill: 0 -     Fluticasone-Salmeterol; INHALE 1 PUFF 2 TIMES DAILY INTO THE LUNGS. RINSE MOUTH AFTER EACH USE TO PREVENT THRUSH  Dispense: 60 each; Refill: 5 -     Albuterol Sulfate HFA; Inhale 2 puffs into the lungs every 6 hours as needed for wheezing or shortness of breath.  Dispense: 6.7 g; Refill: 3 -     predniSONE; Take 5 tablets by mouth daily for 1 day, THEN 4 tablets daily for 1 day, THEN 3 tablets daily for 1 day, THEN 2 tablets daily for 1 day, THEN 1 tablet daily for 1 day.  Dispense: 15 tablet; Refill: 0  Shortness of breath -     DG Chest 2 View; Future   PDMP reviewed  Return in about 1 week (around 08/05/2022) for PCP.  Dana Allan, MD

## 2022-07-30 ENCOUNTER — Other Ambulatory Visit (HOSPITAL_COMMUNITY): Payer: Self-pay

## 2022-08-03 ENCOUNTER — Encounter: Payer: Self-pay | Admitting: Family Medicine

## 2022-08-04 NOTE — Telephone Encounter (Signed)
noted 

## 2022-08-05 ENCOUNTER — Encounter: Payer: Self-pay | Admitting: Family Medicine

## 2022-08-05 ENCOUNTER — Ambulatory Visit: Payer: 59 | Admitting: Family Medicine

## 2022-08-06 ENCOUNTER — Telehealth: Payer: Self-pay

## 2022-08-06 ENCOUNTER — Ambulatory Visit (INDEPENDENT_AMBULATORY_CARE_PROVIDER_SITE_OTHER): Payer: 59 | Admitting: Family Medicine

## 2022-08-06 ENCOUNTER — Other Ambulatory Visit: Payer: Self-pay

## 2022-08-06 ENCOUNTER — Encounter: Payer: Self-pay | Admitting: Family Medicine

## 2022-08-06 VITALS — BP 122/64 | HR 68 | Temp 98.9°F | Ht 62.75 in | Wt 211.0 lb

## 2022-08-06 DIAGNOSIS — J683 Other acute and subacute respiratory conditions due to chemicals, gases, fumes and vapors: Secondary | ICD-10-CM | POA: Diagnosis not present

## 2022-08-06 DIAGNOSIS — R0602 Shortness of breath: Secondary | ICD-10-CM

## 2022-08-06 DIAGNOSIS — R059 Cough, unspecified: Secondary | ICD-10-CM | POA: Diagnosis not present

## 2022-08-06 DIAGNOSIS — J45901 Unspecified asthma with (acute) exacerbation: Secondary | ICD-10-CM | POA: Diagnosis not present

## 2022-08-06 LAB — CBC WITH DIFFERENTIAL/PLATELET
Basophils Absolute: 0 10*3/uL (ref 0.0–0.1)
Basophils Relative: 0.7 % (ref 0.0–3.0)
Eosinophils Absolute: 0 10*3/uL (ref 0.0–0.7)
Eosinophils Relative: 0.7 % (ref 0.0–5.0)
HCT: 42.6 % (ref 36.0–46.0)
Hemoglobin: 14 g/dL (ref 12.0–15.0)
Lymphocytes Relative: 25.2 % (ref 12.0–46.0)
Lymphs Abs: 1.5 10*3/uL (ref 0.7–4.0)
MCHC: 32.9 g/dL (ref 30.0–36.0)
MCV: 91.1 fl (ref 78.0–100.0)
Monocytes Absolute: 0.5 10*3/uL (ref 0.1–1.0)
Monocytes Relative: 8.6 % (ref 3.0–12.0)
Neutro Abs: 3.9 10*3/uL (ref 1.4–7.7)
Neutrophils Relative %: 64.8 % (ref 43.0–77.0)
Platelets: 260 10*3/uL (ref 150.0–400.0)
RBC: 4.68 Mil/uL (ref 3.87–5.11)
RDW: 13.4 % (ref 11.5–15.5)
WBC: 6 10*3/uL (ref 4.0–10.5)

## 2022-08-06 MED ORDER — BREZTRI AEROSPHERE 160-9-4.8 MCG/ACT IN AERO
2.0000 | INHALATION_SPRAY | Freq: Two times a day (BID) | RESPIRATORY_TRACT | 11 refills | Status: DC
  Filled 2022-08-06: qty 10.7, 30d supply, fill #0

## 2022-08-06 NOTE — Progress Notes (Signed)
   SUBJECTIVE:   Chief Complaint  Patient presents with   asthma exacerbation   HPI Patient presents for follow up recent office visit for acute exacerbation of asthma  Has restarted Advair 1 puff twice daily and using Albuterol every 4 hours,  Completed course of Antibiotics and Prednisone taper.  Feeling much better but still having increase wheezing and shortness of breath especially with long conversations. Continues to have cough, non productive, easily triggered.  Denies any fevers, chest pain, palpitations, nausea/vomiting.  No pleuritic pain.    PERTINENT PMH / PSH: Mild intermittent asthma  OBJECTIVE:  BP 122/64   Pulse 68   Temp 98.9 F (37.2 C) (Oral)   Ht 5' 2.75" (1.594 m)   Wt 211 lb (95.7 kg)   SpO2 99%   BMI 37.68 kg/m    Physical Exam Vitals reviewed.  Constitutional:      General: She is not in acute distress.    Appearance: Normal appearance. She is obese. She is not ill-appearing, toxic-appearing or diaphoretic.  Eyes:     General:        Right eye: No discharge.        Left eye: No discharge.     Conjunctiva/sclera: Conjunctivae normal.  Cardiovascular:     Rate and Rhythm: Normal rate and regular rhythm.     Heart sounds: Normal heart sounds.  Pulmonary:     Effort: No respiratory distress.     Breath sounds: Wheezing present.  Musculoskeletal:        General: Normal range of motion.  Skin:    General: Skin is warm and dry.  Neurological:     General: No focal deficit present.     Mental Status: She is alert and oriented to person, place, and time. Mental status is at baseline.  Psychiatric:        Mood and Affect: Mood normal.        Behavior: Behavior normal.        Thought Content: Thought content normal.        Judgment: Judgment normal.     ASSESSMENT/PLAN:  Reactive airways dysfunction syndrome, moderate persistent, uncomplicated (HCC) Assessment & Plan: Improving symptoms. Recent chest xray negative for cardiopulmonary disease.  Expiratory wheezing on lung Continue Albuterol 2 puffs q6 h prn for SOB/wheezingexam today but has improved.  Stop Advair Start Breztri 2 puffs BID, sample provided. Can deescalate once symptoms resolved Rinse mouth after each use of Breztri  Check CBC today  Orders: -     Breztri Aerosphere; Inhale 2 puffs into the lungs 2 (two) times daily.  Dispense: 10.7 g; Refill: 11 -     CBC with Differential/Platelet   Recommend PCV 20 Recommend Tetanus booster PDMP reviewed  Return in about 2 weeks (around 08/20/2022) for PCP.  Dana Allan, MD

## 2022-08-06 NOTE — Telephone Encounter (Signed)
Medication Samples have been provided to the patient.  Drug name: Brezteri       Strength: 160 mcg/ 9 mcg/ 4.8 mcg        Qty: 1 box  LOT: [id number]  Exp.Date: 10/2024  Dosing instructions: Inhale 2 puffs into lungs two times daily.   The patient has been instructed regarding the correct time, dose, and frequency of taking this medication, including desired effects and most common side effects.   Domingos Riggi 11:40 AM 08/06/2022

## 2022-08-06 NOTE — Patient Instructions (Addendum)
It was a pleasure meeting you today. Thank you for allowing me to take part in your health care.  Our goals for today as we discussed include:  Stop Advair Start Breztri 2 puffs 2 times a day Albuterol 2 puffs every 4-6 hours as needed for shortness of breath or wheezing  Blood work today  Follow up in 2 weeks or sooner if worsens   If you have any questions or concerns, please do not hesitate to call the office at [phone].  I look forward to our next visit and until then take care and stay safe.  Regards,   Dana Allan, MD   [facility]

## 2022-08-07 ENCOUNTER — Telehealth: Payer: Self-pay | Admitting: Internal Medicine

## 2022-08-07 ENCOUNTER — Other Ambulatory Visit (HOSPITAL_COMMUNITY): Payer: Self-pay

## 2022-08-07 MED ORDER — NYSTATIN 100000 UNIT/ML MT SUSP
5.0000 mL | Freq: Four times a day (QID) | OROMUCOSAL | 0 refills | Status: DC
  Filled 2022-08-07: qty 60, 3d supply, fill #0

## 2022-08-07 NOTE — Telephone Encounter (Signed)
Phone call to pt, she is aware of new medication.

## 2022-08-07 NOTE — Telephone Encounter (Signed)
Patient saw Dr Clent Ridges yesterday. She is calling today because she woke up with thrush. She wanted to know if Dr Clent Ridges would call something in for it.

## 2022-08-07 NOTE — Telephone Encounter (Signed)
I will send in nystatin for her. If not improving with this then she will need to be evaluated.

## 2022-08-07 NOTE — Telephone Encounter (Signed)
Routing to [employer] of the day

## 2022-08-07 NOTE — Addendum Note (Signed)
Addended by: Glori Luis on: 08/07/2022 04:54 PM   Modules accepted: Orders

## 2022-08-08 ENCOUNTER — Encounter: Payer: Self-pay | Admitting: Family Medicine

## 2022-08-08 DIAGNOSIS — J209 Acute bronchitis, unspecified: Secondary | ICD-10-CM | POA: Insufficient documentation

## 2022-08-08 DIAGNOSIS — R0602 Shortness of breath: Secondary | ICD-10-CM | POA: Insufficient documentation

## 2022-08-08 DIAGNOSIS — J4521 Mild intermittent asthma with (acute) exacerbation: Secondary | ICD-10-CM | POA: Insufficient documentation

## 2022-08-08 NOTE — Assessment & Plan Note (Addendum)
Acute exacerbation. In no acute respiratory distress.  No signs of volume overload.  Low suspicion for PE Start Prednisone taper pack x 5 days Continue Albuterol q 4 hr x 24-48 hrs then as needed for sob or wheezing. Restart Advair 1 puff BID, rinse mouth after each use Start Zpack given subjective fever and nonproductive cough Start Probiotics daily and continue for 14 days after therapy Chest xray Follow up in 1 week or sooner if worsening

## 2022-08-10 ENCOUNTER — Other Ambulatory Visit (HOSPITAL_COMMUNITY): Payer: Self-pay

## 2022-08-15 ENCOUNTER — Encounter: Payer: Self-pay | Admitting: Family Medicine

## 2022-08-15 NOTE — Assessment & Plan Note (Addendum)
Improving symptoms. Recent chest xray negative for cardiopulmonary disease. Expiratory wheezing on lung Continue Albuterol 2 puffs q6 h prn for SOB/wheezingexam today but has improved.  Stop Advair Start Breztri 2 puffs BID, sample provided. Can deescalate once symptoms resolved Rinse mouth after each use of Breztri  Check CBC today

## 2022-08-15 NOTE — Assessment & Plan Note (Signed)
Acute exacerbation. In no acute respiratory distress.  No signs of volume overload.  Low suspicion for PE Start Prednisone taper pack x 5 days Continue Albuterol q 4 hr x 24-48 hrs then as needed for sob or wheezing. Restart Advair 1 puff BID, rinse mouth after each use Start Zpack given subjective fever and nonproductive cough Start Probiotics daily and continue for 14 days after therapy Chest xray Follow up in 1 week or sooner if worsening    

## 2022-08-24 ENCOUNTER — Other Ambulatory Visit (HOSPITAL_COMMUNITY): Payer: Self-pay

## 2022-08-24 ENCOUNTER — Ambulatory Visit: Payer: 59 | Admitting: Family Medicine

## 2022-08-24 ENCOUNTER — Encounter: Payer: Self-pay | Admitting: Family Medicine

## 2022-09-01 ENCOUNTER — Other Ambulatory Visit: Payer: Self-pay | Admitting: Internal Medicine

## 2022-09-01 NOTE — Telephone Encounter (Signed)
Historical rx. Is it okay to refill?

## 2022-09-02 ENCOUNTER — Other Ambulatory Visit (HOSPITAL_COMMUNITY): Payer: Self-pay

## 2022-09-02 MED ORDER — FLUTICASONE PROPIONATE 50 MCG/ACT NA SUSP
1.0000 | Freq: Every day | NASAL | 3 refills | Status: DC
  Filled 2022-09-02: qty 16, 30d supply, fill #0
  Filled 2022-12-15: qty 16, 60d supply, fill #0
  Filled 2023-03-11: qty 16, 60d supply, fill #1
  Filled 2023-04-27: qty 16, 60d supply, fill #2
  Filled 2023-08-03: qty 16, 60d supply, fill #3

## 2022-09-10 ENCOUNTER — Other Ambulatory Visit (HOSPITAL_COMMUNITY): Payer: Self-pay

## 2022-09-17 ENCOUNTER — Other Ambulatory Visit: Payer: Self-pay | Admitting: Oncology

## 2022-09-17 DIAGNOSIS — Z006 Encounter for examination for normal comparison and control in clinical research program: Secondary | ICD-10-CM

## 2022-10-19 ENCOUNTER — Telehealth: Payer: 59 | Admitting: Nurse Practitioner

## 2022-10-19 DIAGNOSIS — U071 COVID-19: Secondary | ICD-10-CM

## 2022-10-19 MED ORDER — NIRMATRELVIR/RITONAVIR (PAXLOVID)TABLET: 3.0000 | ORAL_TABLET | Freq: Two times a day (BID) | ORAL | 0 refills | Status: AC

## 2022-10-19 NOTE — Progress Notes (Signed)
Virtual Visit Consent   Samantha Raymond [facility], you are scheduled for a virtual visit with a [facility] provider today. Just as with appointments in the office, your consent must be obtained to participate. Your consent will be active for this visit and any virtual visit you may have with one of our providers in the next 365 days. If you have a MyChart account, a copy of this consent can be sent to you electronically.  As this is a virtual visit, video technology does not allow for your provider to perform a traditional examination. This may limit your provider's ability to fully assess your condition. If your provider identifies any concerns that need to be evaluated in person or the need to arrange testing (such as labs, EKG, etc.), we will make arrangements to do so. Although advances in technology are sophisticated, we cannot ensure that it will always work on either your end or our end. If the connection with a video visit is poor, the visit may have to be switched to a telephone visit. With either a video or telephone visit, we are not always able to ensure that we have a secure connection.  By engaging in this virtual visit, you consent to the provision of healthcare and authorize for your insurance to be billed (if applicable) for the services provided during this visit. Depending on your insurance coverage, you may receive a charge related to this service.  I need to obtain your verbal consent now. Are you willing to proceed with your visit today? Samantha Raymond has provided verbal consent on 10/19/2022 for a virtual visit  telephone). Viviano Simas, FNP Patient is unable to connect her video, visit completed using audio only   Date: 10/19/2022 11:17 AM  Virtual Visit via Video Note   I, Viviano Simas, connected with  Samantha Raymond [facility]  ([id number], November 06, 1960) on 10/19/22 at 11:30 AM EDT by a video-enabled telemedicine application and verified that I am speaking with the correct  person using two identifiers.  Location: Patient: Virtual Visit Location Patient: Home Provider: Virtual Visit Location Provider: Home Office   I discussed the limitations of evaluation and management by telemedicine and the availability of in person appointments. The patient expressed understanding and agreed to proceed.    History of Present Illness: Samantha Raymond is a 62 y.o. who identifies as a female who was assigned female at birth, and is being seen today after testing positive for COVID at home   Symptom onset was 4 days ago  Started with a scratchy throat and nasal congested she tested negative for COVID at that time She took another test today and was positive  Increased nasal/sinus congestion today with ear pressure  Increased fatigue today as well  She has not required her rescue inhaler  Has stayed consistent with her Markus Daft  Denies wheezing    She has had COVID in the past  She has not taken Paxlovid in the past  Required steroids  She had pneumonia last June    Her husband also has COVID currently    Significant history includes: Moderate persistent asthma    Reviewed two prior GFRs 70 &74 Denies history of kidney disease   Problems:  Patient Active Problem List   Diagnosis Date Noted   Mild intermittent asthma with exacerbation 08/08/2022   Shortness of breath 08/08/2022   Class 2 severe obesity with serious comorbidity and body mass index (BMI) of 37.0 to 37.9 in adult, unspecified obesity type (HCC) 12/14/2021  Sensation of chest tightness 04/18/2021   Vitamin D deficiency 07/02/2020   B12 deficiency 07/02/2020   Other fatigue 06/18/2020   Dyspnea on exertion 06/18/2020   Vitamin B 12 deficiency 06/18/2020   Depressed mood, with emotional eating 06/18/2020   At risk for impaired metabolic function 06/18/2020   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   Stress and adjustment reaction 11/09/2019   Vertigo, aural, left 12/07/2018   Reactive airways  dysfunction syndrome, moderate persistent, uncomplicated (HCC) 11/06/2018   Prediabetes 10/30/2017   Family history of breast cancer in first degree relative 10/08/2014   Fatty liver disease, nonalcoholic 11/05/2013   Visit for preventive health examination 08/20/2012   Menopause 08/17/2012   Obesity (BMI 30-39.9) 08/09/2011   Hypothyroidism 08/09/2011    Allergies:  Allergies  Allergen Reactions   Sulfa Antibiotics Nausea And Vomiting   Medications:  Current Outpatient Medications:    albuterol (VENTOLIN HFA) 108 (90 Base) MCG/ACT inhaler, Inhale 2 puffs into the lungs every 6 hours as needed for wheezing or shortness of breath., Disp: 6.7 g, Rfl: 3   Budeson-Glycopyrrol-Formoterol (BREZTRI AEROSPHERE) 160-9-4.8 MCG/ACT AERO, Inhale 2 puffs into the lungs 2 (two) times daily., Disp: 10.7 g, Rfl: 11   buPROPion ER (WELLBUTRIN SR) 100 MG 12 hr tablet, Take 1 tablet (100 mg total) by mouth 2 (two) times daily., Disp: 180 tablet, Rfl: 1   cetirizine (ZYRTEC) 10 MG tablet, Take 10 mg by mouth daily., Disp: , Rfl:    escitalopram (LEXAPRO) 10 MG tablet, Take 1 tablet (10 mg total) by mouth daily., Disp: 90 tablet, Rfl: 1   fluticasone (FLONASE) 50 MCG/ACT nasal spray, Place 1 spray into both nostrils daily. Place into both nostrils daily., Disp: 16 g, Rfl: 3   fluticasone-salmeterol (ADVAIR) 500-50 MCG/ACT AEPB, INHALE 1 PUFF 2 TIMES DAILY INTO THE LUNGS. RINSE MOUTH AFTER EACH USE TO PREVENT THRUSH, Disp: 60 each, Rfl: 5   levothyroxine (SYNTHROID) 25 MCG tablet, Take 0.5 tablets (12.5 mcg total) by mouth daily before breakfast., Disp: 90 tablet, Rfl: 0   nystatin (MYCOSTATIN) 100000 UNIT/ML suspension, Take 5 mLs (500,000 Units total) by mouth 4 (four) times daily., Disp: 60 mL, Rfl: 0   ondansetron (ZOFRAN) 4 MG tablet, Take 1 tablet (4 mg total) by mouth every 8 (eight) hours as needed for nausea or vomiting., Disp: 20 tablet, Rfl: 0   saccharomyces boulardii (FLORASTOR) 250 MG capsule, Take  1 capsule (250 mg total) by mouth daily., Disp: 90 capsule, Rfl: 0   triamcinolone (NASACORT) 55 MCG/ACT AERO nasal inhaler, Place into the nose., Disp: 16.9 mL, Rfl: 11  Observations/Objective:  Resting comfortably  at home.  No labored breathing.  Speech is clear and coherent with logical content.  Patient is alert and oriented at baseline.    Assessment and Plan:  1. COVID-19  Take anti-viral with food  Manage symptoms with over the counter medications as discussed  Use Albuterol as needed for wheezing   - nirmatrelvir/ritonavir (PAXLOVID) 20 x 150 MG & 10 x 100MG  TABS; Take 3 tablets by mouth 2 (two) times daily for 5 days. (Take nirmatrelvir 150 mg two tablets twice daily for 5 days and ritonavir 100 mg one tablet twice daily for 5 days) Patient GFR is 70  Dispense: 30 tablet; Refill: 0     Follow Up Instructions: I discussed the assessment and treatment plan with the patient. The patient was provided an opportunity to ask questions and all were answered. The patient agreed with the plan and demonstrated an  understanding of the instructions.  A copy of instructions were sent to the patient via MyChart unless otherwise noted below.     The patient was advised to call back or seek an in-person evaluation if the symptoms worsen or if the condition fails to improve as anticipated.  Time:  I spent 14 minutes with the patient via telehealth technology discussing the above problems/concerns.    Viviano Simas, FNP

## 2022-12-15 ENCOUNTER — Other Ambulatory Visit (HOSPITAL_COMMUNITY): Payer: Self-pay

## 2022-12-15 ENCOUNTER — Other Ambulatory Visit: Payer: Self-pay | Admitting: Internal Medicine

## 2022-12-15 ENCOUNTER — Encounter: Payer: Self-pay | Admitting: Internal Medicine

## 2022-12-15 DIAGNOSIS — R7303 Prediabetes: Secondary | ICD-10-CM

## 2022-12-15 DIAGNOSIS — R5383 Other fatigue: Secondary | ICD-10-CM

## 2022-12-15 DIAGNOSIS — E785 Hyperlipidemia, unspecified: Secondary | ICD-10-CM

## 2022-12-15 DIAGNOSIS — E034 Atrophy of thyroid (acquired): Secondary | ICD-10-CM

## 2022-12-15 MED ORDER — CETIRIZINE HCL 10 MG PO TABS
10.0000 mg | ORAL_TABLET | Freq: Every day | ORAL | 1 refills | Status: DC
  Filled 2022-12-15: qty 90, 90d supply, fill #0
  Filled 2023-04-27: qty 90, 90d supply, fill #1

## 2022-12-15 MED ORDER — ESCITALOPRAM OXALATE 10 MG PO TABS
10.0000 mg | ORAL_TABLET | Freq: Every day | ORAL | 1 refills | Status: DC
  Filled 2022-12-15: qty 90, 90d supply, fill #0
  Filled 2023-03-11: qty 90, 90d supply, fill #1

## 2022-12-15 NOTE — Telephone Encounter (Signed)
Historical medication is it okay to refill?

## 2022-12-15 NOTE — Telephone Encounter (Signed)
I have pended labs for your approval if okay I will get pt scheduled for a lab appt.

## 2022-12-16 ENCOUNTER — Other Ambulatory Visit (HOSPITAL_COMMUNITY): Payer: Self-pay

## 2022-12-16 ENCOUNTER — Other Ambulatory Visit: Payer: Self-pay

## 2022-12-17 NOTE — Telephone Encounter (Signed)
Labs signed

## 2022-12-22 ENCOUNTER — Other Ambulatory Visit: Payer: Self-pay

## 2022-12-22 ENCOUNTER — Other Ambulatory Visit (HOSPITAL_COMMUNITY)
Admission: RE | Admit: 2022-12-22 | Discharge: 2022-12-22 | Disposition: A | Discharge status: Home / Self Care | Payer: 59 | Source: Ambulatory Visit | Attending: Internal Medicine | Admitting: Internal Medicine

## 2022-12-22 ENCOUNTER — Ambulatory Visit (INDEPENDENT_AMBULATORY_CARE_PROVIDER_SITE_OTHER): Payer: 59 | Admitting: Internal Medicine

## 2022-12-22 ENCOUNTER — Other Ambulatory Visit (HOSPITAL_COMMUNITY): Payer: Self-pay

## 2022-12-22 ENCOUNTER — Encounter: Payer: Self-pay | Admitting: Internal Medicine

## 2022-12-22 VITALS — BP 120/74 | HR 76 | Ht 62.75 in | Wt 205.6 lb

## 2022-12-22 DIAGNOSIS — Z6837 Body mass index (BMI) 37.0-37.9, adult: Secondary | ICD-10-CM

## 2022-12-22 DIAGNOSIS — R7303 Prediabetes: Secondary | ICD-10-CM | POA: Diagnosis not present

## 2022-12-22 DIAGNOSIS — F32A Depression, unspecified: Secondary | ICD-10-CM | POA: Diagnosis not present

## 2022-12-22 DIAGNOSIS — Z0000 Encounter for general adult medical examination without abnormal findings: Secondary | ICD-10-CM

## 2022-12-22 DIAGNOSIS — E034 Atrophy of thyroid (acquired): Secondary | ICD-10-CM | POA: Diagnosis not present

## 2022-12-22 DIAGNOSIS — E785 Hyperlipidemia, unspecified: Secondary | ICD-10-CM

## 2022-12-22 DIAGNOSIS — Z124 Encounter for screening for malignant neoplasm of cervix: Secondary | ICD-10-CM

## 2022-12-22 DIAGNOSIS — Z23 Encounter for immunization: Secondary | ICD-10-CM

## 2022-12-22 DIAGNOSIS — E66812 Obesity, class 2: Secondary | ICD-10-CM | POA: Diagnosis not present

## 2022-12-22 DIAGNOSIS — Z1231 Encounter for screening mammogram for malignant neoplasm of breast: Secondary | ICD-10-CM

## 2022-12-22 MED ORDER — BUPROPION HCL ER (SR) 100 MG PO TB12
100.0000 mg | ORAL_TABLET | Freq: Two times a day (BID) | ORAL | 1 refills | Status: DC
  Filled 2022-12-22: qty 180, 90d supply, fill #0

## 2022-12-22 MED ORDER — LEVOTHYROXINE SODIUM 25 MCG PO TABS
12.5000 ug | ORAL_TABLET | Freq: Every day | ORAL | 0 refills | Status: DC
  Filled 2022-12-22: qty 45, 90d supply, fill #0
  Filled 2023-03-11: qty 45, 90d supply, fill #1

## 2022-12-22 NOTE — Assessment & Plan Note (Signed)

## 2022-12-22 NOTE — Assessment & Plan Note (Signed)
I spent 15 minutes addressing  BMI and recommended wt loss of 10% of body weigh over the next 6 months using a low glycemic index diet and regular exercise a minimum of 5 days per week.

## 2022-12-22 NOTE — Progress Notes (Signed)
Patient ID: Samantha Raymond, female    DOB: [birth year]  Age: 62 y.o. MRN: [id number]  The patient is here for annual preventive examination and management of other chronic and acute problems.   The risk factors are reflected in the social history.   The roster of all physicians providing medical care to patient - is listed in the Snapshot section of the chart.   Activities of daily living:  The patient is 100% independent in all ADLs: dressing, toileting, feeding as well as independent mobility   Home safety : The patient has smoke detectors in the home. They wear seatbelts.  There are no unsecured firearms at home. There is no violence in the home.    There is no risks for hepatitis, STDs or HIV. There is no   history of blood transfusion. They have no travel history to infectious disease endemic areas of the world.   The patient has seen their dentist in the last six month. They have seen their eye doctor in the last year. The patinet  denies slight hearing difficulty with regard to whispered voices and some television programs.  They have deferred audiologic testing in the last year.  They do not  have excessive sun exposure. Discussed the need for sun protection: hats, long sleeves and use of sunscreen if there is significant sun exposure.    Diet: the importance of a healthy diet is discussed. They do have a healthy diet.   The benefits of regular aerobic exercise were discussed. The patient  exercises  3 to 5 days per week  for  30 minutes.    Depression screen: there are no signs or vegative symptoms of depression- irritability, change in appetite, anhedonia, sadness/tearfullness.   The following portions of the patient's history were reviewed and updated as appropriate: allergies, current medications, past family history, past medical history,  past surgical history, past social history  and problem list.   Visual acuity was not assessed per patient preference since the patient  has regular follow up with an  ophthalmologist. Hearing and body mass index were assessed and reviewed.    During the course of the visit the patient was educated and counseled about appropriate screening and preventive services including : fall prevention , diabetes screening, nutrition counseling, colorectal cancer screening, and recommended immunizations.    Chief Complaint:  Stress:  Family doing well.  Marlene Bast is cancer free fr one year  .  Work is less stressful.      Review of Symptoms  Patient denies headache, fevers, malaise, unintentional weight loss, skin rash, eye pain, sinus congestion and sinus pain, sore throat, dysphagia,  hemoptysis , cough, dyspnea, wheezing, chest pain, palpitations, orthopnea, edema, abdominal pain, nausea, melena, diarrhea, constipation, flank pain, dysuria, hematuria, urinary  Frequency, nocturia, numbness, tingling, seizures,  Focal weakness, Loss of consciousness,  Tremor, insomnia, depression, anxiety, and suicidal ideation.    Physical Exam:  BP 120/74   Pulse 76   Ht 5' 2.75" (1.594 m)   Wt 205 lb 9.6 oz (93.3 kg)   SpO2 96%   BMI 36.71 kg/m    Physical Exam Vitals reviewed.  Constitutional:      General: She is not in acute distress.    Appearance: Normal appearance. She is well-developed and normal weight. She is not ill-appearing, toxic-appearing or diaphoretic.  HENT:     Head: Normocephalic.     Right Ear: Tympanic membrane, ear canal and external ear normal. There is no impacted cerumen.  Left Ear: Tympanic membrane, ear canal and external ear normal. There is no impacted cerumen.     Nose: Nose normal.     Mouth/Throat:     Mouth: Mucous membranes are moist.     Pharynx: Oropharynx is clear.  Eyes:     General: No scleral icterus.       Right eye: No discharge.        Left eye: No discharge.     Conjunctiva/sclera: Conjunctivae normal.     Pupils: Pupils are equal, round, and reactive to light.  Neck:     Thyroid: No  thyromegaly.     Vascular: No carotid bruit or JVD.  Cardiovascular:     Rate and Rhythm: Normal rate and regular rhythm.     Heart sounds: Normal heart sounds.  Pulmonary:     Effort: Pulmonary effort is normal. No respiratory distress.     Breath sounds: Normal breath sounds.  Chest:  Breasts:    Breasts are symmetrical.     Right: Normal. No swelling, inverted nipple, mass, nipple discharge, skin change or tenderness.     Left: Normal. No swelling, inverted nipple, mass, nipple discharge, skin change or tenderness.  Abdominal:     General: Bowel sounds are normal.     Palpations: Abdomen is soft. There is no mass.     Tenderness: There is no abdominal tenderness. There is no guarding or rebound.     Hernia: There is no hernia in the left inguinal area or right inguinal area.  Genitourinary:    Exam position: Lithotomy position.     Pubic Area: No rash or pubic lice.      Labia:        Right: No rash, tenderness, lesion or injury.        Left: No rash, tenderness, lesion or injury.      Vagina: Normal.     Cervix: Normal.     Uterus: Normal.      Adnexa: Right adnexa normal and left adnexa normal.  Musculoskeletal:        General: Normal range of motion.     Cervical back: Normal range of motion and neck supple.  Lymphadenopathy:     Cervical: No cervical adenopathy.     Upper Body:     Right upper body: No supraclavicular, axillary or pectoral adenopathy.     Left upper body: No supraclavicular, axillary or pectoral adenopathy.     Lower Body: No right inguinal adenopathy. No left inguinal adenopathy.  Skin:    General: Skin is warm and dry.  Neurological:     General: No focal deficit present.     Mental Status: She is alert and oriented to person, place, and time. Mental status is at baseline.  Psychiatric:        Mood and Affect: Mood normal.        Behavior: Behavior normal.        Thought Content: Thought content normal.        Judgment: Judgment normal.      Assessment and Plan: Encounter for screening mammogram for malignant neoplasm of breast -     3D Screening Mammogram, Left and Right; Future  Depression, unspecified - Emotional eating -     buPROPion HCl ER (SR); Take 1 tablet (100 mg total) by mouth 2 (two) times daily.  Dispense: 180 tablet; Refill: 1  Visit for preventive health examination Assessment & Plan: age appropriate education and counseling updated, referrals for preventative  services and immunizations addressed, dietary and smoking counseling addressed, most recent labs reviewed.  I have personally reviewed and have noted:   1) the patient's medical and social history 2) The pt's use of alcohol, tobacco, and illicit drugs 3) The patient's current medications and supplements 4) Functional ability including ADL's, fall risk, home safety risk, hearing and visual impairment 5) Diet and physical activities 6) Evidence for depression or mood disorder 7) The patient's height, weight, and BMI have been recorded in the chart  I have made referrals, and provided counseling and education based on review of the above    Cervical cancer screening -     Cytology - PAP  Need for influenza vaccination  Need for Tdap vaccination -     Tdap vaccine greater than or equal to 7yo IM  Prediabetes -     Microalbumin / creatinine urine ratio -     Comprehensive metabolic panel -     Hemoglobin A1c  Hyperlipidemia, unspecified hyperlipidemia type -     LDL cholesterol, direct -     Lipid panel  Hypothyroidism due to acquired atrophy of thyroid -     TSH  Class 2 severe obesity with serious comorbidity and body mass index (BMI) of 37.0 to 37.9 in adult, unspecified obesity type (HCC) Assessment & Plan: I spent 15 minutes addressing  BMI and recommended wt loss of 10% of body weigh over the next 6 months using a low glycemic index diet and regular exercise a minimum of 5 days per week.    Other orders -     Levothyroxine  Sodium; Take 0.5 tablets (12.5 mcg total) by mouth daily before breakfast.  Dispense: 90 tablet; Refill: 0    No follow-ups on file.  Sherlene Shams, MD

## 2022-12-22 NOTE — Patient Instructions (Signed)
You received your TdaP vaccine today  I recommend you consider getting the RSV vaccine in 2 weeks at your pharmacy    If you decide you want to try one of the GLP 1 agonists for weight management:  Here are some local and online Sources for zepbound (tirzepitide)  and Wegovy (semiglutide)   (direct pay)  [facility] FOR [facility] [place], [place] - [address]  Isaac Bliss  Drug store in [place] makes a compounded version of semiglutide [employer] (online  GLP)  [facility]ily[facility] Cooter [facility]:  (Online GLP )  Delrae Rend MD in [place] also provides  compounded semiglutide  through a [employer] program

## 2022-12-23 LAB — MICROALBUMIN / CREATININE URINE RATIO
Creatinine,U: 129.1 mg/dL
Microalb Creat Ratio: 0.5 mg/g (ref 0.0–30.0)
Microalb, Ur: <0.7 mg/dL (ref 0.0–1.9)

## 2022-12-23 LAB — LIPID PANEL
Cholesterol: 196 mg/dL (ref 0–200)
HDL: 54.9 mg/dL (ref >39.00)
LDL Cholesterol: 89 mg/dL (ref 0–99)
NonHDL: 140.6
Total CHOL/HDL Ratio: 4
Triglycerides: 256 mg/dL — ABNORMAL HIGH (ref 0.0–149.0)
VLDL: 51.2 mg/dL — ABNORMAL HIGH (ref 0.0–40.0)

## 2022-12-23 LAB — COMPREHENSIVE METABOLIC PANEL
ALT: 34 U/L (ref 0–35)
AST: 24 U/L (ref 0–37)
Albumin: 4.2 g/dL (ref 3.5–5.2)
Alkaline Phosphatase: 95 U/L (ref 39–117)
BUN: 17 mg/dL (ref 6–23)
CO2: 29 meq/L (ref 19–32)
Calcium: 9.3 mg/dL (ref 8.4–10.5)
Chloride: 102 meq/L (ref 96–112)
Creatinine, Ser: 0.91 mg/dL (ref 0.40–1.20)
GFR: 67.03 mL/min (ref >60.00)
Glucose, Bld: 92 mg/dL (ref 70–99)
Potassium: 4 meq/L (ref 3.5–5.1)
Sodium: 139 meq/L (ref 135–145)
Total Bilirubin: 0.5 mg/dL (ref 0.2–1.2)
Total Protein: 6.3 g/dL (ref 6.0–8.3)

## 2022-12-23 LAB — LDL CHOLESTEROL, DIRECT: Direct LDL: 107 mg/dL

## 2022-12-23 LAB — TSH: TSH: 0.87 u[IU]/mL (ref 0.35–5.50)

## 2022-12-23 LAB — HEMOGLOBIN A1C: Hgb A1c MFr Bld: 5.9 % (ref 4.6–6.5)

## 2022-12-25 LAB — CYTOLOGY - PAP
Comment: NEGATIVE
Diagnosis: NEGATIVE
High risk HPV: NEGATIVE

## 2023-01-22 ENCOUNTER — Encounter: Payer: Self-pay | Admitting: Internal Medicine

## 2023-02-16 ENCOUNTER — Encounter: Payer: Self-pay | Admitting: Internal Medicine

## 2023-02-17 ENCOUNTER — Other Ambulatory Visit (HOSPITAL_COMMUNITY): Payer: Self-pay

## 2023-02-17 MED ORDER — TIRZEPATIDE-WEIGHT MANAGEMENT 5 MG/0.5ML ~~LOC~~ SOLN
5.0000 mg | SUBCUTANEOUS | 2 refills | Status: DC
  Filled 2023-02-17: qty 2, 28d supply, fill #0

## 2023-02-17 NOTE — Addendum Note (Signed)
 Addended by: Sherlene Shams on: 02/17/2023 01:28 PM   Modules accepted: Orders

## 2023-02-18 ENCOUNTER — Other Ambulatory Visit: Payer: Self-pay | Admitting: Internal Medicine

## 2023-02-18 ENCOUNTER — Other Ambulatory Visit: Payer: Self-pay

## 2023-02-18 ENCOUNTER — Other Ambulatory Visit (HOSPITAL_COMMUNITY): Payer: Self-pay

## 2023-02-18 MED ORDER — ZEPBOUND 5 MG/0.5ML ~~LOC~~ SOAJ
5.0000 mg | SUBCUTANEOUS | 2 refills | Status: DC
  Filled 2023-02-18 – 2023-02-24 (×2): qty 2, 28d supply, fill #0

## 2023-02-18 NOTE — Telephone Encounter (Signed)
 Is it okay to send in the name brand Zepbound?

## 2023-02-19 ENCOUNTER — Other Ambulatory Visit (HOSPITAL_COMMUNITY): Payer: Self-pay

## 2023-02-24 ENCOUNTER — Other Ambulatory Visit: Payer: Self-pay

## 2023-02-24 ENCOUNTER — Other Ambulatory Visit (HOSPITAL_COMMUNITY): Payer: Self-pay

## 2023-02-26 MED ORDER — ZEPBOUND 5 MG/0.5ML ~~LOC~~ SOLN: 5.0000 mg | SUBCUTANEOUS | 1 refills | Status: DC

## 2023-02-26 MED ORDER — ZEPBOUND 5 MG/0.5ML ~~LOC~~ SOAJ: 5.0000 mg | SUBCUTANEOUS | 2 refills | Status: DC

## 2023-02-26 MED ORDER — ZEPBOUND 2.5 MG/0.5ML ~~LOC~~ SOAJ: 2.5000 mg | SUBCUTANEOUS | 2 refills | Status: DC

## 2023-02-26 NOTE — Addendum Note (Signed)
 Addended by: Sherlene Shams on: 02/26/2023 12:49 PM   Modules accepted: Orders

## 2023-02-26 NOTE — Addendum Note (Signed)
 Addended by: Sandy Salaam on: 02/26/2023 02:50 PM   Modules accepted: Orders

## 2023-03-02 ENCOUNTER — Other Ambulatory Visit: Payer: Self-pay

## 2023-03-02 MED ORDER — ZEPBOUND 5 MG/0.5ML ~~LOC~~ SOLN: 5.0000 mg | SUBCUTANEOUS | 1 refills | Status: DC

## 2023-03-11 ENCOUNTER — Other Ambulatory Visit (HOSPITAL_COMMUNITY): Payer: Self-pay

## 2023-03-11 ENCOUNTER — Other Ambulatory Visit: Payer: Self-pay

## 2023-03-11 ENCOUNTER — Other Ambulatory Visit: Payer: Self-pay | Admitting: Internal Medicine

## 2023-03-11 MED ORDER — RSVPREF3 VAC RECOMB ADJUVANTED 120 MCG/0.5ML IM SUSR
0.5000 mL | Freq: Once | INTRAMUSCULAR | 1 refills | Status: AC
  Filled 2023-03-11: qty 1, 1d supply, fill #0

## 2023-03-12 ENCOUNTER — Other Ambulatory Visit (HOSPITAL_COMMUNITY): Payer: Self-pay

## 2023-03-12 ENCOUNTER — Other Ambulatory Visit: Payer: Self-pay

## 2023-03-16 ENCOUNTER — Other Ambulatory Visit (HOSPITAL_COMMUNITY): Payer: Self-pay

## 2023-03-31 ENCOUNTER — Other Ambulatory Visit: Payer: Self-pay | Admitting: *Deleted

## 2023-03-31 MED ORDER — RSVPREF3 VAC RECOMB ADJUVANTED 120 MCG/0.5ML IM SUSR
0.5000 mL | Freq: Once | INTRAMUSCULAR | 0 refills | Status: AC
  Filled 2023-03-31 – 2023-04-02 (×2): qty 0.5, 1d supply, fill #0

## 2023-03-31 NOTE — Telephone Encounter (Signed)
Copied from CRM [id number]. Topic: Clinical - Prescription Issue >> Mar 31, 2023 11:50 AM [place] A wrote: Reason for CRM: Patient called to follow-up on the RSP vaccination. She said she called the pharmacy and was told that script needs to be sent. Patient said she is using [facility] for injection

## 2023-03-31 NOTE — Telephone Encounter (Signed)
I have pended the rx for your approval.

## 2023-04-01 ENCOUNTER — Other Ambulatory Visit (HOSPITAL_COMMUNITY): Payer: Self-pay

## 2023-04-01 DIAGNOSIS — L821 Other seborrheic keratosis: Secondary | ICD-10-CM | POA: Diagnosis not present

## 2023-04-01 DIAGNOSIS — D485 Neoplasm of uncertain behavior of skin: Secondary | ICD-10-CM | POA: Diagnosis not present

## 2023-04-01 DIAGNOSIS — D225 Melanocytic nevi of trunk: Secondary | ICD-10-CM | POA: Diagnosis not present

## 2023-04-01 DIAGNOSIS — D045 Carcinoma in situ of skin of trunk: Secondary | ICD-10-CM | POA: Diagnosis not present

## 2023-04-01 DIAGNOSIS — D2261 Melanocytic nevi of right upper limb, including shoulder: Secondary | ICD-10-CM | POA: Diagnosis not present

## 2023-04-01 DIAGNOSIS — D2271 Melanocytic nevi of right lower limb, including hip: Secondary | ICD-10-CM | POA: Diagnosis not present

## 2023-04-01 DIAGNOSIS — D2272 Melanocytic nevi of left lower limb, including hip: Secondary | ICD-10-CM | POA: Diagnosis not present

## 2023-04-01 DIAGNOSIS — D2262 Melanocytic nevi of left upper limb, including shoulder: Secondary | ICD-10-CM | POA: Diagnosis not present

## 2023-04-02 ENCOUNTER — Other Ambulatory Visit: Payer: Self-pay

## 2023-04-23 ENCOUNTER — Ambulatory Visit
Admission: RE | Admit: 2023-04-23 | Discharge: 2023-04-23 | Disposition: A | Discharge status: Home / Self Care | Payer: Commercial Managed Care - PPO | Source: Ambulatory Visit | Attending: Internal Medicine | Admitting: Internal Medicine

## 2023-04-23 DIAGNOSIS — Z1231 Encounter for screening mammogram for malignant neoplasm of breast: Secondary | ICD-10-CM | POA: Diagnosis not present

## 2023-04-27 ENCOUNTER — Other Ambulatory Visit (HOSPITAL_COMMUNITY): Payer: Self-pay

## 2023-05-06 ENCOUNTER — Other Ambulatory Visit (HOSPITAL_COMMUNITY): Payer: Self-pay

## 2023-05-06 ENCOUNTER — Other Ambulatory Visit: Payer: Self-pay

## 2023-05-06 MED ORDER — TRETINOIN 0.025 % EX CREA
TOPICAL_CREAM | CUTANEOUS | 4 refills | Status: AC
  Filled 2023-05-06: qty 20, 30d supply, fill #0
  Filled 2023-06-10: qty 20, 30d supply, fill #1
  Filled 2023-09-30: qty 20, 30d supply, fill #2
  Filled 2023-12-24: qty 20, 30d supply, fill #3
  Filled 2024-03-24: qty 20, 30d supply, fill #4

## 2023-06-10 ENCOUNTER — Other Ambulatory Visit: Payer: Self-pay | Admitting: Internal Medicine

## 2023-06-10 MED ORDER — ESCITALOPRAM OXALATE 10 MG PO TABS
10.0000 mg | ORAL_TABLET | Freq: Every day | ORAL | 0 refills | Status: DC
  Filled 2023-06-10: qty 30, 30d supply, fill #0

## 2023-06-11 ENCOUNTER — Other Ambulatory Visit (HOSPITAL_COMMUNITY): Payer: Self-pay

## 2023-06-11 ENCOUNTER — Other Ambulatory Visit: Payer: Self-pay

## 2023-06-11 ENCOUNTER — Other Ambulatory Visit: Payer: Self-pay | Admitting: Internal Medicine

## 2023-06-11 ENCOUNTER — Telehealth: Admitting: Internal Medicine

## 2023-06-11 ENCOUNTER — Encounter: Payer: Self-pay | Admitting: Internal Medicine

## 2023-06-11 VITALS — Ht 62.75 in | Wt 202.0 lb

## 2023-06-11 DIAGNOSIS — E669 Obesity, unspecified: Secondary | ICD-10-CM

## 2023-06-11 DIAGNOSIS — E559 Vitamin D deficiency, unspecified: Secondary | ICD-10-CM

## 2023-06-11 DIAGNOSIS — E039 Hypothyroidism, unspecified: Secondary | ICD-10-CM

## 2023-06-11 DIAGNOSIS — K760 Fatty (change of) liver, not elsewhere classified: Secondary | ICD-10-CM

## 2023-06-11 DIAGNOSIS — E538 Deficiency of other specified B group vitamins: Secondary | ICD-10-CM

## 2023-06-11 DIAGNOSIS — R7303 Prediabetes: Secondary | ICD-10-CM

## 2023-06-11 NOTE — Patient Instructions (Signed)
 Here are some local and online Sources for zepbound and [phone] (direct pay)   [facility] PA[facility] FOR ZEPBOUND VIAL [place], [place] - [phone]  Isaac Bliss  Drug store in [place] makes a compounded version of semiglutide [employer] (online  GLP)  Sherilyn Cooter Meds:  (Online GLP )  Delrae Rend MD in [place] also provides medication through a [employer] program

## 2023-06-11 NOTE — Progress Notes (Signed)
 Virtual Visit via Caregility   Note   This format is felt to be most appropriate for this patient at this time.  All issues noted in this document were discussed and addressed.  No physical exam was performed (except for noted visual exam findings with Video Visits).   I connected with Floy  on 06/12/23 at  1:30 PM EDT by a video enabled telemedicine application and verified that I am speaking with the correct person using two identifiers. Location patient: home Location provider: work or home office Persons participating in the virtual visit: patient, provider  I discussed the limitations, risks, security and privacy concerns of performing an evaluation and management service by telephone and the availability of in person appointments. I also discussed with the patient that there may be a patient responsible charge related to this service. The patient expressed understanding and agreed to proceed.   Reason for visit: weight management   HPI:   Weight management :  Samantha Raymond has been  taking 7.5 mg Zepbound ,  but has increased the interval between doses due to cost, and her weight  loss has been less than expected (4 lbs lost since November).  She has not been exercising regularly but has no barriers to exercising. She is eating 2 meals daily . Diet reviewed    ROS: See pertinent positives and negatives per HPI.  Past Medical History:  Diagnosis Date   Allergy    Asthma    Fatty liver    hypothyroid    Infertility, female    Menopause    Prediabetes    Premature ovarian failure    Recurrent urinary tract infection    SOBOE (shortness of breath on exertion)     Past Surgical History:  Procedure Laterality Date   CESAREAN SECTION     COLONOSCOPY      Family History  Problem Relation Age of Onset   Asthma Mother    Obesity Mother    Diabetes Father    Heart disease Father        chf,  no history of AMI   Hyperlipidemia Father    Hypertension Father    Kidney disease  Father    Arthritis Sister    Cancer Sister 73       breast cancer , second diagnosis at 56   Breast cancer Sister 67   Asthma Brother    Stroke Maternal Aunt    Breast cancer Maternal Aunt    Cancer Maternal Aunt    Cancer Maternal Aunt        breast ca   Alcohol abuse Maternal Grandfather    Stroke Maternal Aunt    Colon cancer Neg Hx    Colon polyps Neg Hx    Esophageal cancer Neg Hx    Rectal cancer Neg Hx    Stomach cancer Neg Hx     SOCIAL HX: supraclavicular reports that she has never smoked. She has never used smokeless tobacco. She reports current alcohol use of about 1.0 standard drink of alcohol per week. She reports that she does not use drugs.    Current Outpatient Medications:    albuterol  (VENTOLIN  HFA) 108 (90 Base) MCG/ACT inhaler, Inhale 2 puffs into the lungs every 6 hours as needed for wheezing or shortness of breath., Disp: 6.7 g, Rfl: 3   buPROPion  ER (WELLBUTRIN  SR) 100 MG 12 hr tablet, Take 1 tablet (100 mg total) by mouth 2 (two) times daily. (Patient taking differently: Take 100 mg by mouth  daily.), Disp: 180 tablet, Rfl: 1   cetirizine  (ZYRTEC ) 10 MG tablet, Take 1 tablet (10 mg total) by mouth daily., Disp: 90 tablet, Rfl: 1   escitalopram  (LEXAPRO ) 10 MG tablet, Take 1 tablet (10 mg total) by mouth daily., Disp: 30 tablet, Rfl: 0   fluticasone  (FLONASE ) 50 MCG/ACT nasal spray, Place 1 spray into both nostrils daily. Place into both nostrils daily., Disp: 16 g, Rfl: 3   fluticasone -salmeterol (ADVAIR ) 500-50 MCG/ACT AEPB, INHALE 1 PUFF 2 TIMES DAILY INTO THE LUNGS. RINSE MOUTH AFTER EACH USE TO PREVENT THRUSH, Disp: 60 each, Rfl: 5   levothyroxine  (SYNTHROID ) 25 MCG tablet, Take 0.5 tablets (12.5 mcg total) by mouth daily before breakfast., Disp: 90 tablet, Rfl: 0   ondansetron  (ZOFRAN ) 4 MG tablet, Take 1 tablet (4 mg total) by mouth every 8 (eight) hours as needed for nausea or vomiting., Disp: 20 tablet, Rfl: 0   tirzepatide  (ZEPBOUND ) 5 MG/0.5ML Pen,  Inject 5 mg into the skin once a week., Disp: 2 mL, Rfl: 2   tretinoin  (RETIN-A ) 0.025 % cream, Apply a pea size amount to completely dry skin every other night with a moisturizer on top, increasing to nightly as tolerated., Disp: 20 g, Rfl: 4  EXAM:  VITALS per patient if applicable:  GENERAL: alert, oriented, appears well and in no acute distress  HEENT: atraumatic, conjunttiva clear, no obvious abnormalities on inspection of external nose and ears  NECK: normal movements of the head and neck  LUNGS: on inspection no signs of respiratory distress, breathing rate appears normal, no obvious gross SOB, gasping or wheezing  CV: no obvious cyanosis  MS: moves all visible extremities without noticeable abnormality  PSYCH/NEURO: pleasant and cooperative, no obvious depression or anxiety, speech and thought processing grossly intact  ASSESSMENT AND PLAN: Obesity (BMI 30-39.9) Assessment & Plan:  She  has been using Zepbound   7.5 mg dose  but rationing he dose  due to cost. .  Reviewed past use of phentermine , which I am no longer prescribing.  Strongly encouraged to start exercising        I discussed the assessment and treatment plan with the patient. The patient was provided an opportunity to ask questions and all were answered. The patient agreed with the plan and demonstrated an understanding of the instructions.   The patient was advised to call back or seek an in-person evaluation if the symptoms worsen or if the condition fails to improve as anticipated.   I spent  20 minutes dedicated to the care of this patient on the date of this encounter to include pre-visit review of her medical history,  Face-to-face time with the patient , and post visit ordering of testing and therapeutics.    Thersia Flax, MD

## 2023-06-12 MED ORDER — ESCITALOPRAM OXALATE 10 MG PO TABS
10.0000 mg | ORAL_TABLET | Freq: Every day | ORAL | 0 refills | Status: DC
  Filled 2023-06-12 – 2023-09-30 (×2): qty 30, 30d supply, fill #0

## 2023-06-12 MED ORDER — LEVOTHYROXINE SODIUM 25 MCG PO TABS
12.5000 ug | ORAL_TABLET | Freq: Every day | ORAL | 0 refills | Status: DC
  Filled 2023-06-12: qty 45, 90d supply, fill #0

## 2023-06-12 NOTE — Assessment & Plan Note (Signed)
 She  has been using Zepbound   7.5 mg dose  but rationing he dose  due to cost. .  Reviewed past use of phentermine , which I am no longer prescribing.  Strongly encouraged to start exercising

## 2023-06-14 ENCOUNTER — Other Ambulatory Visit

## 2023-06-14 ENCOUNTER — Other Ambulatory Visit (INDEPENDENT_AMBULATORY_CARE_PROVIDER_SITE_OTHER)

## 2023-06-14 ENCOUNTER — Other Ambulatory Visit: Payer: Self-pay

## 2023-06-14 ENCOUNTER — Other Ambulatory Visit (HOSPITAL_COMMUNITY): Payer: Self-pay

## 2023-06-14 DIAGNOSIS — R7303 Prediabetes: Secondary | ICD-10-CM

## 2023-06-14 DIAGNOSIS — K760 Fatty (change of) liver, not elsewhere classified: Secondary | ICD-10-CM

## 2023-06-14 DIAGNOSIS — E039 Hypothyroidism, unspecified: Secondary | ICD-10-CM

## 2023-06-15 ENCOUNTER — Encounter: Payer: Self-pay | Admitting: Internal Medicine

## 2023-06-15 LAB — COMPREHENSIVE METABOLIC PANEL WITH GFR
ALT: 22 IU/L (ref 0–32)
AST: 23 IU/L (ref 0–40)
Albumin: 4.6 g/dL (ref 3.9–4.9)
Alkaline Phosphatase: 100 IU/L (ref 44–121)
BUN/Creatinine Ratio: 15 (ref 12–28)
BUN: 15 mg/dL (ref 8–27)
Bilirubin Total: 0.5 mg/dL (ref 0.0–1.2)
CO2: 23 mmol/L (ref 20–29)
Calcium: 9.5 mg/dL (ref 8.7–10.3)
Chloride: 105 mmol/L (ref 96–106)
Creatinine, Ser: 1 mg/dL (ref 0.57–1.00)
Globulin, Total: 2 g/dL (ref 1.5–4.5)
Glucose: 97 mg/dL (ref 70–99)
Potassium: 5 mmol/L (ref 3.5–5.2)
Sodium: 142 mmol/L (ref 134–144)
Total Protein: 6.6 g/dL (ref 6.0–8.5)
eGFR: 63 mL/min/{1.73_m2} (ref >59)

## 2023-06-15 LAB — THYROID PANEL WITH TSH
Free Thyroxine Index: 2.5 (ref 1.2–4.9)
T3 Uptake Ratio: 27 % (ref 24–39)
T4, Total: 9.3 ug/dL (ref 4.5–12.0)
TSH: 0.979 u[IU]/mL (ref 0.450–4.500)

## 2023-06-15 LAB — LIPID PANEL
Chol/HDL Ratio: 3.4 ratio (ref 0.0–4.4)
Cholesterol, Total: 197 mg/dL (ref 100–199)
HDL: 58 mg/dL (ref >39)
LDL Chol Calc (NIH): 120 mg/dL — ABNORMAL HIGH (ref 0–99)
Triglycerides: 106 mg/dL (ref 0–149)
VLDL Cholesterol Cal: 19 mg/dL (ref 5–40)

## 2023-06-15 LAB — LDL CHOLESTEROL, DIRECT: LDL Direct: 120 mg/dL — ABNORMAL HIGH (ref 0–99)

## 2023-06-15 LAB — HEMOGLOBIN A1C
Est. average glucose Bld gHb Est-mCnc: 117 mg/dL
Hgb A1c MFr Bld: 5.7 % — ABNORMAL HIGH (ref 4.8–5.6)

## 2023-08-03 ENCOUNTER — Other Ambulatory Visit: Payer: Self-pay | Admitting: Internal Medicine

## 2023-08-04 ENCOUNTER — Other Ambulatory Visit: Payer: Self-pay

## 2023-08-04 ENCOUNTER — Other Ambulatory Visit (HOSPITAL_COMMUNITY): Payer: Self-pay

## 2023-08-04 ENCOUNTER — Other Ambulatory Visit (HOSPITAL_BASED_OUTPATIENT_CLINIC_OR_DEPARTMENT_OTHER): Payer: Self-pay

## 2023-08-04 MED ORDER — CETIRIZINE HCL 10 MG PO TABS
10.0000 mg | ORAL_TABLET | Freq: Every day | ORAL | 1 refills | Status: DC
  Filled 2023-08-04: qty 90, 90d supply, fill #0
  Filled 2023-12-24: qty 90, 90d supply, fill #1

## 2023-09-30 ENCOUNTER — Other Ambulatory Visit: Payer: Self-pay | Admitting: Internal Medicine

## 2023-09-30 ENCOUNTER — Other Ambulatory Visit: Payer: Self-pay

## 2023-10-01 ENCOUNTER — Other Ambulatory Visit: Payer: Self-pay

## 2023-10-01 ENCOUNTER — Other Ambulatory Visit (HOSPITAL_COMMUNITY): Payer: Self-pay

## 2023-10-01 MED ORDER — FLUTICASONE PROPIONATE 50 MCG/ACT NA SUSP
1.0000 | Freq: Every day | NASAL | 0 refills | Status: DC
  Filled 2023-10-01: qty 16, 60d supply, fill #0

## 2023-10-04 ENCOUNTER — Other Ambulatory Visit: Payer: Self-pay

## 2023-11-11 ENCOUNTER — Other Ambulatory Visit: Payer: Self-pay

## 2023-11-11 DIAGNOSIS — Z85828 Personal history of other malignant neoplasm of skin: Secondary | ICD-10-CM | POA: Diagnosis not present

## 2023-11-11 DIAGNOSIS — L820 Inflamed seborrheic keratosis: Secondary | ICD-10-CM | POA: Diagnosis not present

## 2023-11-11 DIAGNOSIS — L2989 Other pruritus: Secondary | ICD-10-CM | POA: Diagnosis not present

## 2023-11-11 DIAGNOSIS — L570 Actinic keratosis: Secondary | ICD-10-CM | POA: Diagnosis not present

## 2023-11-11 DIAGNOSIS — D2261 Melanocytic nevi of right upper limb, including shoulder: Secondary | ICD-10-CM | POA: Diagnosis not present

## 2023-11-11 DIAGNOSIS — L821 Other seborrheic keratosis: Secondary | ICD-10-CM | POA: Diagnosis not present

## 2023-11-11 DIAGNOSIS — D225 Melanocytic nevi of trunk: Secondary | ICD-10-CM | POA: Diagnosis not present

## 2023-11-11 DIAGNOSIS — D2272 Melanocytic nevi of left lower limb, including hip: Secondary | ICD-10-CM | POA: Diagnosis not present

## 2023-11-11 DIAGNOSIS — D2262 Melanocytic nevi of left upper limb, including shoulder: Secondary | ICD-10-CM | POA: Diagnosis not present

## 2023-11-11 DIAGNOSIS — D485 Neoplasm of uncertain behavior of skin: Secondary | ICD-10-CM | POA: Diagnosis not present

## 2023-11-11 MED ORDER — CLOBETASOL PROPIONATE 0.05 % EX SOLN
CUTANEOUS | 1 refills | Status: DC
  Filled 2023-11-11: qty 50, 30d supply, fill #0

## 2023-11-16 ENCOUNTER — Other Ambulatory Visit: Payer: Self-pay

## 2023-11-16 ENCOUNTER — Other Ambulatory Visit: Payer: Self-pay | Admitting: Internal Medicine

## 2023-11-16 MED ORDER — ESCITALOPRAM OXALATE 10 MG PO TABS
10.0000 mg | ORAL_TABLET | Freq: Every day | ORAL | 0 refills | Status: DC
  Filled 2023-11-16: qty 30, 30d supply, fill #0

## 2023-12-13 ENCOUNTER — Other Ambulatory Visit: Payer: Self-pay | Admitting: Medical Genetics

## 2023-12-13 DIAGNOSIS — Z006 Encounter for examination for normal comparison and control in clinical research program: Secondary | ICD-10-CM

## 2023-12-24 ENCOUNTER — Other Ambulatory Visit: Payer: Self-pay

## 2023-12-24 ENCOUNTER — Encounter: Payer: Self-pay | Admitting: Internal Medicine

## 2023-12-24 ENCOUNTER — Other Ambulatory Visit (HOSPITAL_COMMUNITY): Payer: Self-pay

## 2023-12-24 ENCOUNTER — Ambulatory Visit: Admitting: Internal Medicine

## 2023-12-24 ENCOUNTER — Other Ambulatory Visit: Payer: Self-pay | Admitting: Internal Medicine

## 2023-12-24 VITALS — BP 124/70 | HR 65 | Temp 97.6°F | Ht 63.5 in | Wt 204.2 lb

## 2023-12-24 DIAGNOSIS — K760 Fatty (change of) liver, not elsewhere classified: Secondary | ICD-10-CM | POA: Diagnosis not present

## 2023-12-24 DIAGNOSIS — J683 Other acute and subacute respiratory conditions due to chemicals, gases, fumes and vapors: Secondary | ICD-10-CM

## 2023-12-24 DIAGNOSIS — R5383 Other fatigue: Secondary | ICD-10-CM

## 2023-12-24 DIAGNOSIS — E669 Obesity, unspecified: Secondary | ICD-10-CM | POA: Diagnosis not present

## 2023-12-24 DIAGNOSIS — R7303 Prediabetes: Secondary | ICD-10-CM | POA: Diagnosis not present

## 2023-12-24 DIAGNOSIS — E039 Hypothyroidism, unspecified: Secondary | ICD-10-CM

## 2023-12-24 DIAGNOSIS — F32A Depression, unspecified: Secondary | ICD-10-CM

## 2023-12-24 DIAGNOSIS — Z0000 Encounter for general adult medical examination without abnormal findings: Secondary | ICD-10-CM

## 2023-12-24 MED ORDER — FLUTICASONE PROPIONATE 50 MCG/ACT NA SUSP
1.0000 | Freq: Every day | NASAL | 0 refills | Status: DC
  Filled 2023-12-24: qty 16, 60d supply, fill #0

## 2023-12-24 MED ORDER — ESCITALOPRAM OXALATE 10 MG PO TABS
10.0000 mg | ORAL_TABLET | Freq: Every day | ORAL | 0 refills | Status: DC
  Filled 2023-12-24: qty 30, 30d supply, fill #0

## 2023-12-24 MED ORDER — ESCITALOPRAM OXALATE 10 MG PO TABS
10.0000 mg | ORAL_TABLET | Freq: Every day | ORAL | 1 refills | Status: AC
  Filled 2023-12-24 – 2024-01-04 (×2): qty 90, 90d supply, fill #0
  Filled 2024-03-24: qty 90, 90d supply, fill #1

## 2023-12-24 MED ORDER — BUPROPION HCL ER (SR) 100 MG PO TB12
100.0000 mg | ORAL_TABLET | Freq: Two times a day (BID) | ORAL | 1 refills | Status: DC
  Filled 2023-12-24: qty 180, 90d supply, fill #0

## 2023-12-24 MED ORDER — LEVOTHYROXINE SODIUM 25 MCG PO TABS
12.5000 ug | ORAL_TABLET | Freq: Every day | ORAL | 0 refills | Status: DC
  Filled 2023-12-24: qty 45, 90d supply, fill #0

## 2023-12-24 NOTE — Progress Notes (Deleted)
 Subjective:  Patient ID: Samantha Raymond, female    DOB: 11-15-1960  Age: 63 y.o. MRN: [id number]  CC: Diagnoses of Prediabetes, Fatty liver disease, nonalcoholic, and Acquired hypothyroidism were pertinent to this visit.   HPI Samantha Raymond presents for  Chief Complaint  Patient presents with  . Medical Management of Chronic Issues   No complaints :  loves her job ,  is less stressful.  Residential move has reunited her with old friend and only 3 minutes to work .  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   and playing soccer  missed 2.5 yrs of schooling but not held back so .  Other grandchildren doing well including 2 yr old adopted twins who live in [place], [place] ,  mother is 47 and doing well in [place]   . Only conflct is with sister in [place]  .  Obesity:  following a careful diet, staying active not not exercising   Has had RSV AND PNEUMONIA VACCINE   dISUCSSED CONTRave    Outpatient Medications Prior to Visit  Medication Sig Dispense Refill  . albuterol  (VENTOLIN  HFA) 108 (90 Base) MCG/ACT inhaler Inhale 2 puffs into the lungs every 6 hours as needed for wheezing or shortness of breath. 6.7 g 3  . escitalopram  (LEXAPRO ) 10 MG tablet Take 1 tablet (10 mg total) by mouth daily. 30 tablet 0  . fluticasone  (FLONASE ) 50 MCG/ACT nasal spray Place 1 spray into both nostrils daily. 16 g 0  . fluticasone -salmeterol (ADVAIR ) 500-50 MCG/ACT AEPB INHALE 1 PUFF 2 TIMES DAILY INTO THE LUNGS. RINSE MOUTH AFTER EACH USE TO PREVENT THRUSH 60 each 5  . levothyroxine  (SYNTHROID ) 25 MCG tablet Take 0.5 tablets (12.5 mcg total) by mouth daily before breakfast. 90 tablet 0  . ondansetron  (ZOFRAN ) 4 MG tablet Take 1 tablet (4 mg total) by mouth every 8 (eight) hours as needed for nausea or vomiting. 20 tablet 0  . tretinoin  (RETIN-A ) 0.025 % cream Apply a pea size amount to completely dry skin every other night with a moisturizer on top, increasing to nightly as  tolerated. 20 g 4  . buPROPion  ER (WELLBUTRIN  SR) 100 MG 12 hr tablet Take 1 tablet (100 mg total) by mouth 2 (two) times daily. (Patient not taking: Reported on 12/24/2023) 180 tablet 1  . cetirizine  (ZYRTEC ) 10 MG tablet Take 1 tablet (10 mg total) by mouth daily. (Patient not taking: Reported on 12/24/2023) 90 tablet 1  . clobetasol  (TEMOVATE ) 0.05 % external solution Apply to affected area on scalp once daily as needed for itchy symptoms. 50 mL 1  . tirzepatide  (ZEPBOUND ) 5 MG/0.5ML Pen Inject 5 mg into the skin once a week. 2 mL 2   No facility-administered medications prior to visit.    Review of Systems;  Patient denies headache, fevers, malaise, unintentional weight loss, skin rash, eye pain, sinus congestion and sinus pain, sore throat, dysphagia,  hemoptysis , cough, dyspnea, wheezing, chest pain, palpitations, orthopnea, edema, abdominal pain, nausea, melena, diarrhea, constipation, flank pain, dysuria, hematuria, urinary  Frequency, nocturia, numbness, tingling, seizures,  Focal weakness, Loss of consciousness,  Tremor, insomnia, depression, anxiety, and suicidal ideation.      Objective:  BP 124/70   Pulse 65   Temp 97.6 F (36.4 C) (Oral)   Ht 5' 3.5 (1.613 m)   Wt 204 lb 3.2 oz (92.6 kg)   SpO2 99%   BMI 35.61 kg/m   BP Readings from Last 3 Encounters:  12/24/23  124/70  12/22/22 120/74  08/06/22 122/64    Wt Readings from Last 3 Encounters:  12/24/23 204 lb 3.2 oz (92.6 kg)  06/11/23 202 lb (91.6 kg)  12/22/22 205 lb 9.6 oz (93.3 kg)    Physical Exam Vitals reviewed.  Constitutional:      General: She is not in acute distress.    Appearance: Normal appearance. She is normal weight. She is not ill-appearing, toxic-appearing or diaphoretic.  HENT:     Head: Normocephalic.  Eyes:     General: No scleral icterus.       Right eye: No discharge.        Left eye: No discharge.     Conjunctiva/sclera: Conjunctivae normal.  Cardiovascular:     Rate and Rhythm:  Normal rate and regular rhythm.     Heart sounds: Normal heart sounds.  Pulmonary:     Effort: Pulmonary effort is normal. No respiratory distress.     Breath sounds: Normal breath sounds.  Musculoskeletal:        General: Normal range of motion.  Skin:    General: Skin is warm and dry.  Neurological:     General: No focal deficit present.     Mental Status: She is alert and oriented to person, place, and time. Mental status is at baseline.  Psychiatric:        Mood and Affect: Mood normal.        Behavior: Behavior normal.        Thought Content: Thought content normal.        Judgment: Judgment normal.    Lab Results  Component Value Date   HGBA1C 5.7 (H) 06/14/2023   HGBA1C 5.9 12/22/2022   HGBA1C 6.2 12/11/2021    Lab Results  Component Value Date   CREATININE 1.00 06/14/2023   CREATININE 0.91 12/22/2022   CREATININE 0.88 12/11/2021    Lab Results  Component Value Date   WBC 6.0 08/06/2022   HGB 14.0 08/06/2022   HCT 42.6 08/06/2022   PLT 260.0 08/06/2022   GLUCOSE 97 06/14/2023   CHOL 197 06/14/2023   TRIG 106 06/14/2023   HDL 58 06/14/2023   LDLDIRECT 120 (H) 06/14/2023   LDLCALC 120 (H) 06/14/2023   ALT 22 06/14/2023   AST 23 06/14/2023   NA 142 06/14/2023   K 5.0 06/14/2023   CL 105 06/14/2023   CREATININE 1.00 06/14/2023   BUN 15 06/14/2023   CO2 23 06/14/2023   TSH 0.979 06/14/2023   HGBA1C 5.7 (H) 06/14/2023    MM 3D SCREENING MAMMOGRAM BILATERAL BREAST Result Date: 04/28/2023 CLINICAL DATA:  Screening. EXAM: DIGITAL SCREENING BILATERAL MAMMOGRAM WITH TOMOSYNTHESIS AND CAD TECHNIQUE: Bilateral screening digital craniocaudal and mediolateral oblique mammograms were obtained. Bilateral screening digital breast tomosynthesis was performed. The images were evaluated with computer-aided detection. COMPARISON:  Previous exam(s). ACR Breast Density Category b: There are scattered areas of fibroglandular density. FINDINGS: There are no findings suspicious  for malignancy. IMPRESSION: No mammographic evidence of malignancy. A result letter of this screening mammogram will be mailed directly to the patient. RECOMMENDATION: Screening mammogram in one year. (Code:SM-B-01Y) BI-RADS CATEGORY  1: Negative. Electronically Signed   By: Debby Satterfield M.D.   On: 04/28/2023 15:20    Assessment & Plan:  .Prediabetes  Fatty liver disease, nonalcoholic  Acquired hypothyroidism     I spent 34 minutes on the day of this face to face encounter reviewing patient's  most recent visit with cardiology,  nephrology,  and neurology,  prior relevant surgical and non surgical procedures, recent  labs and imaging studies, counseling on weight management,  reviewing the assessment and plan with patient, and post visit ordering and reviewing of  diagnostics and therapeutics with patient  .   Follow-up: No follow-ups on file.   Verneita LITTIE Kettering, MD

## 2023-12-24 NOTE — Patient Instructions (Signed)
 I'm sorry that your insurance will not cover Zepbound   or Wegovy .  You are not alone; many insurance companies are doing the same.  I am offering  an alternative medication called Contrave that is effective for helping  emotional eaters  lose or maintain previous weight loss. It is available for $100/month through a mail order pharmacy .  If you are interested in starting this medication, please set up an account using the QR code on the sheet I gave you.     The bottle I gave you is for the first week of tablets.  Don't start until you notify me so I can send the rx to [facility] pharmacy .  One tablet in AM daily for one week,   The prescription you will receive via mail order will begin the titration:   One tablet twice daily for week 2; 2 tablets in AM and 1 tablet in PM for week 3;  2 tablets twice daily Week 4

## 2023-12-25 ENCOUNTER — Encounter: Payer: Self-pay | Admitting: Internal Medicine

## 2023-12-25 LAB — THYROID PANEL WITH TSH
Free Thyroxine Index: 2.7 (ref 1.4–3.8)
T3 Uptake: 28 % (ref 22–35)
T4, Total: 9.5 ug/dL (ref 5.1–11.9)
TSH: 0.69 m[IU]/L (ref 0.40–4.50)

## 2023-12-25 MED ORDER — NALTREXONE-BUPROPION HCL ER 8-90 MG PO TB12: ORAL_TABLET | ORAL | 0 refills | Status: DC

## 2023-12-25 NOTE — Assessment & Plan Note (Signed)
 She has been asymptomatic .  Using advair  prn, and denies any recent flares.   Has receives RSV  vaccine.

## 2023-12-25 NOTE — Progress Notes (Signed)
 Patient ID: Samantha Raymond, female    DOB: Sep 06, 1960  Age: 63 y.o. MRN: [id number]  The patient is here for annual preventive examination and management of other chronic and acute problems.   The risk factors are reflected in the social history.   The roster of all physicians providing medical care to patient - is listed in the Snapshot section of the chart.   Activities of daily living:  The patient is 100% independent in all ADLs: dressing, toileting, feeding as well as independent mobility   Home safety : The patient has smoke detectors in the home. They wear seatbelts.  There are no unsecured firearms at home. There is no violence in the home.    There is no risks for hepatitis, STDs or HIV. There is no   history of blood transfusion. They have no travel history to infectious disease endemic areas of the world.   The patient has seen their dentist in the last six month. They have seen their eye doctor in the last year. The patinet  denies slight hearing difficulty with regard to whispered voices and some television programs.  They have deferred audiologic testing in the last year.  They do not  have excessive sun exposure. Discussed the need for sun protection: hats, long sleeves and use of sunscreen if there is significant sun exposure.    Diet: the importance of a healthy diet is discussed. They do have a healthy diet.   The benefits of regular aerobic exercise were discussed. The patient  exercises  3 to 5 days per week  for  60 minutes.    Depression screen: there are no signs or vegative symptoms of depression- irritability, change in appetite, anhedonia, sadness/tearfullness.   The following portions of the patient's history were reviewed and updated as appropriate: allergies, current medications, past family history, past medical history,  past surgical history, past social history  and problem list.   Visual acuity was not assessed per patient preference since the patient  has regular follow up with an  ophthalmologist. Hearing and body mass index were assessed and reviewed.    During the course of the visit the patient was educated and counseled about appropriate screening and preventive services including : fall prevention , diabetes screening, nutrition counseling, colorectal cancer screening, and recommended immunizations.    Chief Complaint:  GAD/depression:  improved mood,  :  loves her job ,  is less stressful.  Residential move has reunited her with old friend and only 3 minutes to work .  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   and playing soccer  missed 2.5 yrs of schooling but not held back so .  Other grandchildren doing well including 2 yr old adopted twins who live in [place], [place] ,  mother is 80 and doing well in [place]   . Only conflct is with sister in [place]  .  Obesity:  following a careful diet, staying active not not exercising . No longer using Wegovy  due to cost.  Reviewed risks and benefits of Contrave   Has had RSV AND PNEUMONIA VACCINE       Review of Symptoms  Patient denies headache, fevers, malaise, unintentional weight loss, skin rash, eye pain, sinus congestion and sinus pain, sore throat, dysphagia,  hemoptysis , cough, dyspnea, wheezing, chest pain, palpitations, orthopnea, edema, abdominal pain, nausea, melena, diarrhea, constipation, flank pain, dysuria, hematuria, urinary  Frequency, nocturia, numbness, tingling, seizures,  Focal weakness, Loss of consciousness,  Tremor, insomnia, depression, anxiety, and suicidal ideation.    Physical Exam:  BP 124/70   Pulse 65   Temp 97.6 F (36.4 C) (Oral)   Ht 5' 3.5 (1.613 m)   Wt 204 lb 3.2 oz (92.6 kg)   SpO2 99%   BMI 35.61 kg/m    Physical Exam Vitals reviewed.  Constitutional:      General: She is not in acute distress.    Appearance: Normal appearance. She is normal weight. She is not ill-appearing, toxic-appearing or diaphoretic.  HENT:     Head:  Normocephalic.  Eyes:     General: No scleral icterus.       Right eye: No discharge.        Left eye: No discharge.     Conjunctiva/sclera: Conjunctivae normal.  Cardiovascular:     Rate and Rhythm: Normal rate and regular rhythm.     Heart sounds: Normal heart sounds.  Pulmonary:     Effort: Pulmonary effort is normal. No respiratory distress.     Breath sounds: Normal breath sounds.  Musculoskeletal:        General: Normal range of motion.  Skin:    General: Skin is warm and dry.  Neurological:     General: No focal deficit present.     Mental Status: She is alert and oriented to person, place, and time. Mental status is at baseline.  Psychiatric:        Mood and Affect: Mood normal.        Behavior: Behavior normal.        Thought Content: Thought content normal.        Judgment: Judgment normal.     Assessment and Plan: Other fatigue -     CBC with Differential/Platelet  Prediabetes -     LDL cholesterol, direct -     Lipid panel -     Hemoglobin A1c  Fatty liver disease, nonalcoholic -     Comprehensive metabolic panel with GFR  Acquired hypothyroidism -     Thyroid  Panel With TSH; Future  Visit for preventive health examination Assessment & Plan: age appropriate education and counseling updated, referrals for preventative services and immunizations addressed, dietary and smoking counseling addressed, most recent labs reviewed.  I have personally reviewed and have noted:   1) the patient's medical and social history 2) The pt's use of alcohol, tobacco, and illicit drugs 3) The patient's current medications and supplements 4) Functional ability including ADL's, fall risk, home safety risk, hearing and visual impairment 5) Diet and physical activities 6) Evidence for depression or mood disorder 7) The patient's height, weight, and BMI have been recorded in the chart.     I have made referrals, and provided counseling and education based on review of the above     Reactive airways dysfunction syndrome, moderate persistent, uncomplicated (HCC) Assessment & Plan: She has been asymptomatic .  Using advair  prn, and denies any recent flares.   Has receives RSV  vaccine.    Obesity (BMI 30-39.9) Assessment & Plan: She has been unable to maintain weigh loss without medication   and the GLP1 agonists are cost prohibitive Given samples of Contrave to start    Other orders -     Escitalopram  Oxalate; Take 1 tablet (10 mg total) by mouth daily.  Dispense: 90 tablet; Refill: 1    Return in about 6 months (around 06/22/2024).  Verneita LITTIE Kettering, MD

## 2023-12-25 NOTE — Assessment & Plan Note (Signed)
 She has been unable to maintain weigh loss without medication   and the GLP1 agonists are cost prohibitive Given samples of Contrave to start

## 2023-12-25 NOTE — Assessment & Plan Note (Signed)

## 2023-12-26 ENCOUNTER — Ambulatory Visit: Payer: Self-pay | Admitting: Internal Medicine

## 2023-12-26 LAB — LDL CHOLESTEROL, DIRECT: Direct LDL: 131 mg/dL — ABNORMAL HIGH (ref <100)

## 2023-12-26 LAB — COMPREHENSIVE METABOLIC PANEL WITH GFR
AG Ratio: 2.1 (calc) (ref 1.0–2.5)
ALT: 24 U/L (ref 6–29)
AST: 28 U/L (ref 10–35)
Albumin: 4.7 g/dL (ref 3.6–5.1)
Alkaline phosphatase (APISO): 83 U/L (ref 37–153)
BUN: 12 mg/dL (ref 7–25)
CO2: 26 mmol/L (ref 20–32)
Calcium: 9.5 mg/dL (ref 8.6–10.4)
Chloride: 101 mmol/L (ref 98–110)
Creat: 0.7 mg/dL (ref 0.50–1.05)
Globulin: 2.2 g/dL (ref 1.9–3.7)
Glucose, Bld: 79 mg/dL (ref 65–99)
Potassium: 4.3 mmol/L (ref 3.5–5.3)
Sodium: 138 mmol/L (ref 135–146)
Total Bilirubin: 0.6 mg/dL (ref 0.2–1.2)
Total Protein: 6.9 g/dL (ref 6.1–8.1)
eGFR: 97 mL/min/1.73m2 (ref >=60)

## 2023-12-26 LAB — CBC WITH DIFFERENTIAL/PLATELET
Absolute Lymphocytes: 1736 {cells}/uL (ref 850–3900)
Absolute Monocytes: 420 {cells}/uL (ref 200–950)
Basophils Absolute: 39 {cells}/uL (ref 0–200)
Basophils Relative: 0.7 %
Eosinophils Absolute: 67 {cells}/uL (ref 15–500)
Eosinophils Relative: 1.2 %
HCT: 44.2 % (ref 35.0–45.0)
Hemoglobin: 14.9 g/dL (ref 11.7–15.5)
MCH: 30.6 pg (ref 27.0–33.0)
MCHC: 33.7 g/dL (ref 32.0–36.0)
MCV: 90.8 fL (ref 80.0–100.0)
MPV: 10.3 fL (ref 7.5–12.5)
Monocytes Relative: 7.5 %
Neutro Abs: 3338 {cells}/uL (ref 1500–7800)
Neutrophils Relative %: 59.6 %
Platelets: 241 Thousand/uL (ref 140–400)
RBC: 4.87 Million/uL (ref 3.80–5.10)
RDW: 12.7 % (ref 11.0–15.0)
Total Lymphocyte: 31 %
WBC: 5.6 Thousand/uL (ref 3.8–10.8)

## 2023-12-26 LAB — LIPID PANEL
Cholesterol: 206 mg/dL — ABNORMAL HIGH (ref <200)
HDL: 69 mg/dL (ref >=50)
LDL Cholesterol (Calc): 118 mg/dL — ABNORMAL HIGH
Non-HDL Cholesterol (Calc): 137 mg/dL — ABNORMAL HIGH (ref <130)
Total CHOL/HDL Ratio: 3 (calc) (ref <5.0)
Triglycerides: 89 mg/dL (ref <150)

## 2023-12-26 LAB — HEMOGLOBIN A1C
Hgb A1c MFr Bld: 6 % — ABNORMAL HIGH (ref <5.7)
Mean Plasma Glucose: 126 mg/dL
eAG (mmol/L): 7 mmol/L

## 2023-12-27 ENCOUNTER — Other Ambulatory Visit: Payer: Self-pay

## 2023-12-27 ENCOUNTER — Other Ambulatory Visit: Payer: Self-pay | Admitting: Internal Medicine

## 2023-12-28 ENCOUNTER — Other Ambulatory Visit: Payer: Self-pay

## 2024-01-03 ENCOUNTER — Encounter: Payer: Self-pay | Admitting: Internal Medicine

## 2024-01-03 NOTE — Telephone Encounter (Signed)
 They are requesting direction clarification. The form has been placed in the quick sign folder.

## 2024-01-04 ENCOUNTER — Other Ambulatory Visit: Payer: Self-pay | Admitting: Internal Medicine

## 2024-01-04 ENCOUNTER — Other Ambulatory Visit: Payer: Self-pay

## 2024-01-04 DIAGNOSIS — J4521 Mild intermittent asthma with (acute) exacerbation: Secondary | ICD-10-CM

## 2024-01-04 MED ORDER — FLUTICASONE-SALMETEROL 500-50 MCG/ACT IN AEPB
1.0000 | INHALATION_SPRAY | Freq: Two times a day (BID) | RESPIRATORY_TRACT | 5 refills | Status: AC
  Filled 2024-01-04: qty 60, 30d supply, fill #0
  Filled 2024-02-28: qty 60, 30d supply, fill #1

## 2024-01-04 NOTE — Telephone Encounter (Signed)
 Last filled by Dr. Hope on 07/29/2022 Last OV: 12/24/2023 Next OV: 01/10/2024

## 2024-01-04 NOTE — Telephone Encounter (Signed)
 Prescription Request  01/04/2024  LOV: 12/24/2023  What is the name of the medication or equipment? fluticasone -salmeterol (ADVAIR ) 500-50 MCG/ACT   Have you contacted your pharmacy to request a refill? No   Which pharmacy would you like this sent to?    [facility] [address] [place] [address] Phone: [phone] Fax: [phone]  he clinical staff for review and that they should receive a response within 2 business days.   Please advise at Mobile [phone] (mobile)   Pt called back to the office requesting for this medication

## 2024-01-05 MED ORDER — NALTREXONE-BUPROPION HCL ER 8-90 MG PO TB12: ORAL_TABLET | ORAL | 0 refills | Status: DC

## 2024-01-10 ENCOUNTER — Ambulatory Visit: Admitting: Internal Medicine

## 2024-01-10 ENCOUNTER — Encounter: Payer: Self-pay | Admitting: Internal Medicine

## 2024-01-10 VITALS — BP 118/72 | HR 69 | Ht 63.5 in | Wt 205.0 lb

## 2024-01-10 DIAGNOSIS — E66811 Obesity, class 1: Secondary | ICD-10-CM | POA: Diagnosis not present

## 2024-01-10 DIAGNOSIS — R0683 Snoring: Secondary | ICD-10-CM

## 2024-01-10 NOTE — Progress Notes (Unsigned)
 Subjective:  Patient ID: Samantha Raymond, female    DOB: April 12, 1960  Age: 63 y.o. MRN: [id number]  CC: There were no encounter diagnoses.   HPI [name] presents for  Chief Complaint  Patient presents with   Medical Management of Chronic Issues    Discuss need for sleep study    63 yr old female with history of obesity presents with reports of recurrent loud  snoring and apneic events reported by husband .  She reports daytime somnolence that starts  around 2-3:00 In the afternoon.    Uses a sleep number bed, has tried raising the head of the bed, no change.  She is requesting a  home sleep study for further evaluation  ObesityL  she began the first week of Contrave  several days ago      Outpatient Medications Prior to Visit  Medication Sig Dispense Refill   albuterol  (VENTOLIN  HFA) 108 (90 Base) MCG/ACT inhaler Inhale 2 puffs into the lungs every 6 hours as needed for wheezing or shortness of breath. 6.7 g 3   cetirizine  (ZYRTEC ) 10 MG tablet Take 1 tablet (10 mg total) by mouth daily. (Patient not taking: Reported on 12/24/2023) 90 tablet 1   clobetasol  (TEMOVATE ) 0.05 % external solution Apply to affected area on scalp once daily as needed for itchy symptoms. 50 mL 1   escitalopram  (LEXAPRO ) 10 MG tablet Take 1 tablet (10 mg total) by mouth daily. 90 tablet 1   fluticasone  (FLONASE ) 50 MCG/ACT nasal spray Place 1 spray into both nostrils daily. 16 g 0   fluticasone -salmeterol (ADVAIR ) 500-50 MCG/ACT AEPB INHALE 1 PUFF 2 TIMES DAILY INTO THE LUNGS. RINSE MOUTH AFTER EACH USE TO PREVENT THRUSH 60 each 5   Naltrexone -buPROPion  HCl ER 8-90 MG TB12 One tablet every morning for one week, then twice daily for one week, then two tablets in the am and 1 tablet in the pm for one week,  then 2 tablets twice daily 120 tablet 0   ondansetron  (ZOFRAN ) 4 MG tablet Take 1 tablet (4 mg total) by mouth every 8 (eight) hours as needed for nausea or vomiting. 20 tablet 0    tretinoin  (RETIN-A ) 0.025 % cream Apply a pea size amount to completely dry skin every other night with a moisturizer on top, increasing to nightly as tolerated. 20 g 4   No facility-administered medications prior to visit.    Review of Systems;  Patient denies headache, fevers, malaise, unintentional weight loss, skin rash, eye pain, sinus congestion and sinus pain, sore throat, dysphagia,  hemoptysis , cough, dyspnea, wheezing, chest pain, palpitations, orthopnea, edema, abdominal pain, nausea, melena, diarrhea, constipation, flank pain, dysuria, hematuria, urinary  Frequency, nocturia, numbness, tingling, seizures,  Focal weakness, Loss of consciousness,  Tremor, insomnia, depression, anxiety, and suicidal ideation.      Objective:  BP 118/72   Pulse 69   Ht 5' 3.5 (1.613 m)   Wt 205 lb (93 kg)   SpO2 97%   BMI 35.74 kg/m   BP Readings from Last 3 Encounters:  01/10/24 118/72  12/24/23 124/70  12/22/22 120/74    Wt Readings from Last 3 Encounters:  01/10/24 205 lb (93 kg)  12/24/23 204 lb 3.2 oz (92.6 kg)  06/11/23 202 lb (91.6 kg)    Physical Exam  Lab Results  Component Value Date   HGBA1C 6.0 (H) 12/24/2023   HGBA1C 5.7 (H) 06/14/2023   HGBA1C 5.9 12/22/2022    Lab Results  Component Value Date  CREATININE 0.70 12/24/2023   CREATININE 1.00 06/14/2023   CREATININE 0.91 12/22/2022    Lab Results  Component Value Date   WBC 5.6 12/24/2023   HGB 14.9 12/24/2023   HCT 44.2 12/24/2023   PLT 241 12/24/2023   GLUCOSE 79 12/24/2023   CHOL 206 (H) 12/24/2023   TRIG 89 12/24/2023   HDL 69 12/24/2023   LDLDIRECT 131 (H) 12/24/2023   LDLCALC 118 (H) 12/24/2023   ALT 24 12/24/2023   AST 28 12/24/2023   NA 138 12/24/2023   K 4.3 12/24/2023   CL 101 12/24/2023   CREATININE 0.70 12/24/2023   BUN 12 12/24/2023   CO2 26 12/24/2023   TSH 0.69 12/24/2023   HGBA1C 6.0 (H) 12/24/2023    MM 3D SCREENING MAMMOGRAM BILATERAL BREAST Result Date:  04/28/2023 CLINICAL DATA:  Screening. EXAM: DIGITAL SCREENING BILATERAL MAMMOGRAM WITH TOMOSYNTHESIS AND CAD TECHNIQUE: Bilateral screening digital craniocaudal and mediolateral oblique mammograms were obtained. Bilateral screening digital breast tomosynthesis was performed. The images were evaluated with computer-aided detection. COMPARISON:  Previous exam(s). ACR Breast Density Category b: There are scattered areas of fibroglandular density. FINDINGS: There are no findings suspicious for malignancy. IMPRESSION: No mammographic evidence of malignancy. A result letter of this screening mammogram will be mailed directly to the patient. RECOMMENDATION: Screening mammogram in one year. (Code:SM-B-01Y) BI-RADS CATEGORY  1: Negative. Electronically Signed   By: Debby Satterfield M.D.   On: 04/28/2023 15:20    Assessment & Plan:  .There are no diagnoses linked to this encounter.   I spent 34 minutes on the day of this face to face encounter reviewing patient's  most recent visit with cardiology,  nephrology,  and neurology,  prior relevant surgical and non surgical procedures, recent  labs and imaging studies, counseling on weight management,  reviewing the assessment and plan with patient, and post visit ordering and reviewing of  diagnostics and therapeutics with patient  .   Follow-up: No follow-ups on file.   Verneita LITTIE Kettering, MD

## 2024-01-11 DIAGNOSIS — R0683 Snoring: Secondary | ICD-10-CM | POA: Insufficient documentation

## 2024-01-11 NOTE — Assessment & Plan Note (Signed)
 With obesity and daytime sleepiness reported.  Home sleep study ordered to rule out OSA

## 2024-01-14 ENCOUNTER — Other Ambulatory Visit (HOSPITAL_COMMUNITY): Payer: Self-pay

## 2024-01-19 ENCOUNTER — Telehealth: Payer: Self-pay

## 2024-01-19 ENCOUNTER — Other Ambulatory Visit (HOSPITAL_COMMUNITY): Payer: Self-pay

## 2024-01-19 ENCOUNTER — Telehealth (HOSPITAL_COMMUNITY): Payer: Self-pay

## 2024-01-19 MED ORDER — CONTRAVE 8-90 MG PO TB12
ORAL_TABLET | ORAL | 0 refills | Status: DC
  Filled 2024-01-19 – 2024-01-24 (×3): qty 120, 30d supply, fill #0

## 2024-01-19 NOTE — Addendum Note (Signed)
 Addended by: MARYLYNN VERNEITA CROME on: 01/19/2024 07:34 AM   Modules accepted: Orders

## 2024-01-19 NOTE — Telephone Encounter (Signed)
 My chart message sent

## 2024-01-19 NOTE — Telephone Encounter (Signed)
 PA request has been Submitted. New Encounter has been or will be created for follow up. For additional info see Pharmacy Prior Auth telephone encounter from 01/19/2024.

## 2024-01-19 NOTE — Telephone Encounter (Signed)
 Pharmacy Patient Advocate Encounter   Received notification from Pt Calls Messages that prior authorization for Contrave  8-90MG  er tablets  is required/requested.   Insurance verification completed.   The patient is insured through [facility].   Per test claim: PA required; PA submitted to above mentioned insurance via Latent Key/confirmation #/EOC [id number] Status is pending

## 2024-01-19 NOTE — Telephone Encounter (Signed)
 PA request has been Received. New Encounter has been or will be created for follow up. For additional info see Pharmacy Prior Auth telephone encounter from 01/19/24.

## 2024-01-20 DIAGNOSIS — G4733 Obstructive sleep apnea (adult) (pediatric): Secondary | ICD-10-CM | POA: Diagnosis not present

## 2024-01-20 DIAGNOSIS — R0602 Shortness of breath: Secondary | ICD-10-CM | POA: Diagnosis not present

## 2024-01-24 ENCOUNTER — Other Ambulatory Visit: Payer: Self-pay

## 2024-01-24 ENCOUNTER — Other Ambulatory Visit (HOSPITAL_COMMUNITY): Payer: Self-pay

## 2024-01-24 NOTE — Telephone Encounter (Signed)
 Pharmacy Patient Advocate Encounter  Received notification from [employer] that Prior Authorization for Contrave  8-90MG  er tablets  has been APPROVED from 01/21/24 to 05/20/24. Ran test claim, Copay is $24.97. This test claim was processed through [facility]- copay amounts may vary at other pharmacies due to pharmacy/plan contracts, or as the patient moves through the different stages of their insurance plan.   PA #/Case ID/Reference #: [id number]

## 2024-01-25 ENCOUNTER — Telehealth: Payer: Self-pay

## 2024-01-25 NOTE — Telephone Encounter (Signed)
 Sleep study results placed in yellow folder for review.

## 2024-01-26 DIAGNOSIS — G4733 Obstructive sleep apnea (adult) (pediatric): Secondary | ICD-10-CM | POA: Insufficient documentation

## 2024-01-26 NOTE — Assessment & Plan Note (Signed)
 The patient has mild to moderate sleep apnea   by December 2025  2 night sleep study.  CPAP will be ordered

## 2024-01-26 NOTE — Telephone Encounter (Signed)
 Pt is aware and gave a verbal understanding.

## 2024-01-27 LAB — GENECONNECT MOLECULAR SCREEN: Genetic Analysis Overall Interpretation: POSITIVE — AB

## 2024-01-28 ENCOUNTER — Telehealth: Payer: Self-pay | Admitting: Medical Genetics

## 2024-01-28 DIAGNOSIS — Z1501 Genetic susceptibility to malignant neoplasm of breast: Secondary | ICD-10-CM

## 2024-01-31 DIAGNOSIS — Z1509 Genetic susceptibility to other malignant neoplasm: Secondary | ICD-10-CM | POA: Insufficient documentation

## 2024-01-31 NOTE — Telephone Encounter (Signed)
 [place] [facility] Positive Result Note 01/31/2024 11:52 AM  SECOND ATTEMPT: Confirmed I was speaking with Samantha Raymond [id number] by using name and DOB. Informed participant the reason for this call is to provide results for the above study. Results revealed Lynch Syndrome. Genetic counseling was offered and participant requests a call back to schedule. All questions were answered, and participant was thanked for their time and support of the above study. Participant was encouraged to contact [facility] if they have any further questions or concerns.

## 2024-02-01 ENCOUNTER — Other Ambulatory Visit: Payer: Self-pay | Admitting: Medical Genetics

## 2024-02-01 ENCOUNTER — Other Ambulatory Visit (HOSPITAL_COMMUNITY): Payer: Self-pay

## 2024-02-01 ENCOUNTER — Telehealth: Payer: Self-pay | Admitting: Medical Genetics

## 2024-02-01 DIAGNOSIS — Z1509 Genetic susceptibility to other malignant neoplasm: Secondary | ICD-10-CM

## 2024-02-01 NOTE — Telephone Encounter (Signed)
 [place] [facility] 02/01/2024 1:16 PM  Confirmed I was speaking with Samantha Raymond [id number] by using name and DOB. Genetic counseling was offered and participant is scheduled. All questions were answered, and participant was thanked for their time and support of the above study. Participant was encouraged to contact [facility] if they have any further questions or concerns.    Jordyn Pennstrom, BS [place]  [facility] Clinical Research Specialist II Direct Dial: [phone]  Fax: [phone]

## 2024-02-22 ENCOUNTER — Encounter: Payer: Self-pay | Admitting: Genetic Counselor

## 2024-02-22 ENCOUNTER — Ambulatory Visit (INDEPENDENT_AMBULATORY_CARE_PROVIDER_SITE_OTHER): Admitting: Genetic Counselor

## 2024-02-22 DIAGNOSIS — Z1509 Genetic susceptibility to other malignant neoplasm: Secondary | ICD-10-CM

## 2024-02-22 DIAGNOSIS — Z1501 Genetic susceptibility to malignant neoplasm of breast: Secondary | ICD-10-CM

## 2024-02-22 DIAGNOSIS — Z803 Family history of malignant neoplasm of breast: Secondary | ICD-10-CM

## 2024-02-22 NOTE — Progress Notes (Addendum)
 PRIMARY PROVIDER:  Marylynn Verneita CROME, MD  PRIMARY REASON FOR VISIT:  1. PMS2 gene mutation positive   2. Family history of malignant neoplasm of breast     [name], a 64 y.o. female, was seen for a [place] genetic counseling as follow up to her participation in the [place] Study, part of the [facility] Research Program. 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   RELEVANT MEDICAL HISTORY:  [name] is a 64 y.o. female with no personal history of cancer.    Mammogram last completed 04/2023, density B. Report premature ovarian failure and menopause at age 25. Ovaries and uterus intact. Colonoscopy 05/2020, normal.   Past Medical History:  Diagnosis Date   Allergy    Asthma    Fatty liver    hypothyroid    Infertility, female    Menopause    OSA (obstructive sleep apnea) 01/26/2024   Prediabetes    Premature ovarian failure    Recurrent urinary tract infection    SOBOE (shortness of breath on exertion)     Past Surgical History:  Procedure Laterality Date   CESAREAN SECTION     COLONOSCOPY      Social History   Socioeconomic History   Marital status: Married    Spouse name: Not on file   Number of children: Not on file   Years of education: Not on file   Highest education level: Associate degree: academic program  Occupational History   Occupation: Community Education Officer    Employer:   Tobacco Use   Smoking status: Never   Smokeless tobacco: Never  Vaping Use   Vaping status: Never Used  Substance and Sexual Activity   Alcohol use: Yes    Alcohol/week: 1.0 standard drink of alcohol    Types: 1 Standard drinks or equivalent per week    Comment: Rarely during the week or at home. More socially   Drug use: No   Sexual activity: Not Currently    Birth control/protection: Other-see comments     Comment: Post Menopause  Other Topics Concern   Not on file  Social History Narrative   Not on file   Social Drivers of Health   Tobacco Use: Low Risk (01/10/2024)   Patient History    Smoking Tobacco Use: Never    Smokeless Tobacco Use: Never    Passive Exposure: Not on file  Financial Resource Strain: Low Risk (12/20/2023)   Overall Financial Resource Strain (CARDIA)    Difficulty of Paying Living Expenses: Not hard at all  Food Insecurity: No Food Insecurity (12/20/2023)   Epic    Worried About Programme Researcher, Broadcasting/film/video in the Last Year: Never true    Ran Out of Food in the Last Year: Never true  Transportation Needs: No Transportation Needs (12/20/2023)   Epic    Lack of Transportation (Medical): No    Lack of Transportation (Non-Medical): No  Physical Activity: Insufficiently Active (12/20/2023)   Exercise Vital Sign    Days of Exercise per Week: 4 days    Minutes of Exercise per Session: 20 min  Stress: No Stress Concern Present (12/20/2023)   [employer] of Occupational Health - Occupational Stress Questionnaire    Feeling of Stress: Not at all  Social Connections: Socially Integrated (12/20/2023)   Social Connection and Isolation Panel    Frequency of Communication  with Friends and Family: More than three times a week    Frequency of Social Gatherings with Friends and Family: Twice a week    Attends Religious Services: More than 4 times per year    Active Member of Clubs or Organizations: Yes    Attends Banker Meetings: More than 4 times per year    Marital Status: Married  Depression (PHQ2-9): Low Risk (01/10/2024)   Depression (PHQ2-9)    PHQ-2 Score: 0  Alcohol Screen: Low Risk (12/20/2023)   Alcohol Screen    Last Alcohol Screening Score (AUDIT): 3  Housing: Low Risk (12/20/2023)   Epic    Unable to Pay for Housing in the Last Year: No    Number of Times Moved in the Last Year: 1    Homeless in the Last Year: No  Utilities: Not on file  Health  Literacy: Not on file     FAMILY HISTORY:  We obtained a detailed, 3-generation family history.  Significant diagnoses are listed below: Family History  Problem Relation Age of Onset   Asthma Mother    Obesity Mother    Diabetes Father    Heart disease Father        chf,  no history of AMI   Hyperlipidemia Father    Hypertension Father    Kidney disease Father    Arthritis Sister    Breast cancer Sister 75       Second primary at 40, not hereditary genetic testing around 2015   Stroke Maternal Aunt 88 - 21   Cancer Maternal Aunt 36 - 59       GI cancer   Alcohol abuse Maternal Grandfather    Breast cancer Maternal Aunt 59 - 69   Arthritis Sister    Asthma Brother    Cancer Grandson        neuroblastoma   Colon cancer Neg Hx    Colon polyps Neg Hx    Esophageal cancer Neg Hx    Rectal cancer Neg Hx    Stomach cancer Neg Hx     [name] is aware of previous family history of genetic testing, reporting her sister had genetic testing around 10 years ago and her breast cancer was determined to be not hereditary. Report was not available for review.   [name] has fraternal twin daughters, age 18. One daughter, has two children with a son diagnosed with a neuroblastoma, living at age 17. Her other daughter has had three miscarriages and two adopted children. Her sister, age 102, was diagnosed with breast cancer at age 100 and a second primary breast cancer at 52. Genetic testing reported to be normal, but was not available for review. Genes included in test unknown. She has another sister and brother, living in their 71s, no cancer. Their children have not been diagnosed with cancer. Her mother is living at 59, no cancer history. Her maternal aunt is living at 74, breast cancer in her late 80s. She has another maternal aunt that was diagnosed with cancer of the intestine in her 31s, strokes in her 49s, deceased. No maternal cousins reported with cancer. No cancer reported  for maternal grandparents. [name] father passed away at age 71 from complications of diabetes. He had one half-sister, no history of cancer for her or her children. No cancer reported for paternal grandmother, grandfather's history is unknown.    [name] otherwise does not report a family history of genetic conditions, heart disease, birth defects,  intellectual disability or developmental delay.     GENETIC TEST RESULTS:  [name] participated in the [place] GeneConnect population genomic screening program. 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 There were no pathogenic or likely pathogenic variants detected in other genes reported on in this study.   [facility] Population Screen is a screening test that analyzes 11 genes related to hereditary breast and ovarian cancer syndrome (HBOC), Lynch syndrome, and familial hypercholesterolemia. These include APOB, BRCA1, BRCA2, EPCAM, LDLR, LDLRAP1, PCSK9, PMS2 (excluding exons 11-15), MLH1, MSH2, MSH6. This test reports pathogenic and likely pathogenic variants, but does not report on variants of unknown significance. The absence of any additional pathogenic variants is reassuring, but does not rule out a hereditary condition. There are other variants and genes associated with heart disease, hereditary cancer and other inherited conditions that were not included in this test. Please see full test report in labs, labeled GeneConnect Molecular Screen, dated 01/01/24. A section of the report is included below.     CLINICAL INFORMATION:  Pathogenic variants or mutations in PMS2 are associated with a condition called Lynch syndrome. Lynch syndrome is caused by a pathogenic variant in one of five genes (MLH1, MSH2, MSH6, PMS2, EPCAM) and increases risk for colorectal, endometrial, ovarian, urinary tract, and other cancers. The cancer risks and management recommendations  associated with PMS2 variants are listed below:  Type of Cancer PMS2 Variant Lifetime Risk Average Lifetime Risk  Colorectal 8.7-20% 4.1%  Endometrial (uterus) 13-26% 3.1%   Other Lynch syndrome-associated cancers have been observed in individuals with PMS2 pathogenic variants, but it is unclear if individuals with PMS2 variants have an increased risk for these cancers compared to the general population.   Benign and malignant skin tumors such as sebaceous adenomas, sebaceous adenocarcinomas, and keratoacanthomas has been reported to be increased among patients with Lynch syndrome. Cumulative lifetime risk specific to PMS2 carriers is not available.  Risk numbers are based on NCCN v1.2025. Risk estimates may evolve over time as new information is learned about PMS2 variants.    Management Recommendations: Per NCCN v1.2025, Genetic/Familial High Risk Assessment: Colorectal, Endometrial and Gastric   Colorectal Cancer Screening: High quality colonoscopy at age 28-35 (or 2-5 years prior to the earliest colon cancer if it is diagnosed before age 74) and repeat every 1-3 years Consider using daily aspirin to reduce the risk of colorectal cancer. The decision to use aspirin should be made on an individual basis, including discussion of individual risks, benefits, adverse effects, and childbearing plans with a healthcare provider.   Endometrial Cancer Screening/Risk Reduction: Women should report any abnormal uterine bleeding or postmenopausal bleeding. The evaluation of these symptoms should include an endometrial biopsy. A hysterectomy may be considered. The timing should be individualized based on whether childbearing is complete, comorbidities, and family history. Hysterectomy can be considered at age 62y and some women may consider removing their ovaries with surgery at the same time  Endometrial cancer screening does not have a proven benefit in women with Lynch Syndrome. However, endometrial  biopsy is highly sensitive and specific as a diagnostic procedure. Screening via endometrial biopsy every 1-2 years starting at age 65-35 can be considered. Transvaginal ultrasounds may be considered in postmenopausal women at their clinician's discretion.  Additional Considerations: Patients of reproductive age should be made aware of options for prenatal diagnosis and assisted reproduction including pre-implantation genetic diagnosis. Individuals with a single pathogenic PMS2 variant are also carriers  of constitutional mismatch repair deficiency (CMMRD) syndrome. CMMRD is a childhood-onset cancer predisposition syndrome that can present with hematological malignancies, cancers of the brain and central nervous system, Lynch syndrome-associated cancers (colon, uterine, small bowel, urinary tract), embryonic tumors, and sarcomas. Some affected individuals may also display cafe-au-lait macules. For there to be a risk of CMMRD in offspring, an individual and their partner would each have to have a single pathogenic variant in the same MMR gene; in such a case, the risk of having an affected child is 25%.  This information is based on current understanding of the gene and may change in the future.    IMPLICATIONS FOR FAMILY MEMBERS: Individuals with PMS2 are encouraged to share information about their genetic test results with family members. A family letter will be provided to help share this result with relatives. [facility] screening is a systematic process through which additional family members with a PMS2 variant are identified, starting with all first degree relatives over the age of 56 (parents, siblings, and children) of people who have a PMS2 variant.   Hereditary predisposition to cancer due to pathogenic variants in the PMS2 gene has autosomal dominant inheritance. This means that an individual with a pathogenic variant has a 50% chance of passing the condition on to their children. Identification  of a pathogenic variant allows for the recognition of at-risk relatives who can pursue testing for the familial variant.  Family members are encouraged to consider genetic testing for this familial pathogenic variant. As there are generally no childhood cancer risks associated with pathogenic variants in the PMS2 gene, individuals in the family are not typically recommended to have testing until they reach at least 65 years of age. They may contact our office at [phone] for more information or to schedule an appointment. Family members who live outside of the area are encouraged to find a genetic counselor in their area by visiting: [email or web address].  RESOURCES: Facing Our Risk of Cancer Empowered (FORCE): www.facingourrisk.org   Colon Cancer Alliance for Research and Education for Lynch Syndrome: snitchseek.be  [facility] Inherited Cancer Registry: https://inheritedcancer.net/  ADDITIONAL TESTING: [name] does meet NCCN criteria for additional testing, however we discussed that her sister, diagnosed with two primary breast cancers would be the best person to have testing to determine hereditary breast cancer risk. Encouraged sister to consider update genetic testing.   PLAN:  Previously referred to Gastroenterology is recommended to further discuss options for colon cancer screening and prevention.   Referral to [facility] Gynecologic Oncology is recommended to further discuss options for gynecologic cancer prevention. Referral placed.    Results will be shared with [name] primary care provider, Tullo, Teresa L, MD, for further management.   Genetic test results should be shared with relatives, who can consider undergoing genetic testing for the PMS2  pathogenic variant.   Lastly, we encouraged [name] to remain in contact with [facility]'s genetics team so that we can continuously update the family history and inform her of  any changes in our understanding of Lynch syndrome and any additional genetic testing that may be of benefit for this family.   [name] questions were answered to her satisfaction today. Our contact information was provided should additional questions or concerns arise.   Burnard Ogren, MS, [facility] Licensed, [email or web address] phone: [phone]  45 minutes were spent on the date of the encounter in service to the patient including preparation, face-to-face consultation, documentation and care coordination.   The patient  was seen alone.    I connected with  [name] on 02/22/2024 at 8:30 am EDT by MyChart video conference and verified that I am speaking with the correct person using two identifiers.   Patient location: work Provider location: [place]  __________________________________________________________________ For [employer]:  Number of people involved in session: 1 Was an Writer involved with case: no

## 2024-02-23 ENCOUNTER — Other Ambulatory Visit: Payer: Self-pay

## 2024-02-23 ENCOUNTER — Telehealth: Payer: Self-pay

## 2024-02-23 NOTE — Telephone Encounter (Signed)
 Spoke with [name] regarding her referral to GYN oncology. She has an appointment scheduled with Dr. Rogelio on 1/28 at 3:00. Patient agrees to date and time. She has been provided with office address and location. She is also aware of our mask and visitor policy. Patient verbalized understanding and will call with any questions.

## 2024-02-23 NOTE — Telephone Encounter (Signed)
 LVM for Samantha Raymond to call the office in regards to a referral our office received from a genetics counselor. Available date is 1/28 with Dr.Jackson-Moore

## 2024-02-28 ENCOUNTER — Other Ambulatory Visit: Payer: Self-pay

## 2024-02-28 ENCOUNTER — Other Ambulatory Visit: Payer: Self-pay | Admitting: Internal Medicine

## 2024-02-28 ENCOUNTER — Other Ambulatory Visit (HOSPITAL_COMMUNITY): Payer: Self-pay

## 2024-02-28 MED ORDER — FLUTICASONE PROPIONATE 50 MCG/ACT NA SUSP
1.0000 | Freq: Every day | NASAL | 0 refills | Status: AC
  Filled 2024-02-28: qty 16, 60d supply, fill #0

## 2024-02-28 MED ORDER — CONTRAVE 8-90 MG PO TB12: ORAL_TABLET | ORAL | 2 refills | Status: DC

## 2024-02-28 NOTE — Telephone Encounter (Signed)
 Refilled: 01/19/2024 Last OV: 01/10/2024 Next OV: 05/18/2024

## 2024-02-29 ENCOUNTER — Other Ambulatory Visit: Payer: Self-pay

## 2024-03-01 ENCOUNTER — Other Ambulatory Visit: Payer: Self-pay | Admitting: Internal Medicine

## 2024-03-01 ENCOUNTER — Other Ambulatory Visit (HOSPITAL_COMMUNITY): Payer: Self-pay

## 2024-03-15 ENCOUNTER — Encounter: Payer: Self-pay | Admitting: Internal Medicine

## 2024-03-15 ENCOUNTER — Encounter: Payer: Self-pay | Admitting: Obstetrics & Gynecology

## 2024-03-15 ENCOUNTER — Inpatient Hospital Stay: Attending: Obstetrics & Gynecology | Admitting: Obstetrics & Gynecology

## 2024-03-15 VITALS — BP 139/64 | HR 60 | Temp 98.6°F | Resp 19 | Ht 64.0 in | Wt 209.0 lb

## 2024-03-15 DIAGNOSIS — Z1504 Genetic susceptibility to malignant neoplasm of endometrium: Secondary | ICD-10-CM | POA: Insufficient documentation

## 2024-03-15 DIAGNOSIS — Z1501 Genetic susceptibility to malignant neoplasm of breast: Secondary | ICD-10-CM | POA: Insufficient documentation

## 2024-03-15 DIAGNOSIS — Z15068 Genetic susceptibility to other malignant neoplasm of digestive system: Secondary | ICD-10-CM | POA: Diagnosis not present

## 2024-03-15 DIAGNOSIS — Z79890 Hormone replacement therapy (postmenopausal): Secondary | ICD-10-CM | POA: Insufficient documentation

## 2024-03-15 DIAGNOSIS — Z1509 Genetic susceptibility to other malignant neoplasm: Secondary | ICD-10-CM | POA: Diagnosis not present

## 2024-03-15 DIAGNOSIS — J45909 Unspecified asthma, uncomplicated: Secondary | ICD-10-CM | POA: Insufficient documentation

## 2024-03-15 DIAGNOSIS — N809 Endometriosis, unspecified: Secondary | ICD-10-CM | POA: Insufficient documentation

## 2024-03-15 DIAGNOSIS — G4730 Sleep apnea, unspecified: Secondary | ICD-10-CM | POA: Insufficient documentation

## 2024-03-15 DIAGNOSIS — Z1279 Encounter for screening for malignant neoplasm of other genitourinary organs: Secondary | ICD-10-CM | POA: Insufficient documentation

## 2024-03-15 NOTE — Progress Notes (Signed)
 Follow Up Note: Gyn-Onc  Laurice Iglesia [facility] 64 y.o. female  CC: PMS2 gene mutation positive    HPI:  She is seen at the request of Burnard Ogren, Counselor. 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  H/O POI, endometriosis.  Colonoscopy in 4/22 neg.  Distant relative w/a h/o a GI cancer.  No family h/o uterine ca.  Pap testing UTD. Discussed the use of AI scribe software for clinical note transcription with the patient, who gave verbal consent to proceed. Her past medical history includes asthma and mild to moderate sleep apnea, for which she has been using a CPAP machine for approximately 15 days. She has no history of heart issues. She does not smoke, uses alcohol very rarely, and denies recreational drug use. She does not engage in regular exercise but is open to preconditioning before surgery.  She has a history of a C-section for the delivery of twins and has undergone laparoscopic surgery for endometriosis. She is currently taking Contrave , an oral weight loss medication.    Review of Systems  Review of Systems  Constitutional:  Negative for malaise/fatigue and weight loss.  Respiratory:  Negative for shortness of breath and wheezing.   Cardiovascular:  Negative for chest pain and leg swelling.  Gastrointestinal:  Negative for abdominal pain, blood in stool, constipation, nausea and vomiting.  Genitourinary:  Negative for dysuria, frequency, hematuria and urgency.  Musculoskeletal:  Negative for joint pain and myalgias.  Neurological:  Negative for weakness.  Psychiatric/Behavioral:  Negative for depression. The patient does not have insomnia.    Current medications, allergy, social history, past surgical history, past medical history, family history were all reviewed.    Vitals:  BP 139/64 (BP Location: Right Arm, Patient Position: Sitting)   Pulse 60   Temp 98.6 F (37 C) (Oral)   Resp 19   Ht 5' 4 (1.626 m)   Wt 209 lb (94.8 kg)   SpO2 98%    BMI 35.87 kg/m    Physical Exam:  Physical Exam Exam conducted with a chaperone present.  Constitutional:      General: She is not in acute distress. Cardiovascular:     Rate and Rhythm: Normal rate and regular rhythm.  Pulmonary:     Effort: Pulmonary effort is normal.     Breath sounds: Normal breath sounds. No wheezing or rhonchi.  Abdominal:     Palpations: Abdomen is soft.     Tenderness: There is no abdominal tenderness. There is no right CVA tenderness or left CVA tenderness.     Hernia: No hernia is present.  Genitourinary:    General: Normal vulva.     Urethra: No urethral lesion.     Vagina: No lesions. No bleeding Musculoskeletal:     Cervical back: Neck supple.     Right lower leg: No edema.     Left lower leg: No edema.  Lymphadenopathy:     Upper Body:     Right upper body: No supraclavicular adenopathy.     Left upper body: No supraclavicular adenopathy.     Lower Body: No right inguinal adenopathy. No left inguinal adenopathy.  Skin:    Findings: No rash.  Neurological:     Mental Status: She is oriented to person, place, and time.   Endometrial Biopsy Procedure Note  Pre-operative Diagnosis: PMS2 gene positive  Post-operative Diagnosis: same  Indications: See above  Procedure Details   The risks (including infection, bleeding, pain, and uterine perforation) and benefits of  the procedure were explained to the patient and Written informed consent was obtained.     The patient was placed in the dorsal lithotomy position.  Bimanual exam showed the uterus to be in the anteroflexed position.  A Graves' speculum inserted in the vagina, and the cervix prepped with povidone iodine.     A sharp tenaculum was applied to the anterior lip of the cervix for stabilization. The cervical canal was dilated with an os finder.   A sterile uterine sound was used to sound the uterus to a depth of 7.5 cm.  A Pipelle endometrial aspirator was used to sample the endometrium.   Sample was sent for pathologic examination.  Condition: Stable  Complications: None  Plan:  The patient was advised to call for any fever or for prolonged or severe pain or bleeding. She was advised to use OTC analgesics as needed for mild to moderate pain. She was advised to avoid vaginal intercourse for 48 hours or until the bleeding has completely stopped.   Assessment/Plan:   Assessment: The patient is a 64 y.o.  year old with a PMS2 mutation. She was recently diagnosed w/OSA and has started therapy.  The planned procedures are TRH-BSO.  Written consent was obtained for these procedures.  Plan: We reviewed the patient's specific anatomic and functional findings with her, with the assistance of diagrams and handouts.  We reviewed the treatment options including expectant, conservative, medical, and surgical management and she is opting for surgical management.  We reviewed the benefits and risks of each treatment option.   For all procedures, we discussed risks of bleeding, infection, damage to surrounding organs including bowel, bladder, blood vessels, ureters and nerves, need for further surgery, numbness and weakness at any body site and the rarer risks of blood clot, heart attack, pneumonia, death.   We also discussed the possible conversion to laparoscopy or laparotomy.  All her questions were answered and she verbalized understanding.    Pre-operative instructions:  She was instructed to not take Aspirin/NSAIDs x 7days prior to surgery.    Antibiotic/VTE-DVT prophylaxis was ordered as indicated.  She will need to hold the Contrave  for 3d prior to the procedure. She should bring oral appliances on the day of surgery.   Post-operative instructions:  She was given a set of handouts with specific post-operative instructions, including precautions and signs/symptoms for which we would recommend contacting us .   Laboratory testing:   Orders Placed This Encounter  Procedures   US   PELVIC COMPLETE WITH TRANSVAGINAL    Standing Status:   Future    Expiration Date:   03/15/2025    Reason for Exam (SYMPTOM  OR DIAGNOSIS REQUIRED):   PMS2 mutation carrier    Preferred imaging location?:   [facility]     Preoperative clearance/medical evaluation:  She does not require surgical clearance/medical evaluation.    Post-operative follow-up:  A post-operative appointment will be made for 2 weeks following the procedure  I personally spent 30 minutes face-to-face and non-face-to-face in the care of this patient, which includes all pre, intra, and post visit time on the date of service.    [place], MD

## 2024-03-15 NOTE — Patient Instructions (Addendum)
 What is a robotic hysterectomy? Hysterectomy is the surgical removal of a woman's uterus. This surgery can be done through small incisions using a thin, lighted scope with a camera on the end (a laparoscope). This is called a laparoscopic hysterectomy.  In robotic-assisted laparoscopic hysterectomy, the surgeon uses a computer to control the surgical instruments. The computer station is in the operating room. The surgeon is able to control the robot's movements steadily and precisely. This lets him or her get into tiny spaces more easily and have a better view of the operation than with conventional laparoscopic surgery. This procedure continues to be researched to improve the technique.  Why might I need a robotic hysterectomy? Your surgeon may recommend this surgery if you have a problem with your uterus that has not responded to other treatments. Here are some common reasons why a hysterectomy is recommended:  Non-cancerous fibroid tumors Cancerous tumors Uterine prolapse (your uterus has slipped down into your vagina) Endometriosis (cells from the lining of your uterus grow outside your uterus, causing pain and bleeding) Other causes of long-term abnormal uterine bleeding Chronic pelvic pain Some reasons why robotic-assisted laparoscopic hysterectomy may be recommended:  You will have smaller incisions than in an open type of hysterectomy. You may have less pain and a shorter hospital stay after surgery. Your recovery may be easier. Your risk for complications like bleeding or infection may be less. What are the risks of a robotic hysterectomy? Robotic-assisted laparoscopic hysterectomy is a safe procedure, but all surgeries carry some risks. You will need to sign a consent form that explains the risks and benefits of the surgery. You will also want to discuss these risks and benefits with your surgeon. Some potential risks of hysterectomy done by any technique include:  Reaction to the  anesthesia Infection Bleeding Damage to other organs inside the abdomen Blood clots that form in your legs and may travel to your lungs An additional risk of robotic surgery:  Robotic-assisted laparoscopic surgery may take longer than other types of surgery. This means that you are under anesthesia longer. This may increase some of your surgical risks. There may be other risks, depending on your specific medical condition. Be sure to discuss any concerns with your healthcare provider before the procedure.  How do I prepare for a robotic hysterectomy? Sometime before the day of your surgery you will need a physical exam. Your surgeon may also order tests, such as blood tests, a chest X-ray, or an electrocardiogram (ECG) to check on your general health. Be sure to tell your surgeon about any medications you take at home, including herbal supplements and other over-the-counter medications. You may be told to stop taking aspirin or other medications that thins your blood and may increase bleeding.  Other points to go over:  Tell your healthcare provider if you or someone in your family has any history of reaction to general anesthesia. If you smoke, you may have to stop smoking well before surgery. On the day and night before surgery you will be given instructions on when to stop eating and drinking. If you are having general anesthesia, it is common to have nothing to eat or drink after midnight or for at least 8 hours before surgery. Ask your surgeon if you should take your regular medications with a small sip of water on the morning of the procedure. What happens during a robotic hysterectomy? This surgery is usually done under general anesthesia.  So, you will be asleep during surgery. Before  the procedure:  You will have an intravenous line (IV) started so you can receive fluids and medications to make you relaxed and sleepy. If you are having general anesthesia, medication may be given  through the IV to put you to sleep. A tube may be inserted in your throat to give you anesthesia and help you breathe while you are asleep. You may be given antibiotics before surgery. You may have a catheter tube placed into your bladder to drain urine. You may have special stockings placed on your legs to help prevent blood clots. Surgery time may range from 3 to 4 hours.  This is what may happen during the surgery:  Three or 4 small incisions are made near your belly button. Gas may be pumped into your belly to inflate it and give your surgeon a better view and more room to work. The laparoscope is inserted into your abdomen. Other surgical instruments are inserted through the other incisions. Your surgeon attaches the laparoscope and the instruments to the robotic arms of the computer. Your surgeon moves to the control area to remotely control the surgery. Your uterus is cut into small pieces that can be removed through the small incisions. During laparoscopic-assisted vaginal hysterectomy your uterus may be taken out through your vagina. Depending on the reasons for your hysterectomy, the whole uterus may be removed or just the part above the cervix. The tubes and ovaries attached to the uterus may also be removed. After surgery, the incisions are closed with 1 or 2 stitches and covered with small dressings. What happens after a robotic hysterectomy? After surgery, you will be taken to the recovery room to be watched as you recover from the anesthesia. Most people stay in the hospital for a few days. During your hospital stay this is what may happen:  Expect some pain after this procedure. You may be given pain medicine through your IV. Your IV will be removed once you can drink fluids and your bowels are starting to pass gas. You will be given additional pain medicine by mouth or by injection as needed. Once your IV is removed and you are passing gas, you can start a normal diet. Your  bladder catheter will be removed in 1 or 2 days. You may have bleeding from your vagina that requires the use of pads. You will be encouraged to get up and walk as soon as you are able. This helps prevent blood clots from forming in your legs. You may be given medicine that also helps prevent blood clots. Caregivers will help you with your bathroom and wound care. Once you go home, its important to follow all of your surgeon's instructions and keep your follow-up appointments. Take any medications as directed. Some pain during early recovery is normal. Ask your surgeon what medications to take for pain.  Here is what you can expect during recovery at home:  You should be able to gradually return to your normal diet. Your incision areas should be kept dry for a few days. Follow your surgeon's instructions on bathing and dressing care. You may need to go back to have your stitches removed. Keep walking. You should gradually be able to resume normal activities in a few days. Avoid heavy lifting for a few weeks. Ask your surgeon when you can return to specific activities. You may continue to have light bleeding from your vagina for several days. You may be instructed not to put anything into your vagina for up to 8 -  12 weeks. Complete recovery may take anywhere from a few weeks to a few months. Tell your surgeon about any of the following during recovery:  Increasing pain or pain that is not relieved by medication Any drainage, bleeding, redness, or swelling from your incision areas Fever Heavy vaginal bleeding Pain or swelling in your legs Chest pain or shortness of breath In addition to the physical symptoms of recovery, you may have emotional symptoms after this surgery. After hysterectomy you will no longer be able to get pregnant and your periods will stop. Some women experience sadness related to these losses.  If you have had your ovaries removed as part of your surgery, you may notice  symptoms of menopause, such as hot flashes and vaginal dryness. Some women may benefit from hormone therapy after hysterectomy. Discuss this with your doctor.  Next steps Before you agree to the test or the procedure make sure you know:  The name of the test or procedure The reason you are having the test or procedure The risks and benefits of the test or procedure When and where you are to have the test or procedure and who will do it When and how will you get the results How much will you have to pay for the test or procedure  Preparing for Your Surgery You and your healthcare team will work together to prepare for your surgery.  Help us  keep you safe during your surgery by telling us  if any of the following statements apply to you, even if you arent sure.  I take a blood thinner. Some examples are aspirin, heparin, warfarin (Coumadin), clopidogrel (Plavix), enoxaparin (Lovenox), dabigatran (Pradaxa), apixaban (Eliquis), and rivaroxaban (Xarelto). There are others, so be sure your doctor knows all the medications youre taking. I take prescription medications, including patches and creams. I take any over-the-counter medications, herbs, vitamins, minerals, or natural or home remedies. I have a pacemaker, automatic implantable cardioverter-defibrillator (AICD), or other heart device. I have sleep apnea. I have had a problem with anesthesia (medication to make you sleep during surgery) in the past. I am allergic to certain medication(s) or materials, including latex. I am not willing to receive a blood transfusion. I drink alcohol. I smoke. I use recreational drugs. About Drinking Alcohol The amount of alcohol you drink can affect you during and after your surgery. Its important that you talk with your healthcare providers about your alcohol intake so that we can plan your care.  Stopping alcohol suddenly can cause seizures, delirium, and death. If we know youre at risk for  these complications, we can prescribe medication to help prevent them. If you use alcohol regularly, you may be at risk for other complications during and after surgery. These include bleeding, infections, heart problems, and a longer hospital stay. Here are things you can do to prevent problems before your surgery:  Be honest with your healthcare provider about how much alcohol you drink. Try to stop drinking alcohol once your surgery is planned. If you develop a headache, nausea, increased anxiety, or cant sleep after you stop drinking, tell your doctor right away. These are early signs of alcohol withdrawal and can be treated. Tell your healthcare provider if you cant stop drinking. Ask us  any questions you have about drinking and surgery. As always, all of your medical information will be kept confidential. About Smoking People who smoke can have breathing problems when they have surgery. Stopping even for a few days before surgery can help. If you smoke,  your nurse will refer you to our Tobacco Treatment Program.   About Sleep Apnea Sleep apnea is a common breathing disorder that causes a person to stop breathing for short periods of time while sleeping. The most common type is obstructive sleep apnea (OSA). With OSA, the airway becomes completely blocked during sleep. It can cause serious problems during and after surgery.  Please tell us  if you have sleep apnea or if you think you might have it. If you use a breathing machine (such as a CPAP) for sleep apnea, bring it with you the day of your surgery.  About Enhanced Recovery After Surgery (ERAS) ERAS is a program to help you get better faster after your surgery. As part of the ERAS program, its important to do certain things before and after your surgery.  Before your surgery, make sure youre ready by doing the following things:  Read this guide. It will help you know what to expect before, during, and after your surgery. If you have  questions, write them down. You can ask your doctor or nurse at your next appointment, or you can call your doctors office. Exercise and eat a healthy diet. This will help get your body ready for your surgery. After your surgery, help yourself recover more quickly by doing the following things:  Read your recovery pathway. Your nurse will give you a pathway with goals for your recovery. It will help you know what to do and expect on each day during your recovery. Start eating and moving around as soon as you can. The sooner youre able to eat, get out of bed, and walk, the quicker you will be able to get back to your normal activities.  Within 30 Days of Your Surgery Presurgical Testing (PST) Before your surgery, you will have an appointment for presurgical testing (PST). The date, time, and location of your PST appointment will be printed on the appointment reminder from your surgeons office.You can eat and take your usual medications the day of your PST appointment.  During your appointment, you will meet with a nurse practitioner (NP) who works closely with anesthesiology staff (doctors and specialized nurses who will give you medication to put you to sleep during your surgery). Your NP will review your medical and surgical history with you. You will have tests, including an electrocardiogram (EKG) to check your heart rhythm, a chest x-ray, blood tests, and any other tests necessary to plan your care. Your NP may also recommend you see other healthcare providers.  Your NP will talk with you about which medications you should take the morning of your surgery.  Its very helpful if you bring the following with you to your PST appointment:  A list of all the medications you are taking, including patches and creams. Results of any tests done outside of [facility], such as a cardiac stress test, echocardiogram, or carotid doppler study. The name(s) and telephone number(s) of your doctor(s). Complete a  Health Care Proxy Form If you havent already completed a Health Care Proxy form, we recommend you complete one now. A health care proxy is a legal document that identifies the person who will speak for you if youre unable to communicate for yourself. The person you identify is called your health care agent.  If youre interested in completing a Health Care Proxy form, talk with your nurse. If you have completed one already, or if you have any other advance directive, bring it with you to your next appointment.  Do Breathing and Coughing Exercises Practice taking deep breaths and coughing before your surgery. You will be given an incentive spirometer to help expand your lungs. For more information, read How to Use Your Incentive Spirometer. If you have any questions, ask your nurse or respiratory therapist.  Exercise Try to do aerobic exercise every day, such as walking at least 1 mile (1.6 kilometers), swimming, or biking. If its cold outside, use stairs in your home or go to a mall or shopping market. Exercising will help your body get into its best condition for your surgery and make your recovery faster and easier.  Eat a Healthy Diet You should eat a well-balanced, healthy diet before your surgery. If you need help with your diet talk with your doctor or nurse about meeting with a dietitian.  10 Days Before Your Surgery Stop Taking Vitamin E If you take vitamin E, stop taking it 10 days before your surgery, because it can cause bleeding. For more information, read Common Medications Containing Aspirin and Other Nonsteroidal Anti-inflammatory Drugs (NSAIDs).   Hibiclens is a skin cleanser that kills germs for 24 hours after using it Showering with Hibiclens before surgery will help reduce your risk of infection after surgery. You can buy Hibiclens at your local pharmacy without a prescription.  Purchase Supplies for Your Bowel Preparation, if Needed Your surgeon may instruct you to clean  out your bowels before your surgery. Your nurse will tell you how. You will need to purchase the following supplies for your bowel preparation at your local pharmacy. You dont need a prescription.  1 (238-gram) bottle of polyethylene glycol (MiraLAX) 1 (64-ounce) bottle of a clear liquid Your doctor may have sent prescriptions for the following antibiotics to your pharmacy:  Metronidazole (Flagyl, Metrogel) 500 milligram tablets Neomycin  (Neo-Fradin ) 500 milligram tablets Be sure to also pick up these antibiotics, if needed.  This is also a good time to stock up on clear liquids to drink the day before your surgery, if you need to. For a list of clear liquids that you can drink, see the table in this section.  7 Days Before Your Surgery Stop Taking Certain Medications If you take aspirin, ask your surgeon if you should continue. Aspirin and medications that contain aspirin can cause bleeding. For more information, read Common Medications Containing Aspirin and Other Nonsteroidal Anti-inflammatory Drugs (NSAIDs).  Stop Taking Herbal Remedies Stop taking herbal remedies or supplements 7 days before your surgery. If you take a multivitamin, talk with your doctor or nurse about whether you should continue.    2 Days Before Your Surgery Stop Taking Certain Medications Stop taking nonsteroidal anti-inflammatory drugs (NSAIDs) such as ibuprofen (Advil, Motrin) and naproxen (Aleve). These medications can cause bleeding. For more information, read Common Medications Containing Aspirin and Other Nonsteroidal Anti-inflammatory Drugs (NSAIDs).  1 Day Before Your Surgery Note the Time of Your Surgery A clerk from the Admitting Office will call you  the day before your surgery. The clerk will tell you what time you should arrive at the hospital for your surgery.   Follow a soft diet This includes yogurt, mashed potatoes, soup. Avoid gas producing food/beverages I.e., soda, beans, raw  fruits/vegetables.  Follow a clear liquid diet after midnight.  While youre following this diet: Dont eat any solid foods. Try to drink at least 1 (8-ounce) glass of clear liquid every hour while youre awake. Drink different types of clear liquids. Dont just drink water, coffee, and tea. Dont drink sugar-free liquids unless  you have diabetes.  Start Bowel Preparation, if Needed You may also need to do a bowel preparation in order to empty your bowels before surgery. If you need to do this, your nurse will tell you.  The MiraLAX bowel preparation will make you have frequent bowel movements (poop often), so make sure youre near a bathroom the evening before your surgery.  Mix all 238 grams of MiraLAX with the 64 ounces of clear liquid until the MiraLAX powder dissolves. Once the MiraLAX is dissolved, you can put the mixture in the refrigerator, if you want to. At 5:00 pm on the day before your surgery, start drinking the MiraLAX bowel preparation. Drink 1 (8-ounce) glass of the mixture every 15 minutes until the container is empty. After youre finished drinking the MiraLAX bowel preparation, drink 4 to 6 glasses of clear liquids. You can keep drinking clear liquids until midnight or until you go to bed, but you dont have to. Apply petroleum jelly (Vaseline) or A&D ointment to the skin around your anus after every bowel movement. This helps prevent irritation.  At 7:00 pm on the day before your surgery, take your antibiotics as instructed.  At 10:00 pm on the day before your surgery, take your antibiotics as instructed.  Shower With Hibiclens The night before your surgery, shower using Hibiclens. To use Hibiclens, open the bottle and pour some solution into your hand or a washcloth. Move away from the shower stream to avoid rinsing off the Hibiclens too soon. Rub it gently over your body from your neck to your waist and rinse.  Dont let the solution get into your eyes, ears, mouth, or  genital area. Dont use any other soap. Dry yourself off with a clean towel after your shower.  Sleep Go to bed early and get a full nights sleep.  Do not eat after midnight  The Morning of Your Surgery Shower With Hibiclens Shower using Hibiclens just before you leave for the hospital. Use the Hibiclens the same way you did the night before.  Dont use any other soap. Dont put on any lotion, cream, powder, deodorant, makeup, or perfume after your shower.  Take Your Medications as Instructed If your doctor or NP instructed you to take certain medications the morning of your surgery, take only those medications with a sip of water. Depending on what medications you take and the surgery youre having, this may be all, some, or none of your usual morning medications.  Two hours before your scheduled arrival time, you can drink the carbohydrate drink like gatorade your doctor or nurse gave you. After you finish the carbohydrate drink do not eat or drink anything else. This includes water, hard candy, and gum.  Things to Remember Dont put on any lotion, cream, deodorant, makeup, powder, or perfume. Dont wear any metal objects. Remove all jewelry, including body piercings. The equipment used during your surgery can cause burns if it touches metal. Leave valuables, such as credit cards, jewelry, or your checkbook at home. Before youre taken into the operating room, you will need to remove your hearing aids, dentures, prosthetic device(s), wig, and religious articles. Wear something comfortable and loose-fitting. If you wear contact lenses, wear your glasses instead. What to Bring Your breathing machine for sleep apnea (such as your CPAP), if you have one. Your portable music player, if you choose. However, someone will need to hold it for you when you go into surgery. Your incentive spirometer, if you have one. Your Health Care  Proxy Form, if you have completed one. A case for your  personal items, such as eyeglasses, hearing aid(s), dentures, prosthetic device(s), wig, and religious articles. Your healthcare team will use this guide to teach you how to care for yourself after your surgery. .  You will be asked to state and spell your name and date of birth many times. This is for your safety. People with the same or similar names may be having surgery on the same day.  Once Youre in the [facility] Get Dressed for Surgery When its time to change for surgery, you will get a hospital gown, robe, and nonskid socks to wear.  Meet With Your Nurse You will meet with your nurse before surgery. Tell your nurse the dose of any medications (including patches and creams) you took after midnight and the time you took them.  Your nurse may place an intravenous (IV) line into one of your veins, usually in your arm or hand. If your nurse doesnt place the IV, your anesthesiologist will do it later once youre in the operating room.  Meet With Your Anesthesiologist Your anesthesiologist will:  Review your medical history with you. Talk with you about your comfort and safety during your surgery. Talk with you about the kind of anesthesia you will receive. Answer any questions you may have about your anesthesia.  Prepare For Your Surgery Once your nurse has seen you, 1 or 2 visitors can keep you company as you wait for your surgery to begin. When its time for your surgery, your visitor(s) will be shown to the waiting area. Your visitors should read Information for Family and Friends for the Day of Surgery.  You will either walk into the operating room or be taken in on a stretcher. A member of the operating room team will help you onto the operating bed. Compression boots will be placed on your lower legs. These gently inflate and deflate to help circulation in your legs.  Once youre comfortable, your anesthesiologist will give you anesthesia through your IV line and you will fall  asleep. You will also get fluids through your IV line during and after your surgery.  After youre fully asleep, a breathing tube will be placed through your mouth and into your windpipe to help you breathe. You will also have a urinary catheter (Foley) placed to drain urine from your bladder.  Once your surgery is finished, your incision will be closed with staples or with sutures. Steri-StripsTM (thin pieces of tape) will be placed directly on your incision and covered with a bandage. Your breathing tube is usually taken out while youre still in the operating room.  After Your Surgery The information in this section will tell you what to expect after your surgery, both during your hospital stay and after you leave the hospital. You will learn how to safely recover from your surgery. Write down any questions you have and be sure to ask your doctor or nurse.  What to Expect When you wake up after your surgery, you will be in the Post Anesthesia Care Unit (PACU).  You will receive oxygen through a thin tube that rests below your nose called a nasal cannula. A nurse will be monitoring your body temperature, pulse, blood pressure, and oxygen levels.  You may have a urinary catheter in your bladder to help monitor the amount of urine youre making. It should come out before you leave the hospital or PACU. You will also have compression boots on your  lower legs to help your circulation.  You may also have a drain in your abdomen (belly). It allows fluid in the abdomen to drain.  Your pain medication will be given through an IV line or as a pill that you swallow. If youre having pain, tell your nurse.  Your visitors can see you briefly in the PACU, usually within 90 minutes after you arrive there. A member of the nursing staff will explain the guidelines to them.  After your stay in the PACU, you may be discharged or you may be taken to your hospital room in the inpatient unit. There, your nurse  will tell you how to recover from your surgery. Below are examples of ways you can help yourself recover safely.  You will be encouraged to walk with the help of your nurse or physical therapist. We will give you medication to relieve pain. Walking helps reduce the risk for blood clots and pneumonia. It also helps to stimulate your bowels so they begin working again. Use your incentive spirometer. This will help your lungs expand, which prevents pneumonia. For more information, read How to Use Your Incentive Spirometer.  Commonly Asked Questions: During Your Hospital Stay  Will I have pain after my surgery?  Yes, you will have some pain after your surgery, especially in the first few days. Your doctor and nurse will ask you about your pain often. You will be given medication to manage your pain as needed. If your pain is not relieved, please tell your doctor or nurse. Its important to control your pain so you can cough, breathe deeply, use your incentive spirometer, and get out of bed and walk.  Will I be able to eat?  Most people will be able to eat a regular diet or eat as tolerated. You should start with foods that are soft and easy to digest such as apple sauce and chicken noodle soup. Eat small meals frequently, and then advance to regular foods.  If you experience bloating, gas, or cramps, limit high-fiber foods, including whole grain breads and cereal, nuts, seeds, salads, fresh fruit, broccoli, cabbage, and cauliflower.   How long will I be in the hospital?  Anticipate being discharged on the day of surgery if you are meeting the benchmarks for discharge--your pain and nausea are controlled and you are able to empty your bladder.  You may stay overnight.  Depending on the type of surgery you have, you may stay in the hospital for 2 to 5 days.    At Home Will I have pain when I am home?  The length of time each person has pain or discomfort varies. You may still have some pain when  you go home and will probably be taking pain medication. Follow the guidelines below.  Take your medications as directed and as needed. Call your doctor if the medication prescribed for you doesnt relieve your pain. Dont drive or drink alcohol while youre taking prescription pain medication. As your incision heals, you will have less pain and need less pain medication. A mild pain reliever such as acetaminophen  (Tylenol ) or ibuprofen (Advil) will relieve aches and discomfort. However, large quantities of acetaminophen  may be harmful to your liver. Dont take more acetaminophen  than the amount directed on the bottle or as instructed by your doctor or nurse. Pain medication should help you as you resume your normal activities. Take enough medication to do your exercises comfortably. Pain medication is most effective 30 to 45 minutes after taking it.  Keep track of when you take your pain medication. Taking it when your pain first begins is more effective than waiting for the pain to get worse. Pain medication may cause constipation (having fewer bowel movements than what is normal for you).  How do I care for my incision(s)? Your incision(s) will be closed with sutures and surgical glue. .  You should check your incision(s) every day for any signs of infection until your doctor tells you it has healed. Call your doctor if you develop any of the following signs of a wound infection: Redness Swelling Increased pain Warmth at the incision site Foul-smelling or puss-like drainage from your incision A temperature of 101 F (38.3 C) or higher To prevent infection, dont let anyone touch your incision(s). Clean your hands with soap and water or an alcohol-based hand sanitizer before you touch your incision(s).  When can I shower? Shower with Hibiclens until your staples are removed. Gently wash your incision(s) with Hibiclens. If you have Steri-Strips or surgical glue on your incision(s), dont  scrub it or use a washcloth on it. This could irritate your incision(s) and prevent it from healing.  Dont let your incision(s) be wet for too long when you shower. When youre finished with your shower, gently pat your incision(s) with a clean towel. Allow it to air dry completely before getting dressed.  Continue to shower at least once day for 4 weeks after your surgery with a soap like Dove or Ivory.  Dont take tub baths or go swimming for 6 weeks.  What are the most common symptoms after a hysterectomy? Its common for you to have some vaginal spotting or light bleeding, which can occur for about 4 to 6 weeks after your surgery. You should monitor this with a pad or a panty liner. Dont use tampons or place anything in your vagina for 8 weeks. If youre having heavy bleeding, such as if youre bleeding through a pad every 1 to 2 hours, call your doctor right away.  Its also common to have some discomfort after surgery from the air that was pumped into your abdomen (belly) during surgery. To help with this, walk, drink plenty of liquids and make sure to take the stool softeners you received.  When can I resume sexual activity? Dont have vaginal intercourse for 8 weeks after your surgery. Some people will need to wait longer than 8 weeks, so speak with your doctor before resuming sexual intercourse.  How can I prevent constipation? You may experience constipation (trouble passing stool) after your surgery. This is a common side effect of pain medication. Gentle activity, such as walking, and drinking more water can help reduce this side effect.  To avoid constipation, take a stool softener such as docusate sodium (Colace) 3 times a day and 2 tablets of senna (a laxative) at bedtime. Continue taking the stool softener and laxative until you are no longer taking pain medication. Drink plenty of liquids. If you feel bloated, avoid foods that can cause gas, such as beans, broccoli, onions,  cabbage, and cauliflower.  Gas Cramps Changes in your bowel habits (such as frequent bowel movements) If you have soreness around your anus from frequent bowel movements:  Apply zinc oxide ointment (Desitin) to the skin around your anus. This helps prevent irritation. Do not use harsh toilet tissue. You can use a nonalcohol wipe instead. Take medication, if your doctor prescribes it.   When is it safe for me to drive? No driving for 1  week or while taking narcotic analgesics.  Will I be able to travel? Yes, you can travel. If youre traveling by plane within a few weeks after your surgery, make sure you get up and walk every hour. Be sure to stretch your legs, drink plenty of liquids, and keep your feet elevated when possible.  What exercises can I do? Exercise will help you gain strength and feel better. Walking is an excellent form of exercise. Gradually increase the distance you walk.  Dont go running or jogging. Dont do pilates or yoga. Ask your doctor or nurse before starting more strenuous exercises.  When can I lift heavy objects?  Normally, you shouldnt lift anything heavier than 10 pounds (4.5 kilograms) for at least 6 weeks after your surgery.   When can I return to work? The time it takes to return to work depends on the type of work you do, the type of surgery you had, and how fast your body heals. Most people can return to work about 2 to 4 weeks after the surgery.  How can I cope with my feelings? After surgery for a serious illness, you may have new and upsetting feelings. Many people say they felt weepy, sad, worried, nervous, irritable, and angry at one time or another. You may find that you cant control some of these feelings. If this happens, its a good idea to seek emotional support.  The first step in coping is to talk about how you feel. Family and friends can help. Your nurse, doctor, and social worker can reassure, support, and guide you. Its always a  good idea to let these professionals know how you, your family, and your friends are feeling emotionally. Many resources are available to patients and their families. Whether you are in the hospital or at home, the nurses, doctors, and social workers are here to help you and your family and friends handle the emotional aspects of your illness.  When is my first appointment after surgery? Your first appointment after surgery will be 2 to 4 weeks after surgery. Your nurse will give you instructions on how to make this appointment, including the phone number to call. At this appointment, your doctor will discuss your test results with you in detail.  What if I have other questions? If you have any questions or concerns, please talk with your doctor or nurse. You can reach them Monday through Friday from 9:00 am to 5:00 pm.Our office will reach out with a surgery date. We will then send your instructions to you in mychart to review. Please call the office for any questions or concerns.

## 2024-03-16 ENCOUNTER — Other Ambulatory Visit: Payer: Self-pay

## 2024-03-16 ENCOUNTER — Ambulatory Visit (INDEPENDENT_AMBULATORY_CARE_PROVIDER_SITE_OTHER): Admitting: Family Medicine

## 2024-03-16 ENCOUNTER — Encounter: Payer: Self-pay | Admitting: Family Medicine

## 2024-03-16 VITALS — BP 110/60 | HR 66 | Temp 98.0°F | Ht 63.5 in | Wt 210.2 lb

## 2024-03-16 DIAGNOSIS — H10212 Acute toxic conjunctivitis, left eye: Secondary | ICD-10-CM | POA: Diagnosis not present

## 2024-03-16 MED ORDER — CONTRAVE 8-90 MG PO TB12
ORAL_TABLET | ORAL | 2 refills | Status: AC
  Filled 2024-03-16 – 2024-03-17 (×2): qty 360, 90d supply, fill #0

## 2024-03-16 MED ORDER — NEOMYCIN-POLYMYXIN-HC OP SUSP
3.0000 [drp] | Freq: Three times a day (TID) | OPHTHALMIC | 0 refills | Status: DC
  Filled 2024-03-16: qty 7.5, 5d supply, fill #0

## 2024-03-16 MED ORDER — NEOMYCIN-POLYMYXIN-DEXAMETH 3.5-10000-0.1 OP SUSP
2.0000 [drp] | Freq: Four times a day (QID) | OPHTHALMIC | 0 refills | Status: AC
  Filled 2024-03-16: qty 5, 13d supply, fill #0

## 2024-03-16 NOTE — Addendum Note (Signed)
 Addended byBETHA AVELINA NO E on: 03/16/2024 01:28 PM   Modules accepted: Orders

## 2024-03-16 NOTE — Progress Notes (Signed)
 "   Patient ID: Samantha Raymond, female    DOB: 1961/01/18, 64 y.o.   MRN: [id number]  This visit was conducted in person.  BP 110/60   Pulse 66   Temp 98 F (36.7 C) (Temporal)   Ht 5' 3.5 (1.613 m)   Wt 210 lb 4 oz (95.4 kg)   SpO2 96%   BMI 36.66 kg/m    CC:  Chief Complaint  Patient presents with   Conjunctivitis    Squirted soap in eye on Tuesday-Left    Subjective:   HPI: Jasmyn Picha is a 64 y.o. female presenting on 03/16/2024 for Conjunctivitis (Squirted soap in eye on Tuesday-Left)  2 days ago she reports squirting soap in her   left eye.  Tried to flush eye.  Having burning .SABRA Redness, discharge in AM.  Sensitivity to light.   No contacts.   No cahnge in vision.   Used OTC allergy eye drops.. did not help much.     No history of glaucoma.   Relevant past medical, surgical, family and social history reviewed and updated as indicated. Interim medical history since our last visit reviewed. Allergies and medications reviewed and updated. Outpatient Medications Prior to Visit  Medication Sig Dispense Refill   albuterol  (VENTOLIN  HFA) 108 (90 Base) MCG/ACT inhaler Inhale 2 puffs into the lungs every 6 hours as needed for wheezing or shortness of breath. 6.7 g 3   cetirizine  (ZYRTEC ) 10 MG tablet Take 1 tablet (10 mg total) by mouth daily. 90 tablet 1   escitalopram  (LEXAPRO ) 10 MG tablet Take 1 tablet (10 mg total) by mouth daily. 90 tablet 1   fluticasone  (FLONASE ) 50 MCG/ACT nasal spray Place 1 spray into both nostrils daily. 16 g 0   fluticasone -salmeterol (ADVAIR ) 500-50 MCG/ACT AEPB Inhale 1 puff into the lungs 2 (two) times daily. Rinse mouth after each use 60 each 5   levothyroxine  (SYNTHROID ) 25 MCG tablet Take 0.5 mcg by mouth daily before breakfast.     Naltrexone -buPROPion  HCl ER (CONTRAVE ) 8-90 MG TB12 2 tablets  by mouth morning and evening 360 tablet 2   ondansetron  (ZOFRAN ) 4 MG tablet Take 1 tablet (4 mg total) by mouth every 8  (eight) hours as needed for nausea or vomiting. 20 tablet 0   tretinoin  (RETIN-A ) 0.025 % cream Apply a pea size amount to completely dry skin every other night with a moisturizer on top, increasing to nightly as tolerated. 20 g 4   No facility-administered medications prior to visit.     Per HPI unless specifically indicated in ROS section below Review of Systems  Constitutional:  Negative for fatigue and fever.  HENT:  Negative for congestion.   Eyes:  Positive for pain, discharge and redness.  Respiratory:  Negative for cough and shortness of breath.   Cardiovascular:  Negative for chest pain, palpitations and leg swelling.  Gastrointestinal:  Negative for abdominal pain.  Genitourinary:  Negative for dysuria and vaginal bleeding.  Musculoskeletal:  Negative for back pain.  Neurological:  Negative for syncope, light-headedness and headaches.  Psychiatric/Behavioral:  Negative for dysphoric mood.    Objective:  BP 110/60   Pulse 66   Temp 98 F (36.7 C) (Temporal)   Ht 5' 3.5 (1.613 m)   Wt 210 lb 4 oz (95.4 kg)   SpO2 96%   BMI 36.66 kg/m   Wt Readings from Last 3 Encounters:  03/16/24 210 lb 4 oz (95.4 kg)  03/15/24 209 lb (94.8 kg)  01/10/24 205 lb (93 kg)      Physical Exam Constitutional:      General: She is not in acute distress.    Appearance: Normal appearance. She is well-developed. She is not ill-appearing or toxic-appearing.  HENT:     Head: Normocephalic.     Right Ear: Hearing, tympanic membrane, ear canal and external ear normal. Tympanic membrane is not erythematous, retracted or bulging.     Left Ear: Hearing, tympanic membrane, ear canal and external ear normal. Tympanic membrane is not erythematous, retracted or bulging.     Nose: No mucosal edema or rhinorrhea.     Right Sinus: No maxillary sinus tenderness or frontal sinus tenderness.     Left Sinus: No maxillary sinus tenderness or frontal sinus tenderness.     Mouth/Throat:     Pharynx: Uvula  midline.  Eyes:     General: Lids are normal. Lids are everted, no foreign bodies appreciated. Vision grossly intact. Gaze aligned appropriately. No allergic shiner.    Extraocular Movements: Extraocular movements intact.     Conjunctiva/sclera:     Left eye: Left conjunctiva is injected. No chemosis, exudate or hemorrhage.    Pupils: Pupils are equal, round, and reactive to light.  Neck:     Thyroid : No thyroid  mass or thyromegaly.     Vascular: No carotid bruit.     Trachea: Trachea normal.  Cardiovascular:     Rate and Rhythm: Normal rate and regular rhythm.     Pulses: Normal pulses.     Heart sounds: Normal heart sounds, S1 normal and S2 normal. No murmur heard.    No friction rub. No gallop.  Pulmonary:     Effort: Pulmonary effort is normal. No tachypnea or respiratory distress.  Musculoskeletal:     Cervical back: Normal range of motion and neck supple.  Skin:    General: Skin is warm and dry.     Findings: No rash.  Neurological:     Mental Status: She is alert.  Psychiatric:        Mood and Affect: Mood is not anxious or depressed.        Speech: Speech normal.        Behavior: Behavior normal. Behavior is cooperative.        Thought Content: Thought content normal.        Judgment: Judgment normal.       Results for orders placed or performed in visit on 12/24/23  Thyroid  Panel With TSH   Collection Time: 12/24/23  2:56 PM  Result Value Ref Range   T3 Uptake 28 22 - 35 %   T4, Total 9.5 5.1 - 11.9 mcg/dL   Free Thyroxine Index 2.7 1.4 - 3.8   TSH 0.69 0.40 - 4.50 mIU/L  Direct LDL   Collection Time: 12/24/23  3:07 PM  Result Value Ref Range   Direct LDL 131 (H) <100 mg/dL  Lipid panel   Collection Time: 12/24/23  3:07 PM  Result Value Ref Range   Cholesterol 206 (H) <200 mg/dL   HDL 69 > OR = 50 mg/dL   Triglycerides 89 <849 mg/dL   LDL Cholesterol (Calc) 118 (H) mg/dL (calc)   Total CHOL/HDL Ratio 3.0 <5.0 (calc)   Non-HDL Cholesterol (Calc) 137 (H)  <130 mg/dL (calc)  Comprehensive metabolic panel with GFR   Collection Time: 12/24/23  3:07 PM  Result Value Ref Range   Glucose, Bld 79 65 - 99 mg/dL   BUN 12 7 -  25 mg/dL   Creat 9.29 9.49 - 8.94 mg/dL   eGFR 97 > OR = 60 fO/fpw/8.26f7   BUN/Creatinine Ratio SEE NOTE: 6 - 22 (calc)   Sodium 138 135 - 146 mmol/L   Potassium 4.3 3.5 - 5.3 mmol/L   Chloride 101 98 - 110 mmol/L   CO2 26 20 - 32 mmol/L   Calcium 9.5 8.6 - 10.4 mg/dL   Total Protein 6.9 6.1 - 8.1 g/dL   Albumin 4.7 3.6 - 5.1 g/dL   Globulin 2.2 1.9 - 3.7 g/dL (calc)   AG Ratio 2.1 1.0 - 2.5 (calc)   Total Bilirubin 0.6 0.2 - 1.2 mg/dL   Alkaline phosphatase (APISO) 83 37 - 153 U/L   AST 28 10 - 35 U/L   ALT 24 6 - 29 U/L  Hemoglobin A1c   Collection Time: 12/24/23  3:07 PM  Result Value Ref Range   Hgb A1c MFr Bld 6.0 (H) <5.7 %   Mean Plasma Glucose 126 mg/dL   eAG (mmol/L) 7.0 mmol/L  CBC with Differential/Platelet   Collection Time: 12/24/23  3:07 PM  Result Value Ref Range   WBC 5.6 3.8 - 10.8 Thousand/uL   RBC 4.87 3.80 - 5.10 Million/uL   Hemoglobin 14.9 11.7 - 15.5 g/dL   HCT 55.7 64.9 - 54.9 %   MCV 90.8 80.0 - 100.0 fL   MCH 30.6 27.0 - 33.0 pg   MCHC 33.7 32.0 - 36.0 g/dL   RDW 87.2 88.9 - 84.9 %   Platelets 241 140 - 400 Thousand/uL   MPV 10.3 7.5 - 12.5 fL   Neutro Abs 3,338 1,500 - 7,800 cells/uL   Absolute Lymphocytes 1,736 850 - 3,900 cells/uL   Absolute Monocytes 420 200 - 950 cells/uL   Eosinophils Absolute 67 15 - 500 cells/uL   Basophils Absolute 39 0 - 200 cells/uL   Neutrophils Relative % 59.6 %   Total Lymphocyte 31.0 %   Monocytes Relative 7.5 %   Eosinophils Relative 1.2 %   Basophils Relative 0.7 %    Assessment and Plan  Conjunctivitis, chemical, left Assessment & Plan:  Acute, patient did adequate initial flushing of after introduction of soap. No sign of ongoing infection. Recommend saline eyedrops to continue to irrigate eye.  Prescribed steroid eyedrops to soothe if  not continuing to improve.  Will include antibiotics  in eye drops to prevent any resulting bacterial infection.  No vision change or red flags.  Return precautions provided.   Other orders -     Neomycin -Polymyxin-HC; Place 3 drops into the left eye 3 (three) times daily for 5 days.  Dispense: 7.5 mL; Refill: 0    No follow-ups on file.   Greig Ring, MD  "

## 2024-03-16 NOTE — Assessment & Plan Note (Signed)
"   Acute, patient did adequate initial flushing of after introduction of soap. No sign of ongoing infection. Recommend saline eyedrops to continue to irrigate eye.  Prescribed steroid eyedrops to soothe if not continuing to improve.  Will include antibiotics  in eye drops to prevent any resulting bacterial infection.  No vision change or red flags.  Return precautions provided. "

## 2024-03-17 ENCOUNTER — Other Ambulatory Visit: Payer: Self-pay

## 2024-03-17 ENCOUNTER — Other Ambulatory Visit (HOSPITAL_COMMUNITY): Payer: Self-pay

## 2024-03-17 LAB — SURGICAL PATHOLOGY

## 2024-03-21 ENCOUNTER — Encounter: Payer: Self-pay | Admitting: Internal Medicine

## 2024-03-21 ENCOUNTER — Telehealth: Admitting: Internal Medicine

## 2024-03-21 VITALS — Ht 64.0 in | Wt 208.0 lb

## 2024-03-21 DIAGNOSIS — Z6837 Body mass index (BMI) 37.0-37.9, adult: Secondary | ICD-10-CM

## 2024-03-21 DIAGNOSIS — Z1501 Genetic susceptibility to malignant neoplasm of breast: Secondary | ICD-10-CM

## 2024-03-21 NOTE — Assessment & Plan Note (Signed)
 She is at increased risk for endometrial and colon CA.  Amd pssible breast ca .  Agree with robotic hysterectomy/BSO and increased screenig fr colon CA.  Dsicussed eventual referral to High Risk Breast CA clinic ;  she has deferred for now.  Continue annual mammogram

## 2024-03-21 NOTE — Assessment & Plan Note (Signed)
 Managed with Contrave  for the past month

## 2024-03-24 ENCOUNTER — Other Ambulatory Visit: Payer: Self-pay

## 2024-03-24 ENCOUNTER — Other Ambulatory Visit: Payer: Self-pay | Admitting: Internal Medicine

## 2024-03-24 ENCOUNTER — Ambulatory Visit: Admitting: Internal Medicine

## 2024-03-24 MED ORDER — CETIRIZINE HCL 10 MG PO TABS
10.0000 mg | ORAL_TABLET | Freq: Every day | ORAL | 1 refills | Status: AC
  Filled 2024-03-24: qty 90, 90d supply, fill #0

## 2024-03-28 ENCOUNTER — Ambulatory Visit

## 2024-05-18 ENCOUNTER — Ambulatory Visit: Admitting: Internal Medicine

## 2024-12-25 ENCOUNTER — Encounter: Admitting: Internal Medicine
